# Patient Record
Sex: Male | Born: 1970
Health system: Southern US, Community
[De-identification: ages and names within clinical notes are randomized; demographics above are authoritative.]

## PROBLEM LIST (undated history)

## (undated) DIAGNOSIS — J939 Pneumothorax, unspecified: Secondary | ICD-10-CM

## (undated) DIAGNOSIS — I10 Essential (primary) hypertension: Secondary | ICD-10-CM

## (undated) DIAGNOSIS — W3400XA Accidental discharge from unspecified firearms or gun, initial encounter: Secondary | ICD-10-CM

## (undated) DIAGNOSIS — E785 Hyperlipidemia, unspecified: Secondary | ICD-10-CM

## (undated) DIAGNOSIS — L0292 Furuncle, unspecified: Secondary | ICD-10-CM

## (undated) DIAGNOSIS — K921 Melena: Secondary | ICD-10-CM

## (undated) DIAGNOSIS — E119 Type 2 diabetes mellitus without complications: Secondary | ICD-10-CM

## (undated) DIAGNOSIS — F1010 Alcohol abuse, uncomplicated: Secondary | ICD-10-CM

## (undated) HISTORY — DX: Hyperlipidemia, unspecified: E78.5

## (undated) HISTORY — PX: ABDOMINAL SURGERY: SHX537

## (undated) HISTORY — DX: Type 2 diabetes mellitus without complications: E11.9

## (undated) HISTORY — PX: INGUINAL HERNIA REPAIR: SHX194

## (undated) HISTORY — PX: HERNIA REPAIR: SHX51

---
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
## 1990-03-13 DIAGNOSIS — W3400XA Accidental discharge from unspecified firearms or gun, initial encounter: Secondary | ICD-10-CM

## 1990-03-13 HISTORY — DX: Accidental discharge from unspecified firearms or gun, initial encounter: W34.00XA

## 1997-08-25 ENCOUNTER — Emergency Department (HOSPITAL_COMMUNITY): Admission: EM | Admit: 1997-08-25 | Discharge: 1997-08-25 | Payer: Self-pay | Admitting: Emergency Medicine

## 1997-11-23 ENCOUNTER — Inpatient Hospital Stay (HOSPITAL_COMMUNITY): Admission: EM | Admit: 1997-11-23 | Discharge: 1997-11-27 | Payer: Self-pay | Admitting: Emergency Medicine

## 1999-11-18 ENCOUNTER — Encounter: Payer: Self-pay | Admitting: Emergency Medicine

## 1999-11-18 ENCOUNTER — Emergency Department (HOSPITAL_COMMUNITY): Admission: EM | Admit: 1999-11-18 | Discharge: 1999-11-18 | Payer: Self-pay

## 2004-07-26 ENCOUNTER — Emergency Department (HOSPITAL_COMMUNITY): Admission: EM | Admit: 2004-07-26 | Discharge: 2004-07-26 | Payer: Self-pay | Admitting: Emergency Medicine

## 2005-07-12 ENCOUNTER — Encounter: Payer: Self-pay | Admitting: Emergency Medicine

## 2005-07-12 ENCOUNTER — Encounter: Payer: Self-pay | Admitting: Orthopedic Surgery

## 2006-06-13 ENCOUNTER — Emergency Department (HOSPITAL_COMMUNITY): Admission: EM | Admit: 2006-06-13 | Discharge: 2006-06-13 | Payer: Self-pay | Admitting: Emergency Medicine

## 2006-06-29 ENCOUNTER — Emergency Department (HOSPITAL_COMMUNITY): Admission: EM | Admit: 2006-06-29 | Discharge: 2006-06-30 | Payer: Self-pay | Admitting: Emergency Medicine

## 2006-06-29 ENCOUNTER — Emergency Department (HOSPITAL_COMMUNITY): Admission: EM | Admit: 2006-06-29 | Discharge: 2006-06-29 | Payer: Self-pay | Admitting: Emergency Medicine

## 2006-07-18 ENCOUNTER — Emergency Department (HOSPITAL_COMMUNITY): Admission: EM | Admit: 2006-07-18 | Discharge: 2006-07-18 | Payer: Self-pay | Admitting: Emergency Medicine

## 2006-07-22 ENCOUNTER — Emergency Department (HOSPITAL_COMMUNITY): Admission: EM | Admit: 2006-07-22 | Discharge: 2006-07-22 | Payer: Self-pay | Admitting: Emergency Medicine

## 2006-07-23 ENCOUNTER — Emergency Department (HOSPITAL_COMMUNITY): Admission: EM | Admit: 2006-07-23 | Discharge: 2006-07-23 | Payer: Self-pay | Admitting: Emergency Medicine

## 2006-07-29 ENCOUNTER — Emergency Department (HOSPITAL_COMMUNITY): Admission: EM | Admit: 2006-07-29 | Discharge: 2006-07-30 | Payer: Self-pay | Admitting: Emergency Medicine

## 2006-09-02 ENCOUNTER — Emergency Department (HOSPITAL_COMMUNITY): Admission: EM | Admit: 2006-09-02 | Discharge: 2006-09-02 | Payer: Self-pay | Admitting: *Deleted

## 2006-10-02 ENCOUNTER — Emergency Department (HOSPITAL_COMMUNITY): Admission: EM | Admit: 2006-10-02 | Discharge: 2006-10-02 | Payer: Self-pay | Admitting: Emergency Medicine

## 2006-12-17 ENCOUNTER — Emergency Department (HOSPITAL_COMMUNITY): Admission: EM | Admit: 2006-12-17 | Discharge: 2006-12-17 | Payer: Self-pay | Admitting: Emergency Medicine

## 2007-08-04 ENCOUNTER — Emergency Department (HOSPITAL_COMMUNITY): Admission: EM | Admit: 2007-08-04 | Discharge: 2007-08-04 | Payer: Self-pay | Admitting: Emergency Medicine

## 2009-03-13 DIAGNOSIS — L0292 Furuncle, unspecified: Secondary | ICD-10-CM

## 2009-03-13 HISTORY — DX: Furuncle, unspecified: L02.92

## 2009-03-19 IMAGING — CR DG CHEST 2V
2 series · 2 of 2 positions shown · non-contrast
Comparison: 06/29/06.

CLINICAL DATA: Anxiety. 
 CHEST ? 2 VIEW:

[w chest pa]
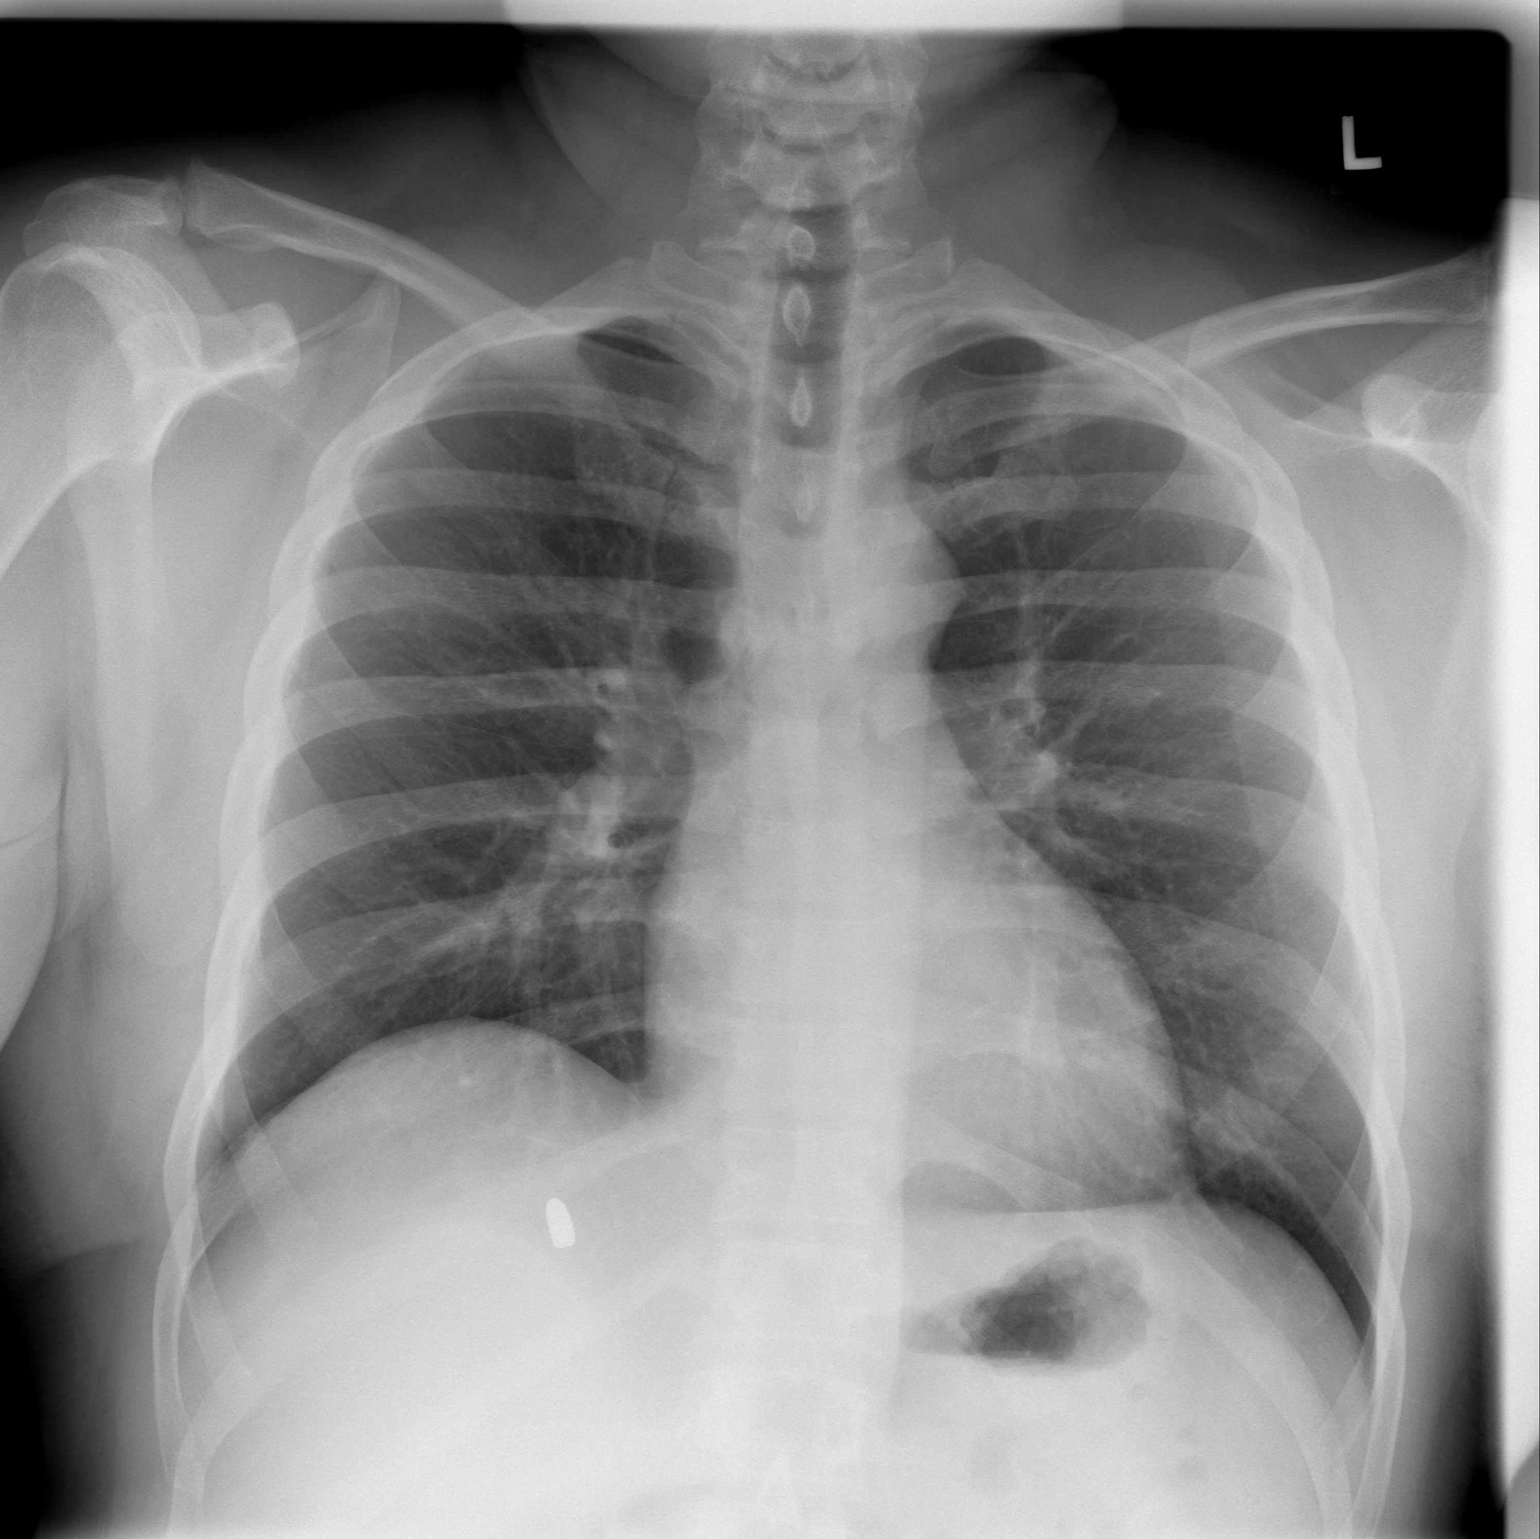

[w chest lat]
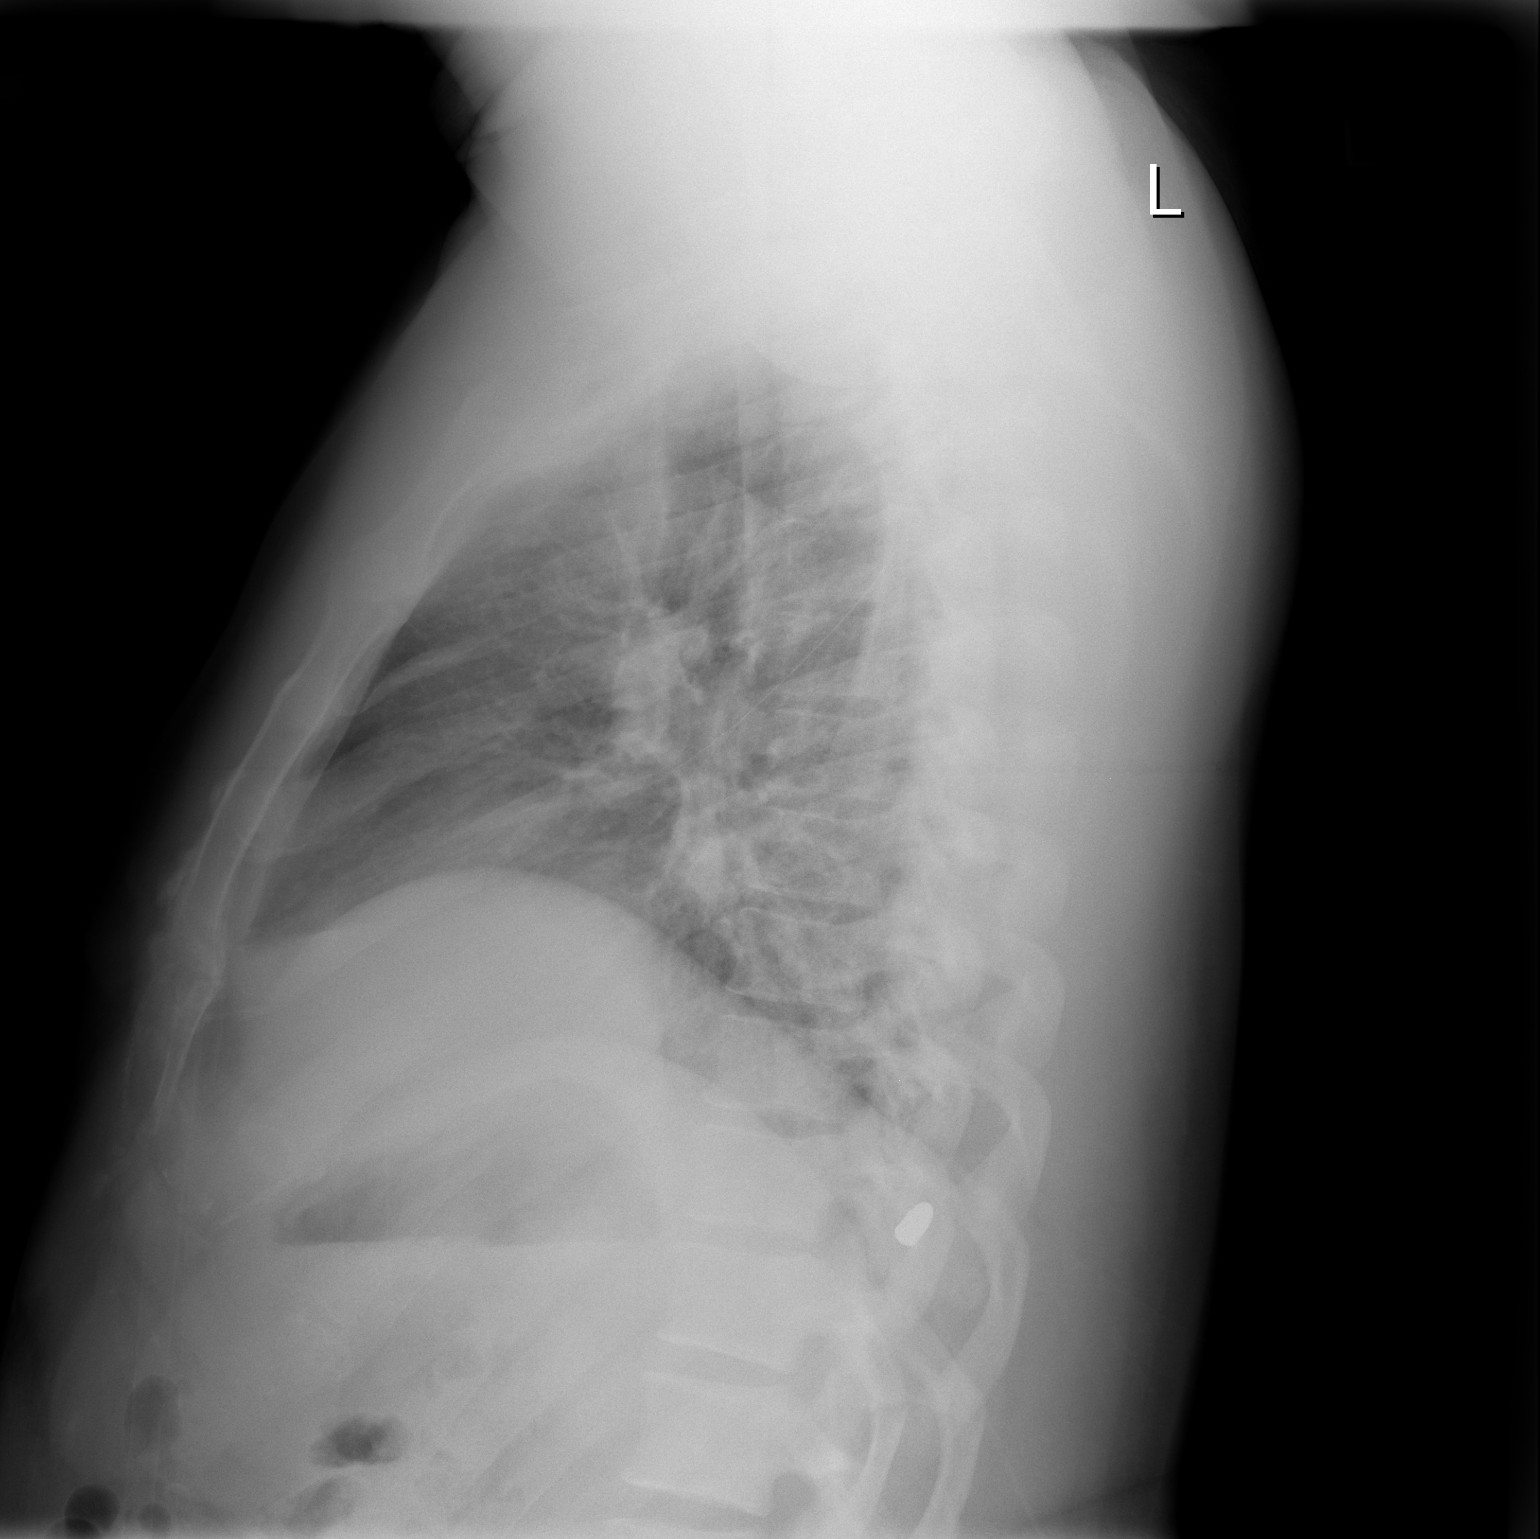

[2 of 2 positions shown; findings below may reference images not displayed]

FINDINGS: Cardiomediastinal silhouette is unremarkable.  Mild peribronchial thickening and elevation of the right hemidiaphragm is stable.  Bullet within the posterior right upper back noted.  Bony thorax is within normal limits.
IMPRESSION: No evidence of acute cardiopulmonary disease.

## 2009-03-28 ENCOUNTER — Ambulatory Visit: Payer: Self-pay | Admitting: Diagnostic Radiology

## 2009-03-28 ENCOUNTER — Emergency Department (HOSPITAL_BASED_OUTPATIENT_CLINIC_OR_DEPARTMENT_OTHER): Admission: EM | Admit: 2009-03-28 | Discharge: 2009-03-28 | Payer: Self-pay | Admitting: Emergency Medicine

## 2009-04-03 ENCOUNTER — Ambulatory Visit: Payer: Self-pay | Admitting: Diagnostic Radiology

## 2009-04-03 ENCOUNTER — Encounter: Payer: Self-pay | Admitting: Emergency Medicine

## 2009-04-03 ENCOUNTER — Ambulatory Visit (HOSPITAL_COMMUNITY): Admission: EM | Admit: 2009-04-03 | Discharge: 2009-04-05 | Payer: Self-pay | Admitting: Emergency Medicine

## 2009-05-16 ENCOUNTER — Emergency Department (HOSPITAL_BASED_OUTPATIENT_CLINIC_OR_DEPARTMENT_OTHER): Admission: EM | Admit: 2009-05-16 | Discharge: 2009-05-16 | Payer: Self-pay | Admitting: Emergency Medicine

## 2009-07-18 ENCOUNTER — Emergency Department (HOSPITAL_BASED_OUTPATIENT_CLINIC_OR_DEPARTMENT_OTHER): Admission: EM | Admit: 2009-07-18 | Discharge: 2009-07-18 | Payer: Self-pay | Admitting: Emergency Medicine

## 2010-05-29 LAB — ANAEROBIC CULTURE

## 2010-05-29 LAB — CULTURE, ROUTINE-ABSCESS

## 2010-05-30 LAB — DIFFERENTIAL
Eosinophils Absolute: 0.2 10*3/uL (ref 0.0–0.7)
Eosinophils Relative: 1 % (ref 0–5)
Eosinophils Relative: 1 % (ref 0–5)
Lymphocytes Relative: 29 % (ref 12–46)
Lymphs Abs: 1.6 10*3/uL (ref 0.7–4.0)
Lymphs Abs: 1.9 10*3/uL (ref 0.7–4.0)
Monocytes Relative: 10 % (ref 3–12)
Monocytes Relative: 14 % — ABNORMAL HIGH (ref 3–12)

## 2010-05-30 LAB — CBC
HCT: 41.5 % (ref 39.0–52.0)
HCT: 45.6 % (ref 39.0–52.0)
Hemoglobin: 15.7 g/dL (ref 13.0–17.0)
MCV: 93.7 fL (ref 78.0–100.0)
Platelets: 251 10*3/uL (ref 150–400)
RBC: 4.43 MIL/uL (ref 4.22–5.81)
RBC: 4.81 MIL/uL (ref 4.22–5.81)
WBC: 11 10*3/uL — ABNORMAL HIGH (ref 4.0–10.5)
WBC: 5.7 10*3/uL (ref 4.0–10.5)

## 2010-05-30 LAB — CULTURE, ROUTINE-ABSCESS

## 2010-05-30 LAB — URINALYSIS, ROUTINE W REFLEX MICROSCOPIC
Glucose, UA: NEGATIVE mg/dL
Hgb urine dipstick: NEGATIVE
Ketones, ur: NEGATIVE mg/dL
pH: 6 (ref 5.0–8.0)

## 2010-05-30 LAB — BASIC METABOLIC PANEL
BUN: 10 mg/dL (ref 6–23)
Chloride: 99 mEq/L (ref 96–112)
Chloride: 99 mEq/L (ref 96–112)
GFR calc Af Amer: >60 mL/min (ref >60)
GFR calc Af Amer: >60 mL/min (ref >60)
GFR calc non Af Amer: >60 mL/min (ref >60)
Potassium: 3.5 mEq/L (ref 3.5–5.1)
Potassium: 3.9 mEq/L (ref 3.5–5.1)
Sodium: 140 mEq/L (ref 135–145)

## 2010-12-26 LAB — I-STAT 8, (EC8 V) (CONVERTED LAB)
Acid-base deficit: 1
Chloride: 105
HCT: 47
Operator id: 277751
Potassium: 3.3 — ABNORMAL LOW

## 2010-12-26 LAB — POCT I-STAT CREATININE: Operator id: 277751

## 2010-12-28 LAB — COMPREHENSIVE METABOLIC PANEL
Albumin: 4
Alkaline Phosphatase: 40
BUN: 9
Calcium: 9.2
Potassium: 3.8
Sodium: 138
Total Protein: 7.2

## 2010-12-28 LAB — RAPID URINE DRUG SCREEN, HOSP PERFORMED
Cocaine: NOT DETECTED
Tetrahydrocannabinol: NOT DETECTED

## 2010-12-28 LAB — ETHANOL

## 2011-10-08 ENCOUNTER — Encounter (HOSPITAL_BASED_OUTPATIENT_CLINIC_OR_DEPARTMENT_OTHER): Payer: Self-pay | Admitting: *Deleted

## 2011-10-08 ENCOUNTER — Emergency Department (HOSPITAL_BASED_OUTPATIENT_CLINIC_OR_DEPARTMENT_OTHER)
Admission: EM | Admit: 2011-10-08 | Discharge: 2011-10-08 | Disposition: A | Discharge status: Home / Self Care | Payer: Managed Care, Other (non HMO) | Attending: Emergency Medicine | Admitting: Emergency Medicine

## 2011-10-08 DIAGNOSIS — I10 Essential (primary) hypertension: Secondary | ICD-10-CM | POA: Insufficient documentation

## 2011-10-08 DIAGNOSIS — M7918 Myalgia, other site: Secondary | ICD-10-CM

## 2011-10-08 DIAGNOSIS — IMO0001 Reserved for inherently not codable concepts without codable children: Secondary | ICD-10-CM | POA: Insufficient documentation

## 2011-10-08 HISTORY — DX: Essential (primary) hypertension: I10

## 2011-10-08 LAB — BASIC METABOLIC PANEL
BUN: 7 mg/dL (ref 6–23)
CO2: 21 mEq/L (ref 19–32)
Chloride: 98 mEq/L (ref 96–112)
GFR calc non Af Amer: >90 mL/min (ref >90)
Glucose, Bld: 120 mg/dL — ABNORMAL HIGH (ref 70–99)
Potassium: 3.3 mEq/L — ABNORMAL LOW (ref 3.5–5.1)

## 2011-10-08 MED ORDER — IBUPROFEN 800 MG PO TABS
800.0000 mg | ORAL_TABLET | Freq: Once | ORAL | Status: AC
  Administered 2011-10-08: 800 mg via ORAL
  Filled 2011-10-08: qty 1

## 2011-10-08 MED ORDER — HYDROCHLOROTHIAZIDE 25 MG PO TABS
25.0000 mg | ORAL_TABLET | Freq: Every day | ORAL | Status: DC
  Administered 2011-10-08: 25 mg via ORAL
  Filled 2011-10-08: qty 1

## 2011-10-08 MED ORDER — CYCLOBENZAPRINE HCL 10 MG PO TABS
10.0000 mg | ORAL_TABLET | Freq: Once | ORAL | Status: AC
  Administered 2011-10-08: 10 mg via ORAL
  Filled 2011-10-08: qty 1

## 2011-10-08 MED ORDER — CYCLOBENZAPRINE HCL 10 MG PO TABS: 10.0000 mg | ORAL_TABLET | Freq: Three times a day (TID) | ORAL | Status: AC | PRN

## 2011-10-08 MED ORDER — OXYCODONE-ACETAMINOPHEN 5-325 MG PO TABS
2.0000 | ORAL_TABLET | Freq: Once | ORAL | Status: AC
  Administered 2011-10-08: 2 via ORAL
  Filled 2011-10-08: qty 2

## 2011-10-08 MED ORDER — OXYCODONE-ACETAMINOPHEN 5-325 MG PO TABS: ORAL_TABLET | ORAL | Status: DC

## 2011-10-08 MED ORDER — IBUPROFEN 800 MG PO TABS: 800.0000 mg | ORAL_TABLET | Freq: Three times a day (TID) | ORAL | Status: AC | PRN

## 2011-10-08 NOTE — ED Notes (Addendum)
Right lower back pain/ butt, started a few months ago. States pain radiates down his leg at times. Patient also states that he has "been drinking for days" including today.

## 2011-10-09 NOTE — ED Provider Notes (Signed)
History     CSN: [id number]  Arrival date & time 10/08/11  1147   First MD Initiated Contact with Patient 10/08/11 1217      Chief Complaint  Patient presents with  . Back Pain    (Consider location/radiation/quality/duration/timing/severity/associated sxs/prior treatment) HPI Patient is a 41 year old male with history of hypertension who presents today complaining of back pain. Patient has had this on and off for the past couple of months. He notes that his right lower back and radiates down into his right leg. He denies any obvious injury. Patient is very hypertensive on presentation with initial blood pressure of 197/114. He admits though that he has not been taking his blood pressure medication since his wife left him 7-8 weeks ago. Patient also reports he's not been taking care of himself and admits to using cocaine several days ago as well as drinking alcohol. He denies any chest pain, shortness breath, lightheadedness, or headaches. He denies suicidal or homicidal ideation though he endorses depression regarding his wife's situation. A friend who is at bedside confirms this. Patient also denies alcoholism or needing assistance with this today. Patient does have a primary care physician but he has not been following up with him since the changes in his home life. Patient denies any urinary symptoms including increased urinary frequency, dysuria, hematuria. He denies any GI or neurologic symptoms as well. Pain is weighed worse with certain movements. Nothing has made it better. There are no other associated or modifying factors. Patient reports the pain is about a 7/10. Past Medical History  Diagnosis Date  . Hypertension     History reviewed. No pertinent past surgical history.  History reviewed. No pertinent family history.  History  Substance Use Topics  . Smoking status: Never Smoker   . Smokeless tobacco: Not on file  . Alcohol Use: Yes      Review of Systems    Constitutional: Negative.   HENT: Negative.   Eyes: Negative.   Respiratory: Negative.   Cardiovascular: Negative.   Gastrointestinal: Negative.   Genitourinary: Negative.   Musculoskeletal: Positive for back pain.  Skin: Negative.   Neurological: Negative.   Hematological: Negative.   Psychiatric/Behavioral: Negative.   All other systems reviewed and are negative.    Allergies  Review of patient's allergies indicates no known allergies.  Home Medications   Current Outpatient Rx  Name Route Sig Dispense Refill  . CYCLOBENZAPRINE HCL 10 MG PO TABS Oral Take 1 tablet (10 mg total) by mouth 3 (three) times daily as needed for muscle spasms. 30 tablet 0  . IBUPROFEN 800 MG PO TABS Oral Take 1 tablet (800 mg total) by mouth every 8 (eight) hours as needed for pain. 30 tablet 0  . OXYCODONE-ACETAMINOPHEN 5-325 MG PO TABS  Take 1-2 tabs by mouth every 6 hours when necessary pain. 30 tablet 0    BP 199/105  Pulse 85  Temp 98.2 F (36.8 C) (Oral)  Resp 18  SpO2 99%  Physical Exam  Nursing note and vitals reviewed. GEN: Well-developed, well-nourished male in no distress HEENT: Atraumatic, normocephalic. Oropharynx clear without erythema EYES: PERRLA BL, no scleral icterus. NECK: Trachea midline, no meningismus CV: regular rate and rhythm. No murmurs, rubs, or gallops PULM: No respiratory distress.  No crackles, wheezes, or rales. GI: soft, non-tender. No guarding, rebound, or tenderness. + bowel sounds  GU: deferred Neuro: cranial nerves grossly 2-12 intact, no abnormalities of strength or sensation, A and O x 3 MSK: Patient moves all  4 extremities symmetrically, no deformity, edema. Tenderness to palpation in the right sacroiliac region as well as in the right lower lumbar paraspinal musculature. Skin: No rashes petechiae, purpura, or jaundice Psych: Denies suicidal or homicidal ideation. Denies hallucinations. Admits to depression but has been seeking resources for this.  Admits polysubstance abuse but denies frank alcoholism or desire for detox today.  ED Course  Procedures (including critical care time)  Labs Reviewed  BASIC METABOLIC PANEL - Abnormal; Notable for the following:    Potassium 3.3 (*)     Glucose, Bld 120 (*)     All other components within normal limits   No results found.   1. Musculoskeletal pain   2. Hypertension       MDM  Patient was evaluated by myself. Based on evaluation patient appeared to have musculoskeletal back pain. Blood pressure was elevated the patient has not been taking his blood pressure medications for months and has also been drinking and using cocaine. Patient absolutely no chest pain, shortness breath, or lightheadedness. He does have a primary care physician. Patient was treated here for his pain with ibuprofen, Flexeril, Percocet. Patient's initial tachycardia resolved. Patient is not a diabetic. He was given a dose of his blood pressure medication here. Patient has prescription with refills available at his pharmacy and just has not filled his medicine. He has the money to do so. Patient will followup with his primary care physician Dr. Renford Dills for further management. He was told to abstain from drinking alcohol and using cocaine. Patient did not endorse heavy drinking though he didn't worse on a daily basis. Given clinical appearance as well as lack of other symptoms I was comfortable with patient being discharged home. He was given prescriptions for ibuprofen Flexeril and Percocet which I was also comfortable giving him taste on our interaction today. Patient was told he we need to followup with his primary care physician. He was told to be honest about his recent substance abuse and is action as well as last time he took his blood pressure medicines. He will resume taking these and was discharged in good condition.        Cyndra Numbers, MD 10/09/11 [phone]

## 2012-01-07 ENCOUNTER — Emergency Department (HOSPITAL_BASED_OUTPATIENT_CLINIC_OR_DEPARTMENT_OTHER): Payer: Managed Care, Other (non HMO)

## 2012-01-07 ENCOUNTER — Encounter (HOSPITAL_BASED_OUTPATIENT_CLINIC_OR_DEPARTMENT_OTHER): Payer: Self-pay

## 2012-01-07 ENCOUNTER — Emergency Department (HOSPITAL_BASED_OUTPATIENT_CLINIC_OR_DEPARTMENT_OTHER)
Admission: EM | Admit: 2012-01-07 | Discharge: 2012-01-07 | Disposition: A | Discharge status: Home / Self Care | Payer: Managed Care, Other (non HMO) | Attending: Emergency Medicine | Admitting: Emergency Medicine

## 2012-01-07 DIAGNOSIS — G8929 Other chronic pain: Secondary | ICD-10-CM | POA: Insufficient documentation

## 2012-01-07 DIAGNOSIS — M5430 Sciatica, unspecified side: Secondary | ICD-10-CM | POA: Insufficient documentation

## 2012-01-07 DIAGNOSIS — I10 Essential (primary) hypertension: Secondary | ICD-10-CM | POA: Insufficient documentation

## 2012-01-07 DIAGNOSIS — Z79899 Other long term (current) drug therapy: Secondary | ICD-10-CM | POA: Insufficient documentation

## 2012-01-07 LAB — URINALYSIS, ROUTINE W REFLEX MICROSCOPIC
Ketones, ur: NEGATIVE mg/dL
Leukocytes, UA: NEGATIVE
Nitrite: NEGATIVE
Protein, ur: NEGATIVE mg/dL

## 2012-01-07 MED ORDER — METHOCARBAMOL 500 MG PO TABS
1000.0000 mg | ORAL_TABLET | Freq: Once | ORAL | Status: AC
  Administered 2012-01-07: 1000 mg via ORAL
  Filled 2012-01-07: qty 2

## 2012-01-07 MED ORDER — TRAMADOL HCL 50 MG PO TABS
50.0000 mg | ORAL_TABLET | Freq: Once | ORAL | Status: AC
  Administered 2012-01-07: 50 mg via ORAL
  Filled 2012-01-07: qty 1

## 2012-01-07 MED ORDER — METHOCARBAMOL 500 MG PO TABS: 500.0000 mg | ORAL_TABLET | Freq: Two times a day (BID) | ORAL | Status: DC

## 2012-01-07 MED ORDER — KETOROLAC TROMETHAMINE 60 MG/2ML IM SOLN
60.0000 mg | Freq: Once | INTRAMUSCULAR | Status: AC
  Administered 2012-01-07: 60 mg via INTRAMUSCULAR
  Filled 2012-01-07: qty 2

## 2012-01-07 MED ORDER — OXYCODONE-ACETAMINOPHEN 5-325 MG PO TABS: 1.0000 | ORAL_TABLET | Freq: Four times a day (QID) | ORAL | Status: DC | PRN

## 2012-01-07 MED ORDER — DEXAMETHASONE SODIUM PHOSPHATE 10 MG/ML IJ SOLN
10.0000 mg | Freq: Once | INTRAMUSCULAR | Status: AC
  Administered 2012-01-07: 10 mg via INTRAMUSCULAR
  Filled 2012-01-07: qty 1

## 2012-01-07 NOTE — ED Notes (Signed)
Patient here with increasing lower backpain x 2 days, reports pain radiating down both legs. Reports chronic pain but worse today, denies new injury

## 2012-01-07 NOTE — ED Provider Notes (Addendum)
History  This chart was scribed for Mark Azzaro Smitty Cords, MD by Ladona Ridgel Day. This patient was seen in room MH10/MH10 and the patient's care was started at 0010.   CSN: [id number]  Arrival date & time 01/07/12  0010   First MD Initiated Contact with Patient 01/07/12 0019      Chief Complaint  Patient presents with  . Back Pain   Patient is a 41 y.o. male presenting with back pain. The history is provided by the patient. No language interpreter was used.  Back Pain  This is a chronic problem. The current episode started 2 days ago. The problem occurs constantly. The problem has been gradually worsening. The pain is associated with no known injury. The pain is present in the lumbar spine. The quality of the pain is described as aching and stabbing. The pain radiates to the left thigh and right thigh. The pain is moderate. The symptoms are aggravated by bending. The pain is the same all the time. Pertinent negatives include no chest pain, no fever, no numbness, no weight loss, no headaches, no abdominal pain, no abdominal swelling, no bowel incontinence, no perianal numbness, no bladder incontinence, no pelvic pain, no leg pain, no paresthesias, no paresis and no weakness. He has tried nothing for the symptoms. The treatment provided no relief. Risk factors include obesity.   Mark Rich is a 41 y.o. male who presents to the Emergency Department w/hx of lower back pain complaining of exacerbated lower back pain over the past 2 days. He states his lumbar back pain radiates to both of his legs. He states he was seen here 4 weeks ago and D/C with pain medicine that gave him temporary relief. He states also scheduled with PCP Dr. Nehemiah Settle on Tuesday for this problem. He denies any other pain over his body.  He also c/o of dysuria only in chlorinated water but denies D/C or any new sexual partners.   Past Medical History  Diagnosis Date  . Hypertension     History reviewed. No pertinent past  surgical history.  No family history on file.  History  Substance Use Topics  . Smoking status: Never Smoker   . Smokeless tobacco: Not on file  . Alcohol Use: Yes      Review of Systems  Constitutional: Negative for fever, chills and weight loss.  Respiratory: Negative for shortness of breath.   Cardiovascular: Negative for chest pain.  Gastrointestinal: Negative for nausea, vomiting, abdominal pain and bowel incontinence.  Genitourinary: Negative for bladder incontinence and pelvic pain.  Musculoskeletal: Positive for back pain.  Neurological: Negative for weakness, numbness, headaches and paresthesias.  All other systems reviewed and are negative.    Allergies  Review of patient's allergies indicates no known allergies.  Home Medications   Current Outpatient Rx  Name Route Sig Dispense Refill  . CYCLOBENZAPRINE HCL 10 MG PO TABS Oral Take 10 mg by mouth 3 (three) times daily as needed.    Marland Kitchen HYDROCHLOROTHIAZIDE 25 MG PO TABS Oral Take 25 mg by mouth daily.    Marland Kitchen LISINOPRIL 20 MG PO TABS Oral Take 20 mg by mouth daily.    . OXYCODONE-ACETAMINOPHEN 5-325 MG PO TABS  Take 1-2 tabs by mouth every 6 hours when necessary pain. 30 tablet 0    Triage Vitals: BP 195/113  Pulse 104  Temp 99 F (37.2 C)  Resp 18  SpO2 100%  Physical Exam  Nursing note and vitals reviewed. Constitutional: He is oriented to person, place,  and time. He appears well-developed and well-nourished. No distress.  HENT:  Head: Normocephalic and atraumatic.  Mouth/Throat: Oropharynx is clear and moist.  Eyes: EOM are normal. Pupils are equal, round, and reactive to light.  Neck: Neck supple. No tracheal deviation present.       No C-spine tenderness, step offs crepitance, or bony tenderness  Cardiovascular: Normal rate, regular rhythm and normal heart sounds.   No murmur heard. Pulmonary/Chest: Effort normal and breath sounds normal. No respiratory distress. He has no wheezes. He has no rales.    Abdominal: Soft. Bowel sounds are normal. He exhibits no distension. There is no tenderness. There is no rebound and no guarding.  Musculoskeletal: Normal range of motion.  Neurological: He is alert and oriented to person, place, and time. He displays normal reflexes.       L5/S1 intact, intact perineal sensation. 5/5 motor strength BUE/BLE     Skin: Skin is warm and dry.  Psychiatric: He has a normal mood and affect. His behavior is normal.    ED Course  Procedures (including critical care time) DIAGNOSTIC STUDIES: Oxygen Saturation is 100% on room air, normal by my interpretation.    COORDINATION OF CARE: At 1230 AM Discussed treatment plan with patient which includes toradol, ultram, decardon, robaxin, lumbar spine X-ray, UA. Patient agrees.   Labs Reviewed - No data to display No results found.   No diagnosis found.    MDM  No indication for advanced imaging will treat for pain. Follow up with family doctor for ongoing care.        I personally performed the services described in this documentation, which was scribed in my presence. The recorded information has been reviewed and considered.    Jasmine Awe, MD 01/07/12 0330  [name] K Daran Favaro-Rasch, MD 01/07/12 [phone]

## 2012-01-09 ENCOUNTER — Other Ambulatory Visit: Payer: Self-pay | Admitting: Internal Medicine

## 2012-01-09 ENCOUNTER — Ambulatory Visit (HOSPITAL_COMMUNITY)
Admission: RE | Admit: 2012-01-09 | Discharge: 2012-01-09 | Disposition: A | Discharge status: Home / Self Care | Payer: Managed Care, Other (non HMO) | Source: Ambulatory Visit | Attending: Internal Medicine | Admitting: Internal Medicine

## 2012-01-09 DIAGNOSIS — M545 Low back pain, unspecified: Secondary | ICD-10-CM | POA: Insufficient documentation

## 2012-01-09 DIAGNOSIS — M5126 Other intervertebral disc displacement, lumbar region: Secondary | ICD-10-CM | POA: Insufficient documentation

## 2012-01-09 DIAGNOSIS — M51379 Other intervertebral disc degeneration, lumbosacral region without mention of lumbar back pain or lower extremity pain: Secondary | ICD-10-CM | POA: Insufficient documentation

## 2012-01-09 DIAGNOSIS — R209 Unspecified disturbances of skin sensation: Secondary | ICD-10-CM | POA: Insufficient documentation

## 2012-01-09 DIAGNOSIS — R29898 Other symptoms and signs involving the musculoskeletal system: Secondary | ICD-10-CM | POA: Insufficient documentation

## 2012-01-09 DIAGNOSIS — D1779 Benign lipomatous neoplasm of other sites: Secondary | ICD-10-CM | POA: Insufficient documentation

## 2012-01-09 DIAGNOSIS — M5137 Other intervertebral disc degeneration, lumbosacral region: Secondary | ICD-10-CM | POA: Insufficient documentation

## 2012-01-11 ENCOUNTER — Other Ambulatory Visit: Payer: Managed Care, Other (non HMO)

## 2012-03-14 ENCOUNTER — Other Ambulatory Visit: Payer: Self-pay | Admitting: Neurosurgery

## 2012-03-28 ENCOUNTER — Encounter (HOSPITAL_COMMUNITY): Payer: Self-pay | Admitting: *Deleted

## 2012-03-28 MED ORDER — CEFAZOLIN SODIUM-DEXTROSE 2-3 GM-% IV SOLR: 2.0000 g | INTRAVENOUS | Status: DC

## 2012-03-29 ENCOUNTER — Encounter (HOSPITAL_COMMUNITY): Payer: Self-pay | Admitting: Anesthesiology

## 2012-03-29 ENCOUNTER — Encounter (HOSPITAL_COMMUNITY): Payer: Self-pay | Admitting: *Deleted

## 2012-03-29 ENCOUNTER — Inpatient Hospital Stay (HOSPITAL_COMMUNITY): Payer: Managed Care, Other (non HMO)

## 2012-03-29 ENCOUNTER — Encounter (HOSPITAL_COMMUNITY)
Admission: RE | Disposition: A | Discharge status: Home / Self Care | Payer: Self-pay | Source: Ambulatory Visit | Attending: Neurosurgery

## 2012-03-29 ENCOUNTER — Ambulatory Visit (HOSPITAL_COMMUNITY): Payer: Managed Care, Other (non HMO)

## 2012-03-29 ENCOUNTER — Inpatient Hospital Stay (HOSPITAL_COMMUNITY)
Admission: RE | Admit: 2012-03-29 | Discharge: 2012-04-04 | DRG: 491 | Disposition: A | Discharge status: Home Health | Payer: Managed Care, Other (non HMO) | Source: Ambulatory Visit | Attending: Neurosurgery | Admitting: Neurosurgery

## 2012-03-29 ENCOUNTER — Ambulatory Visit (HOSPITAL_COMMUNITY): Payer: Managed Care, Other (non HMO) | Admitting: Anesthesiology

## 2012-03-29 DIAGNOSIS — I10 Essential (primary) hypertension: Secondary | ICD-10-CM | POA: Diagnosis present

## 2012-03-29 DIAGNOSIS — Z6838 Body mass index (BMI) 38.0-38.9, adult: Secondary | ICD-10-CM

## 2012-03-29 DIAGNOSIS — E669 Obesity, unspecified: Secondary | ICD-10-CM | POA: Diagnosis present

## 2012-03-29 DIAGNOSIS — Z23 Encounter for immunization: Secondary | ICD-10-CM

## 2012-03-29 DIAGNOSIS — M5126 Other intervertebral disc displacement, lumbar region: Principal | ICD-10-CM | POA: Diagnosis present

## 2012-03-29 DIAGNOSIS — Z79899 Other long term (current) drug therapy: Secondary | ICD-10-CM

## 2012-03-29 HISTORY — DX: Furuncle, unspecified: L02.92

## 2012-03-29 HISTORY — PX: LUMBAR LAMINECTOMY/DECOMPRESSION MICRODISCECTOMY: SHX5026

## 2012-03-29 HISTORY — DX: Pneumothorax, unspecified: J93.9

## 2012-03-29 HISTORY — DX: Melena: K92.1

## 2012-03-29 HISTORY — DX: Accidental discharge from unspecified firearms or gun, initial encounter: W34.00XA

## 2012-03-29 LAB — BASIC METABOLIC PANEL
GFR calc Af Amer: >90 mL/min (ref >90)
GFR calc non Af Amer: >90 mL/min (ref >90)
Potassium: 3.9 mEq/L (ref 3.5–5.1)
Sodium: 136 mEq/L (ref 135–145)

## 2012-03-29 LAB — CBC
Hemoglobin: 16.5 g/dL (ref 13.0–17.0)
MCHC: 35.6 g/dL (ref 30.0–36.0)
RDW: 13.7 % (ref 11.5–15.5)

## 2012-03-29 SURGERY — LUMBAR LAMINECTOMY/DECOMPRESSION MICRODISCECTOMY 3 LEVELS
Anesthesia: General | Site: Back | Wound class: Clean

## 2012-03-29 MED ORDER — MENTHOL 3 MG MT LOZG: 1.0000 | LOZENGE | OROMUCOSAL | Status: DC | PRN

## 2012-03-29 MED ORDER — ZOLPIDEM TARTRATE 5 MG PO TABS: 10.0000 mg | ORAL_TABLET | Freq: Every evening | ORAL | Status: DC | PRN

## 2012-03-29 MED ORDER — OXYCODONE HCL 5 MG/5ML PO SOLN: 5.0000 mg | Freq: Once | ORAL | Status: AC | PRN

## 2012-03-29 MED ORDER — DIAZEPAM 5 MG PO TABS
10.0000 mg | ORAL_TABLET | Freq: Four times a day (QID) | ORAL | Status: DC | PRN
  Administered 2012-03-29 – 2012-04-04 (×4): 10 mg via ORAL
  Filled 2012-03-29: qty 1
  Filled 2012-03-29: qty 2
  Filled 2012-03-29: qty 1
  Filled 2012-03-29 (×2): qty 2

## 2012-03-29 MED ORDER — DEXTROSE 5 % IV SOLN
3.0000 g | Freq: Once | INTRAVENOUS | Status: AC
  Administered 2012-03-29: 3 g via INTRAVENOUS
  Filled 2012-03-29 (×2): qty 3000

## 2012-03-29 MED ORDER — MORPHINE SULFATE (PF) 1 MG/ML IV SOLN
INTRAVENOUS | Status: DC
  Administered 2012-03-29: 12:00:00 via INTRAVENOUS
  Administered 2012-03-29: 4.5 mg via INTRAVENOUS
  Administered 2012-03-30: 6 mg via INTRAVENOUS
  Administered 2012-03-30: 12.6 mg via INTRAVENOUS
  Administered 2012-03-30: 1 mg via INTRAVENOUS
  Administered 2012-03-30: 3 mg via INTRAVENOUS
  Administered 2012-03-30: 01:00:00 via INTRAVENOUS
  Administered 2012-03-31: 5 mg via INTRAVENOUS
  Administered 2012-03-31: 1.5 mg via INTRAVENOUS
  Administered 2012-03-31: 6 mg via INTRAVENOUS
  Filled 2012-03-29 (×2): qty 25

## 2012-03-29 MED ORDER — HYDROMORPHONE HCL PF 1 MG/ML IJ SOLN
INTRAMUSCULAR | Status: AC
  Filled 2012-03-29: qty 1

## 2012-03-29 MED ORDER — CEFAZOLIN SODIUM-DEXTROSE 2-3 GM-% IV SOLR
INTRAVENOUS | Status: AC
  Filled 2012-03-29: qty 50

## 2012-03-29 MED ORDER — SODIUM CHLORIDE 0.9 % IJ SOLN: 3.0000 mL | INTRAMUSCULAR | Status: DC | PRN

## 2012-03-29 MED ORDER — PHENYLEPHRINE HCL 10 MG/ML IJ SOLN
10.0000 mg | INTRAVENOUS | Status: DC | PRN
  Administered 2012-03-29: 20 ug/min via INTRAVENOUS

## 2012-03-29 MED ORDER — MORPHINE SULFATE (PF) 1 MG/ML IV SOLN
INTRAVENOUS | Status: AC
  Filled 2012-03-29: qty 25

## 2012-03-29 MED ORDER — SODIUM CHLORIDE 0.9 % IV SOLN
250.0000 mL | INTRAVENOUS | Status: DC
  Administered 2012-03-29: 250 mL via INTRAVENOUS

## 2012-03-29 MED ORDER — NALOXONE HCL 0.4 MG/ML IJ SOLN: 0.4000 mg | INTRAMUSCULAR | Status: DC | PRN

## 2012-03-29 MED ORDER — THROMBIN 5000 UNITS EX SOLR
CUTANEOUS | Status: DC | PRN
  Administered 2012-03-29 (×2): 5000 [IU] via TOPICAL

## 2012-03-29 MED ORDER — OXYCODONE-ACETAMINOPHEN 5-325 MG PO TABS
1.0000 | ORAL_TABLET | ORAL | Status: DC | PRN
  Administered 2012-03-29 – 2012-04-01 (×4): 2 via ORAL
  Filled 2012-03-29 (×4): qty 2

## 2012-03-29 MED ORDER — OXYCODONE HCL 5 MG PO TABS
ORAL_TABLET | ORAL | Status: AC
  Filled 2012-03-29: qty 1

## 2012-03-29 MED ORDER — LISINOPRIL 20 MG PO TABS
20.0000 mg | ORAL_TABLET | Freq: Every day | ORAL | Status: DC
  Administered 2012-03-29 – 2012-04-04 (×7): 20 mg via ORAL
  Filled 2012-03-29 (×7): qty 1

## 2012-03-29 MED ORDER — DIPHENHYDRAMINE HCL 12.5 MG/5ML PO ELIX: 12.5000 mg | ORAL_SOLUTION | Freq: Four times a day (QID) | ORAL | Status: DC | PRN

## 2012-03-29 MED ORDER — ACETAMINOPHEN 650 MG RE SUPP: 650.0000 mg | RECTAL | Status: DC | PRN

## 2012-03-29 MED ORDER — SODIUM CHLORIDE 0.9 % IJ SOLN
3.0000 mL | Freq: Two times a day (BID) | INTRAMUSCULAR | Status: DC
  Administered 2012-03-29 – 2012-04-04 (×5): 3 mL via INTRAVENOUS

## 2012-03-29 MED ORDER — ACETAMINOPHEN 325 MG PO TABS: 650.0000 mg | ORAL_TABLET | ORAL | Status: DC | PRN

## 2012-03-29 MED ORDER — PROMETHAZINE HCL 25 MG/ML IJ SOLN: 6.2500 mg | INTRAMUSCULAR | Status: DC | PRN

## 2012-03-29 MED ORDER — FENTANYL CITRATE 0.05 MG/ML IJ SOLN
INTRAMUSCULAR | Status: DC | PRN
  Administered 2012-03-29: 50 ug via INTRAVENOUS
  Administered 2012-03-29: 200 ug via INTRAVENOUS

## 2012-03-29 MED ORDER — DIPHENHYDRAMINE HCL 50 MG/ML IJ SOLN: 12.5000 mg | Freq: Four times a day (QID) | INTRAMUSCULAR | Status: DC | PRN

## 2012-03-29 MED ORDER — LISINOPRIL-HYDROCHLOROTHIAZIDE 20-25 MG PO TABS: 1.0000 | ORAL_TABLET | Freq: Every day | ORAL | Status: DC

## 2012-03-29 MED ORDER — PROPOFOL 10 MG/ML IV BOLUS
INTRAVENOUS | Status: DC | PRN
  Administered 2012-03-29: 300 mg via INTRAVENOUS

## 2012-03-29 MED ORDER — BUPIVACAINE LIPOSOME 1.3 % IJ SUSP
20.0000 mL | INTRAMUSCULAR | Status: AC
  Administered 2012-03-29: 20 mL
  Filled 2012-03-29: qty 20

## 2012-03-29 MED ORDER — OXYCODONE HCL 5 MG PO TABS
5.0000 mg | ORAL_TABLET | Freq: Once | ORAL | Status: AC | PRN
  Administered 2012-03-29: 5 mg via ORAL

## 2012-03-29 MED ORDER — GLYCOPYRROLATE 0.2 MG/ML IJ SOLN
INTRAMUSCULAR | Status: DC | PRN
  Administered 2012-03-29: 0.4 mg via INTRAVENOUS

## 2012-03-29 MED ORDER — HYDROCHLOROTHIAZIDE 25 MG PO TABS
25.0000 mg | ORAL_TABLET | Freq: Every day | ORAL | Status: DC
  Administered 2012-03-29 – 2012-04-04 (×7): 25 mg via ORAL
  Filled 2012-03-29 (×7): qty 1

## 2012-03-29 MED ORDER — ONDANSETRON HCL 4 MG/2ML IJ SOLN
INTRAMUSCULAR | Status: DC | PRN
  Administered 2012-03-29: 4 mg via INTRAVENOUS

## 2012-03-29 MED ORDER — LIDOCAINE HCL (CARDIAC) 20 MG/ML IV SOLN
INTRAVENOUS | Status: DC | PRN
  Administered 2012-03-29: 100 mg via INTRAVENOUS

## 2012-03-29 MED ORDER — CEFAZOLIN SODIUM 1-5 GM-% IV SOLN
INTRAVENOUS | Status: AC
  Filled 2012-03-29: qty 50

## 2012-03-29 MED ORDER — ROCURONIUM BROMIDE 100 MG/10ML IV SOLN
INTRAVENOUS | Status: DC | PRN
  Administered 2012-03-29: 10 mg via INTRAVENOUS
  Administered 2012-03-29: 50 mg via INTRAVENOUS

## 2012-03-29 MED ORDER — PHENYLEPHRINE HCL 10 MG/ML IJ SOLN
INTRAMUSCULAR | Status: DC | PRN
  Administered 2012-03-29 (×5): 120 ug via INTRAVENOUS

## 2012-03-29 MED ORDER — CEFAZOLIN SODIUM 1-5 GM-% IV SOLN
1.0000 g | Freq: Three times a day (TID) | INTRAVENOUS | Status: AC
  Administered 2012-03-29 – 2012-03-30 (×2): 1 g via INTRAVENOUS
  Filled 2012-03-29 (×2): qty 50

## 2012-03-29 MED ORDER — ONDANSETRON HCL 4 MG/2ML IJ SOLN
4.0000 mg | Freq: Four times a day (QID) | INTRAMUSCULAR | Status: DC | PRN
  Administered 2012-03-30: 4 mg via INTRAVENOUS
  Filled 2012-03-29 (×2): qty 2

## 2012-03-29 MED ORDER — LACTATED RINGERS IV SOLN
INTRAVENOUS | Status: DC | PRN
  Administered 2012-03-29 (×3): via INTRAVENOUS

## 2012-03-29 MED ORDER — INFLUENZA VIRUS VACC SPLIT PF IM SUSP
0.5000 mL | INTRAMUSCULAR | Status: AC
  Administered 2012-03-30: 0.5 mL via INTRAMUSCULAR
  Filled 2012-03-29: qty 0.5

## 2012-03-29 MED ORDER — MIDAZOLAM HCL 5 MG/5ML IJ SOLN
INTRAMUSCULAR | Status: DC | PRN
  Administered 2012-03-29: 2 mg via INTRAVENOUS

## 2012-03-29 MED ORDER — 0.9 % SODIUM CHLORIDE (POUR BTL) OPTIME
TOPICAL | Status: DC | PRN
  Administered 2012-03-29: 1000 mL

## 2012-03-29 MED ORDER — ONDANSETRON HCL 4 MG/2ML IJ SOLN: 4.0000 mg | INTRAMUSCULAR | Status: DC | PRN

## 2012-03-29 MED ORDER — HYDROMORPHONE HCL PF 1 MG/ML IJ SOLN
0.2500 mg | INTRAMUSCULAR | Status: DC | PRN
  Administered 2012-03-29 (×4): 0.5 mg via INTRAVENOUS

## 2012-03-29 MED ORDER — ARTIFICIAL TEARS OP OINT
TOPICAL_OINTMENT | OPHTHALMIC | Status: DC | PRN
  Administered 2012-03-29: 1 via OPHTHALMIC

## 2012-03-29 MED ORDER — SODIUM CHLORIDE 0.9 % IJ SOLN: 9.0000 mL | INTRAMUSCULAR | Status: DC | PRN

## 2012-03-29 MED ORDER — MUPIROCIN 2 % EX OINT
TOPICAL_OINTMENT | Freq: Once | CUTANEOUS | Status: AC
  Administered 2012-03-29: 1 via NASAL
  Filled 2012-03-29: qty 22

## 2012-03-29 MED ORDER — SODIUM CHLORIDE 0.9 % IV SOLN
INTRAVENOUS | Status: DC
  Administered 2012-03-29 – 2012-04-01 (×7): via INTRAVENOUS

## 2012-03-29 MED ORDER — NEOSTIGMINE METHYLSULFATE 1 MG/ML IJ SOLN
INTRAMUSCULAR | Status: DC | PRN
  Administered 2012-03-29: 3 mg via INTRAVENOUS

## 2012-03-29 MED ORDER — HEMOSTATIC AGENTS (NO CHARGE) OPTIME
TOPICAL | Status: DC | PRN
  Administered 2012-03-29: 1 via TOPICAL

## 2012-03-29 MED ORDER — PHENOL 1.4 % MT LIQD: 1.0000 | OROMUCOSAL | Status: DC | PRN

## 2012-03-29 SURGICAL SUPPLY — 59 items
BENZOIN TINCTURE PRP APPL 2/3 (GAUZE/BANDAGES/DRESSINGS) ×2 IMPLANT
BLADE SURG ROTATE 9660 (MISCELLANEOUS) IMPLANT
BUR ACORN 6.0 (BURR) IMPLANT
BUR EGG ELITE 5.0 (BURR) ×2 IMPLANT
BUR MATCHSTICK NEURO 3.0 LAGG (BURR) ×2 IMPLANT
CANISTER SUCTION 2500CC (MISCELLANEOUS) ×2 IMPLANT
CLOTH BEACON ORANGE TIMEOUT ST (SAFETY) ×2 IMPLANT
CONT SPEC 4OZ CLIKSEAL STRL BL (MISCELLANEOUS) ×4 IMPLANT
DRAPE LAPAROTOMY 100X72X124 (DRAPES) ×2 IMPLANT
DRAPE MICROSCOPE LEICA (MISCELLANEOUS) ×2 IMPLANT
DRAPE POUCH INSTRU U-SHP 10X18 (DRAPES) ×2 IMPLANT
DRSG PAD ABDOMINAL 8X10 ST (GAUZE/BANDAGES/DRESSINGS) ×2 IMPLANT
DURAPREP 26ML APPLICATOR (WOUND CARE) ×2 IMPLANT
DURASEAL SPINE SEALANT 3ML (MISCELLANEOUS) ×2 IMPLANT
ELECT BLADE 4.0 EZ CLEAN MEGAD (MISCELLANEOUS) ×2
ELECT REM PT RETURN 9FT ADLT (ELECTROSURGICAL) ×2
ELECTRODE BLDE 4.0 EZ CLN MEGD (MISCELLANEOUS) ×1 IMPLANT
ELECTRODE REM PT RTRN 9FT ADLT (ELECTROSURGICAL) ×1 IMPLANT
EVACUATOR 3/16  PVC DRAIN (DRAIN) ×1
EVACUATOR 3/16 PVC DRAIN (DRAIN) ×1 IMPLANT
GAUZE SPONGE 4X4 16PLY XRAY LF (GAUZE/BANDAGES/DRESSINGS) IMPLANT
GLOVE BIOGEL M 8.0 STRL (GLOVE) ×2 IMPLANT
GLOVE BIOGEL PI IND STRL 8 (GLOVE) ×3 IMPLANT
GLOVE BIOGEL PI INDICATOR 8 (GLOVE) ×3
GLOVE EXAM NITRILE LRG STRL (GLOVE) IMPLANT
GLOVE EXAM NITRILE MD LF STRL (GLOVE) ×2 IMPLANT
GLOVE EXAM NITRILE XL STR (GLOVE) IMPLANT
GLOVE EXAM NITRILE XS STR PU (GLOVE) IMPLANT
GLOVE INDICATOR 8.5 STRL (GLOVE) ×4 IMPLANT
GOWN BRE IMP SLV AUR LG STRL (GOWN DISPOSABLE) ×2 IMPLANT
GOWN BRE IMP SLV AUR XL STRL (GOWN DISPOSABLE) ×2 IMPLANT
GOWN STRL REIN 2XL LVL4 (GOWN DISPOSABLE) ×4 IMPLANT
KIT BASIN OR (CUSTOM PROCEDURE TRAY) ×2 IMPLANT
KIT ROOM TURNOVER OR (KITS) ×2 IMPLANT
NEEDLE HYPO 18GX1.5 BLUNT FILL (NEEDLE) IMPLANT
NEEDLE HYPO 21X1.5 SAFETY (NEEDLE) ×2 IMPLANT
NEEDLE HYPO 25X1 1.5 SAFETY (NEEDLE) IMPLANT
NEEDLE SPNL 20GX3.5 QUINCKE YW (NEEDLE) IMPLANT
NS IRRIG 1000ML POUR BTL (IV SOLUTION) ×4 IMPLANT
PACK LAMINECTOMY NEURO (CUSTOM PROCEDURE TRAY) ×2 IMPLANT
PAD ARMBOARD 7.5X6 YLW CONV (MISCELLANEOUS) ×6 IMPLANT
PATTIES SURGICAL .5 X1 (DISPOSABLE) ×2 IMPLANT
RUBBERBAND STERILE (MISCELLANEOUS) ×4 IMPLANT
SPONGE GAUZE 4X4 12PLY (GAUZE/BANDAGES/DRESSINGS) ×2 IMPLANT
SPONGE LAP 4X18 X RAY DECT (DISPOSABLE) IMPLANT
SPONGE SURGIFOAM ABS GEL SZ50 (HEMOSTASIS) ×2 IMPLANT
STAPLER SKIN PROX WIDE 3.9 (STAPLE) ×2 IMPLANT
STRIP CLOSURE SKIN 1/2X4 (GAUZE/BANDAGES/DRESSINGS) ×2 IMPLANT
SUT PROLENE 6 0 BV (SUTURE) ×2 IMPLANT
SUT VIC AB 0 CT1 18XCR BRD8 (SUTURE) ×3 IMPLANT
SUT VIC AB 0 CT1 8-18 (SUTURE) ×3
SUT VIC AB 2-0 CP2 18 (SUTURE) ×4 IMPLANT
SUT VIC AB 3-0 SH 8-18 (SUTURE) ×4 IMPLANT
SYR 20CC LL (SYRINGE) ×2 IMPLANT
SYR 20ML ECCENTRIC (SYRINGE) ×2 IMPLANT
SYR 5ML LL (SYRINGE) IMPLANT
TOWEL OR 17X24 6PK STRL BLUE (TOWEL DISPOSABLE) ×2 IMPLANT
TOWEL OR 17X26 10 PK STRL BLUE (TOWEL DISPOSABLE) ×2 IMPLANT
WATER STERILE IRR 1000ML POUR (IV SOLUTION) ×2 IMPLANT

## 2012-03-29 NOTE — Anesthesia Preprocedure Evaluation (Addendum)
Anesthesia Evaluation  Patient identified by MRN, date of birth, ID band Patient awake    Reviewed: Allergy & Precautions, H&P , NPO status , Patient's Chart, lab work & pertinent test results, reviewed documented beta blocker date and time   History of Anesthesia Complications Negative for: history of anesthetic complications  Airway Mallampati: III TM Distance: >3 FB Neck ROM: Full    Dental  (+) Teeth Intact and Dental Advisory Given   Pulmonary neg pulmonary ROS,    Pulmonary exam normal       Cardiovascular hypertension, Pt. on medications     Neuro/Psych negative psych ROS   GI/Hepatic negative GI ROS, Neg liver ROS,   Endo/Other  Morbid obesity  Renal/GU negative Renal ROS     Musculoskeletal   Abdominal   Peds  Hematology   Anesthesia Other Findings   Reproductive/Obstetrics                        Anesthesia Physical Anesthesia Plan  ASA: II  Anesthesia Plan: General   Post-op Pain Management:    Induction: Intravenous  Airway Management Planned: Oral ETT  Additional Equipment:   Intra-op Plan:   Post-operative Plan: Extubation in OR  Informed Consent: I have reviewed the patients History and Physical, chart, labs and discussed the procedure including the risks, benefits and alternatives for the proposed anesthesia with the patient or authorized representative who has indicated his/her understanding and acceptance.   Dental advisory given  Plan Discussed with: CRNA, Anesthesiologist and Surgeon  Anesthesia Plan Comments:         Anesthesia Quick Evaluation

## 2012-03-29 NOTE — Progress Notes (Signed)
03/29/12 0657  OBSTRUCTIVE SLEEP APNEA  Score 4 or greater  Results sent to PCP

## 2012-03-29 NOTE — Transfer of Care (Signed)
Immediate Anesthesia Transfer of Care Note  Patient: Mark Rich  Procedure(s) Performed: Procedure(s) (LRB) with comments: LUMBAR LAMINECTOMY/DECOMPRESSION MICRODISCECTOMY 3 LEVELS (N/A) - bilateral Lumbar three-to five Laminectomy, Lumbar two-three Diskectomy  Patient Location: PACU  Anesthesia Type:General  Level of Consciousness: awake, alert , oriented and patient cooperative  Airway & Oxygen Therapy: Patient Spontanous Breathing and Patient connected to nasal cannula oxygen  Post-op Assessment: Report given to PACU RN, Post -op Vital signs reviewed and stable and Patient moving all extremities  Post vital signs: Reviewed and stable  Complications: No apparent anesthesia complications

## 2012-03-29 NOTE — Progress Notes (Signed)
Dr. Claybon Jabs called to see if patient ready for surgery and to say he doesn't need the cxr.order for  cxr discontinued.
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
## 2012-03-29 NOTE — Anesthesia Postprocedure Evaluation (Signed)
Anesthesia Post Note  Patient: Mark Rich  Procedure(s) Performed: Procedure(s) (LRB): LUMBAR LAMINECTOMY/DECOMPRESSION MICRODISCECTOMY 3 LEVELS (N/A)  Anesthesia type: general  Patient location: PACU  Post pain: Pain level controlled  Post assessment: Patient's Cardiovascular Status Stable  Last Vitals:  Filed Vitals:   03/29/12 1338  BP: 141/89  Pulse: 103  Temp:   Resp: 18    Post vital signs: Reviewed and stable  Level of consciousness: sedated  Complications: No apparent anesthesia complications

## 2012-03-29 NOTE — Anesthesia Procedure Notes (Signed)
Procedure Name: Intubation Date/Time: 03/29/2012 7:56 AM Performed by: Jerilee Hoh Pre-anesthesia Checklist: Patient identified, Emergency Drugs available, Suction available and Patient being monitored Patient Re-evaluated:Patient Re-evaluated prior to inductionOxygen Delivery Method: Circle system utilized Preoxygenation: Pre-oxygenation with 100% oxygen Intubation Type: IV induction Ventilation: Mask ventilation without difficulty and Oral airway inserted - appropriate to patient size Laryngoscope Size: Mac and 4 Grade View: Grade I Tube type: Oral Tube size: 7.5 mm Number of attempts: 1 Airway Equipment and Method: Stylet Placement Confirmation: ETT inserted through vocal cords under direct vision,  positive ETCO2 and breath sounds checked- equal and bilateral Secured at: 23 cm Tube secured with: Tape Dental Injury: Teeth and Oropharynx as per pre-operative assessment

## 2012-03-29 NOTE — Progress Notes (Signed)
Op note [phone]

## 2012-03-30 NOTE — Progress Notes (Signed)
Postop day 1. Patient complains of back pain. Does note some lower trimming a pain but mostly complains of back pain. Denies headache. No other complaints.  He is afebrile. Vitals are stable. Urine output good. Awake and alert. Oriented and appropriate. Motor examination appears grossly intact although effort somewhat poor secondary to pain. No sensory deficits in his right lower extremity. Does note some mild numbness in his left. L4 and L5 dermatomes on the left dressing dry.  Stable postoperatively. Begin efforts at mobilization. Continue PCA

## 2012-03-30 NOTE — Op Note (Signed)
NAMEKAIMANA, LURZ NO.:  [id number]  MEDICAL RECORD NO.:  [id number]  LOCATION:  [id number]                        FACILITY:  [facility]  PHYSICIAN:  Hilda Lias, M.D.   DATE OF BIRTH:  10/20/1970  DATE OF PROCEDURE:  03/29/2012 DATE OF DISCHARGE:                              OPERATIVE REPORT   PREOPERATIVE DIAGNOSES: 1. Lumbar stenosis with neurogenic claudication. 2. Right L2-L3 herniated disk. 3. Obesity.  POSTOPERATIVE DIAGNOSES: 1. Lumbar stenosis with neurogenic claudication. 2. Right L2-L3 herniated disk. 3. Obesity.  PROCEDURE: 1. Bilateral 3, 4, 5 laminectomy, partial. 2. Foraminotomy. 3. Right L2-L3 diskectomy.  SURGEON:  Hilda Lias, MD  ASSISTANT:  Dr. Yetta Barre.  CLINICAL HISTORY:  The gentleman is a 42 year old obese, who had been complaining of back pain worse to both legs associated with walking.  He also described another sharp pain going to the right leg.  By x-ray, he has a severe stenosis from 2-3 down to L5-S1 with a large herniated disk at the level 2-3 to the right.  He has failed with treatment including epidural injection.  Surgery was advised and the risk was fully explained to him and his wife.  DESCRIPTION OF PROCEDURE:  The patient was taken to the OR, and after intubation, he was positioned in a prone manner.  The skin was cleaned with Betadine and DuraPrep.  Drapes were applied.  Because of his size, we were unable to feel any bony structure.  Nevertheless, we did a midline incision through the skin, subcutaneous tissue, adipose tissue straight down to the lumbar spine.  Muscle retracted laterally.  The x- rays showed that the top clip was at the level of spinal process of L2. From then on, using the Leksell, we removed the spinous process of L2, L3, and L4.  With the drill as well as the Leksell, we did laminectomy to decompress the thecal sac.  Partial laminotomy of L2 was done.  The patient had quite a bit of thick  yellow ligament and this was removed. There was a partial opening in the midline.  Because the thecal complaints were quite tense, I was able to put a hook inside the canal intradurally to see if there was any abnormality.  None was found and the opening of the dura was closed with 2 single stitches of 6-0 Prolene.  Having a good decompression of the thecal sac from the lower part of L2-L5-S1, we did foraminotomy to decompress the nerve bilaterally.  Then, we repeated the x-ray and we localized 2-3.  The thecal sac was retracted.  We found that the patient has quite a bit of calcified disk centrally and to the right. Incision was made in the disk, and soft tissue as well as calcified and degenerative disk was removed.  At the end, we had a good decompression at that area.  From then on, the area was irrigated.  Valsalva up to 40 was negative.  The wound was closed with Vicryl and staples.          ______________________________ Hilda Lias, M.D.     EB/MEDQ  D:  03/29/2012  T:  03/30/2012  Job:  [id number]

## 2012-03-30 NOTE — Evaluation (Signed)
Occupational Therapy Evaluation Patient Details Name: Mark Rich MRN: [id number] DOB: 03/27/1970 Today's Date: 03/30/2012 Time: 5621-3086 (first attempt 5784-6962) OT Time Calculation (min): 26 min  OT Assessment / Plan / Recommendation Clinical Impression  42 yo male s/p L3-5 laminectomy and L2-3 microdiscectomy. Ot to follow acutely. Pt is not ready for d/c. Recommend HHOT for d/c planning pending progress.    OT Assessment  Patient needs continued OT Services    Follow Up Recommendations  Home health OT    Barriers to Discharge      Equipment Recommendations  3 in 1 bedside comode (RW)    Recommendations for Other Services    Frequency  Min 2X/week    Precautions / Restrictions Precautions Precautions: Back Precaution Comments: hand out provided and reviewed Required Braces or Orthoses: Spinal Brace Spinal Brace: Lumbar corset;Applied in sitting position Restrictions Weight Bearing Restrictions: No   Pertinent Vitals/Pain Pain in Rt buttocks Incontinence of bladder    ADL  Eating/Feeding: Set up Where Assessed - Eating/Feeding: Bed level Upper Body Dressing: Moderate assistance Where Assessed - Upper Body Dressing: Unsupported sitting Lower Body Dressing: +1 Total assistance Where Assessed - Lower Body Dressing: Supine, head of bed up Toilet Transfer: +2 Total assistance Toilet Transfer: Patient Percentage: 50% Toilet Transfer Method: Sit to stand Toilet Transfer Equipment: Raised toilet seat with arms (or 3-in-1 over toilet) Equipment Used: Gait belt;Back brace;Rolling walker Transfers/Ambulation Related to ADLs: Pt only able to complete sit<>stand total +2 (A) with bladder incontinence immediately with standing ADL Comments: pt with incontinence and lack of awareness. pt with pain in rt buttock. Pt unable to tolerate standing > 45 minutes. Pt with gown changed during session. pt required mod v/c adn mod (A) to maintain back precautions with bed mobility. Pt  will need to increase independence to d/c home. Pt currently not safe to d/c home at this time    OT Diagnosis: Generalized weakness;Acute pain  OT Problem List: Decreased strength;Decreased activity tolerance;Impaired balance (sitting and/or standing);Decreased safety awareness;Decreased knowledge of use of DME or AE;Decreased knowledge of precautions;Pain OT Treatment Interventions: Self-care/ADL training;DME and/or AE instruction;Therapeutic activities;Patient/family education;Balance training   OT Goals Acute Rehab OT Goals OT Goal Formulation: With patient/family Time For Goal Achievement: 04/13/12 Potential to Achieve Goals: Good ADL Goals Pt Will Perform Upper Body Bathing: with modified independence;Sit to stand from chair;Unsupported;with adaptive equipment ADL Goal: Upper Body Bathing - Progress: Goal set today Pt Will Perform Lower Body Bathing: with modified independence;Sit to stand from chair;Unsupported;with adaptive equipment ADL Goal: Lower Body Bathing - Progress: Goal set today Pt Will Perform Upper Body Dressing: with modified independence;Sit to stand from chair ADL Goal: Upper Body Dressing - Progress: Goal set today Pt Will Perform Lower Body Dressing: with modified independence;Sit to stand from chair;with adaptive equipment;Unsupported ADL Goal: Lower Body Dressing - Progress: Goal set today Pt Will Transfer to Toilet: with modified independence;with DME;3-in-1 ADL Goal: Toilet Transfer - Progress: Goal set today Miscellaneous OT Goals Miscellaneous OT Goal #1: Pt will complete bed mobilty Mod i with back precautions as precursor to adls OT Goal: Miscellaneous Goal #1 - Progress: Goal set today  Visit Information  Last OT Received On: 03/30/12 Assistance Needed: +2 PT/OT Co-Evaluation/Treatment: Yes    Subjective Data  Subjective: "my butt is on fire, i need to lay back down"- pt reporting Rt buttock pain and bladder incontinence offered with pt  unaware Patient Stated Goal: to return to work   Prior Functioning     Home Living  Lives With: Alone Available Help at Discharge: Friend(s);Available 24 hours/day Type of Home: House Home Access: Level entry Home Layout: Two level;Bed/bath upstairs Alternate Level Stairs-Number of Steps: 12 Alternate Level Stairs-Rails: Right;Left;Can reach both Bathroom Shower/Tub: Tub/shower unit;Curtain Firefighter: Standard Bathroom Accessibility: Yes How Accessible: Accessible via walker Home Adaptive Equipment: None Prior Function Level of Independence: Independent Able to Take Stairs?: Yes Driving: Yes 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  Communication Communication: No difficulties Dominant Hand: Right         Vision/Perception     Cognition  Overall Cognitive Status: Appears within functional limits for tasks assessed/performed Arousal/Alertness: Awake/alert Orientation Level: Appears intact for tasks assessed Behavior During Session: [facility] for tasks performed    Extremity/Trunk Assessment Right Upper Extremity Assessment RUE ROM/Strength/Tone: [facility] for tasks assessed;Unable to fully assess;Due to precautions RUE Sensation: Deficits RUE Sensation Deficits: palm of hand feels numb RUE Coordination: WFL - gross motor Left Upper Extremity Assessment LUE ROM/Strength/Tone: Unable to fully assess;Due to precautions;WFL for tasks assessed LUE Coordination: WFL - gross motor Right Lower Extremity Assessment RLE ROM/Strength/Tone: Unable to fully assess;Due to pain Left Lower Extremity Assessment LLE ROM/Strength/Tone: Unable to fully assess;Due to pain     Mobility Bed Mobility Bed Mobility: Rolling Right;Right Sidelying to Sit;Sitting - Scoot to [place] of Bed;Sit to Sidelying Right Rolling Right: 3: Mod assist Right Sidelying to Sit: 2: Max assist Sitting - Scoot to [place] of Bed: 2: Max assist Sit to Sidelying  Right: 1: +2 Total assist Sit to Sidelying Right: Patient Percentage: 40% Details for Bed Mobility Assistance: VC for safe technique to maintain back precautions during transfer. Significant assistance through the trunk as well as LEs in/out of bed. Pt slow movement complaining of burning pain in butt upon sitting.  Transfers Sit to Stand: 1: +2 Total assist;With upper extremity assist;From bed Sit to Stand: Patient Percentage: 50% Stand to Sit: 1: +2 Total assist;With upper extremity assist;To bed Stand to Sit: Patient Percentage: 50% Details for Transfer Assistance: VC for safe hand placement and technique. Assist x 2 for support into standing. Pt only able to maintain standing around ~45 seconds before complaining of burning pain in butt and genitals and needed to lay back down     Shoulder Instructions     Exercise     Balance     End of Session OT - End of Session Activity Tolerance: Patient limited by pain Patient left: in bed;with call bell/phone within reach;with family/visitor present Nurse Communication: Mobility status;Precautions  GO     Lucile Shutters 03/30/2012, 4:16 PM Pager: [phone]

## 2012-03-30 NOTE — Evaluation (Signed)
Physical Therapy Evaluation Patient Details Name: Mark Rich MRN: [id number] DOB: 1971/03/06 Today's Date: 03/30/2012 Time: 1478-2956 PT Time Calculation (min): 20 min  PT Assessment / Plan / Recommendation Clinical Impression  Pt s/p L3-5 laminectomy and L2-3 microdiscectomy. Pt limited this session secondary to complaints of pain in both butt and genitals during transfers as well as incontinence, RN aware. Pt will benefit from skilled PT in the acute care setting in order to maximize functional mobility, strength and safety prior to d/c home    PT Assessment  Patient needs continued PT services    Follow Up Recommendations  Home health PT;Supervision/Assistance - 24 hour    Does the patient have the potential to tolerate intense rehabilitation      Barriers to Discharge        Equipment Recommendations  Rolling walker with 5" wheels    Recommendations for Other Services     Frequency Min 5X/week    Precautions / Restrictions Precautions Precautions: Back Restrictions Weight Bearing Restrictions: No   Pertinent Vitals/Pain Pain 10/10 with burning sensation in buttocks and genitals, RN aware and notifying MD      Mobility  Bed Mobility Bed Mobility: Rolling Right;Right Sidelying to Sit;Sitting - Scoot to [place] of Bed;Sit to Sidelying Right Rolling Right: 3: Mod assist Right Sidelying to Sit: 2: Max assist Sitting - Scoot to [place] of Bed: 2: Max assist Sit to Sidelying Right: 1: +2 Total assist Sit to Sidelying Right: Patient Percentage: 40% Details for Bed Mobility Assistance: VC for safe technique to maintain back precautions during transfer. Significant assistance through the trunk as well as LEs in/out of bed. Pt slow movement complaining of burning pain in butt upon sitting.  Transfers Transfers: Sit to Stand;Stand to Sit Sit to Stand: 1: +2 Total assist Sit to Stand: Patient Percentage: 50% Stand to Sit: 1: +2 Total assist Stand to Sit: Patient Percentage:  50% Details for Transfer Assistance: VC for safe hand placement and technique. Assist x 2 for support into standing. Pt only able to maintain standing around ~45 seconds before complaining of burning pain in butt and genitals and needed to lay back down Ambulation/Gait Ambulation/Gait Assistance: Not tested (comment)    Shoulder Instructions     Exercises     PT Diagnosis: Difficulty walking;Acute pain  PT Problem List: Decreased activity tolerance;Decreased mobility;Decreased knowledge of use of DME;Decreased safety awareness;Decreased knowledge of precautions;Pain PT Treatment Interventions: Gait training;DME instruction;Stair training;Functional mobility training;Therapeutic activities;Patient/family education   PT Goals Acute Rehab PT Goals PT Goal Formulation: With patient/family Time For Goal Achievement: 04/06/12 Potential to Achieve Goals: Fair Pt will go Supine/Side to Sit: with modified independence PT Goal: Supine/Side to Sit - Progress: Goal set today Pt will go Sit to Supine/Side: with modified independence PT Goal: Sit to Supine/Side - Progress: Goal set today Pt will go Sit to Stand: with modified independence PT Goal: Sit to Stand - Progress: Goal set today Pt will go Stand to Sit: with modified independence PT Goal: Stand to Sit - Progress: Goal set today Pt will Transfer Bed to Chair/Chair to Bed: with supervision PT Transfer Goal: Bed to Chair/Chair to Bed - Progress: Goal set today Pt will Ambulate: >150 feet;with supervision;with least restrictive assistive device PT Goal: Ambulate - Progress: Goal set today Pt will Go Up / Down Stairs: Flight;with supervision;with rail(s) PT Goal: Up/Down Stairs - Progress: Goal set today  Visit Information  Last PT Received On: 03/30/12 Assistance Needed: +1 PT/OT Co-Evaluation/Treatment: Yes  Subjective Data      Prior Functioning  Home Living Lives With: Alone Available Help at Discharge: Friend(s);Available 24  hours/day Type of Home: House Home Access: Level entry Home Layout: Two level;Bed/bath upstairs Alternate Level Stairs-Number of Steps: 12 Alternate Level Stairs-Rails: Right;Left;Can reach both Bathroom Shower/Tub: Tub/shower unit;Curtain Firefighter: Standard Bathroom Accessibility: Yes How Accessible: Accessible via walker Home Adaptive Equipment: None Prior Function Level of Independence: Independent Able to Take Stairs?: Yes Driving: Yes Vocation: Full time employment 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 Communication Communication: No difficulties Dominant Hand: Right    Cognition  Overall Cognitive Status: Appears within functional limits for tasks assessed/performed Arousal/Alertness: Awake/alert Orientation Level: Appears intact for tasks assessed Behavior During Session: [facility] for tasks performed    Extremity/Trunk Assessment Right Lower Extremity Assessment RLE ROM/Strength/Tone: Unable to fully assess;Due to pain Left Lower Extremity Assessment LLE ROM/Strength/Tone: Unable to fully assess;Due to pain   Balance    End of Session PT - End of Session Equipment Utilized During Treatment: Gait belt;Back brace Activity Tolerance: Patient limited by pain Patient left: in bed;with call bell/phone within reach;with family/visitor present Nurse Communication: Mobility status;Other (comment) (pts pain)  GP     Milana Kidney 03/30/2012, 3:25 PM  03/30/2012 Milana Kidney DPT PAGER: [phone] OFFICE: [phone]

## 2012-03-30 NOTE — Progress Notes (Signed)
When pt stood for pt, was incontinent of urine and seemed totally unware fact. Complaining pain in rt hip radiating down rt leg unable to stand

## 2012-03-31 MED ORDER — NIMODIPINE 30 MG PO CAPS
60.0000 mg | ORAL_CAPSULE | ORAL | Status: DC
  Filled 2012-03-31 (×3): qty 2

## 2012-03-31 MED ORDER — DEXAMETHASONE SODIUM PHOSPHATE 4 MG/ML IJ SOLN
4.0000 mg | Freq: Four times a day (QID) | INTRAMUSCULAR | Status: AC
  Administered 2012-03-31 – 2012-04-01 (×4): 4 mg via INTRAVENOUS
  Filled 2012-03-31 (×5): qty 1

## 2012-03-31 MED ORDER — CELECOXIB 200 MG PO CAPS
200.0000 mg | ORAL_CAPSULE | Freq: Two times a day (BID) | ORAL | Status: DC
  Administered 2012-03-31 – 2012-04-04 (×9): 200 mg via ORAL
  Filled 2012-03-31 (×10): qty 1

## 2012-03-31 MED ORDER — GABAPENTIN 300 MG PO CAPS
300.0000 mg | ORAL_CAPSULE | Freq: Three times a day (TID) | ORAL | Status: DC
  Administered 2012-03-31 – 2012-04-04 (×13): 300 mg via ORAL
  Filled 2012-03-31 (×15): qty 1

## 2012-03-31 NOTE — Progress Notes (Signed)
Patient ID: Mark Rich, male   DOB: February 04, 1971, 42 y.o.   MRN: [id number] Patient reports burning pain in his legs especially with ambulation. Feels his right leg is numb and somewhat weak. He did ambulate with his walker and physical therapy. Dressing is dry. No headache. Back is appropriately sore. Some question about an incontinence episode yesterday when he stood with the physical therapists, but he assures me that he has been able to control his bladder since that time and he denies numbness in his perineum. Individual muscle strength in lower extremities is about 4-4+ out of 5. He has not had any flatus today and I worry that he has the start of an ileus, he did have some vomiting yesterday but not today, but he has not eaten. Will start him on Neurontin, Decadron, and Celebrex in hopes of controlling his symptoms. Clear liquid diet until he has flatus.

## 2012-03-31 NOTE — Progress Notes (Signed)
Physical Therapy Treatment Patient Details Name: Mark Rich MRN: [id number] DOB: 07/06/1970 Today's Date: 03/31/2012 Time: 9528-4132 PT Time Calculation (min): 31 min  PT Assessment / Plan / Recommendation Comments on Treatment Session  Pt progressing this session with ambulation, although still with burning sensation throughout butt and bilateral legs. R leg with good strength initially but with increased walking and upright posture, right leg increased numbness. Continue per plan    Follow Up Recommendations  Home health PT;Supervision/Assistance - 24 hour     Does the patient have the potential to tolerate intense rehabilitation     Barriers to Discharge        Equipment Recommendations  Rolling walker with 5" wheels    Recommendations for Other Services    Frequency Min 5X/week   Plan Discharge plan remains appropriate;Frequency remains appropriate    Precautions / Restrictions Precautions Precautions: Back Precaution Booklet Issued: Yes (comment) Precaution Comments: pt able to verbalize 2/3 back precautions; pt reeducated Required Braces or Orthoses: Spinal Brace Spinal Brace: Lumbar corset;Applied in sitting position Restrictions Weight Bearing Restrictions: No   Pertinent Vitals/Pain Pain 10/10 in bilateral buttocks. RN and MD aware, PCA encouraged.     Mobility  Bed Mobility Bed Mobility: Rolling Right;Right Sidelying to Sit;Sitting - Scoot to [place] of Bed;Sit to Sidelying Right Rolling Right: 4: Min assist Right Sidelying to Sit: 2: Max assist Sitting - Scoot to [place] of Bed: 4: Min assist Details for Bed Mobility Assistance: Assist through trunk and pelvis into sitting, slow movement into sitting secondary to pain. Cues for safe technique while maintianing back precuations.  Transfers Transfers: Sit to Stand;Stand to Sit Sit to Stand: 1: +2 Total assist;With upper extremity assist;From bed Sit to Stand: Patient Percentage: 60% Stand to Sit: 1: +2 Total  assist;With upper extremity assist;To bed Stand to Sit: Patient Percentage: 60% Details for Transfer Assistance: Vc for safe hand placement although pt still reaching up for RW for support into standing.  Ambulation/Gait Ambulation/Gait Assistance: 4: Min assist Ambulation Distance (Feet): 60 Feet Assistive device: Rolling walker Ambulation/Gait Assistance Details: VC for close distance to RW. Pt moving quickly but with increased fatigue, right leg started getting numb and pt was unable to maintain, needed to sit. Assist to maintain R knee from buckling as well as support. MD aware. Increased UE use throughout gait Gait Pattern: Step-to pattern;Decreased hip/knee flexion - right;Decreased stride length;Right flexed knee in stance;Wide base of support Gait velocity: slow Stairs: No    Exercises     PT Diagnosis:    PT Problem List:   PT Treatment Interventions:     PT Goals Acute Rehab PT Goals PT Goal: Supine/Side to Sit - Progress: Progressing toward goal PT Goal: Sit to Stand - Progress: Progressing toward goal PT Goal: Stand to Sit - Progress: Progressing toward goal PT Transfer Goal: Bed to Chair/Chair to Bed - Progress: Progressing toward goal PT Goal: Ambulate - Progress: Progressing toward goal  Visit Information  Last PT Received On: 03/31/12 Assistance Needed: +2    Subjective Data      Cognition  Overall Cognitive Status: Appears within functional limits for tasks assessed/performed Arousal/Alertness: Awake/alert Orientation Level: Appears intact for tasks assessed Behavior During Session: [facility] for tasks performed    Balance     End of Session PT - End of Session Equipment Utilized During Treatment: Gait belt;Back brace Activity Tolerance: Patient limited by pain Patient left: in chair;with call bell/phone within reach;with family/visitor present;with nursing in room Nurse Communication: Mobility status  GP     Milana Kidney 03/31/2012, 9:43 AM

## 2012-03-31 NOTE — Clinical Social Work Note (Signed)
CSW consult for SNF. PT/OT recommendation for [facility] with 24 hour supervision noted. Per chart review, pt has 24 hour supervision in place at d/c. CSW signing off as no other CSW needs identified at this time. Please re-consult if SNF needed.  Dellie Burns, MSW, LCSWA [phone] (Weekends 8:00am-4:30pm)

## 2012-04-01 ENCOUNTER — Encounter (HOSPITAL_COMMUNITY): Payer: Self-pay | Admitting: Neurosurgery

## 2012-04-01 MED ORDER — OXYCODONE HCL 5 MG PO TABS
15.0000 mg | ORAL_TABLET | ORAL | Status: DC | PRN
  Administered 2012-04-01 – 2012-04-04 (×11): 15 mg via ORAL
  Filled 2012-04-01 (×12): qty 3

## 2012-04-01 MED ORDER — FLEET ENEMA 7-19 GM/118ML RE ENEM: 1.0000 | ENEMA | Freq: Every day | RECTAL | Status: DC | PRN

## 2012-04-01 NOTE — Progress Notes (Signed)
Constipated, urinating well. Some numbness at l45. No weakness. A lot of side effects with morphine. Pt to see

## 2012-04-01 NOTE — Progress Notes (Signed)
Physical Therapy Treatment Patient Details Name: Mark Rich MRN: [id number] DOB: [birth year] Today's Date: 04/01/2012 Time: 8469-6295 PT Time Calculation (min): 23 min  PT Assessment / Plan / Recommendation Comments on Treatment Session  Pt cont's to c/o R LE pain & numbness.  Pt able to increase ambulation distance but appears to be very painful as evident in pt becoming tearful.      Follow Up Recommendations  Home health PT;Supervision/Assistance - 24 hour     Does the patient have the potential to tolerate intense rehabilitation     Barriers to Discharge        Equipment Recommendations  Rolling walker with 5" wheels    Recommendations for Other Services    Frequency Min 5X/week   Plan Discharge plan remains appropriate;Frequency remains appropriate    Precautions / Restrictions Precautions Precautions: Back Precaution Comments: pt able to verbalize 2/3 back precautions; pt reeducated Required Braces or Orthoses: Spinal Brace Spinal Brace: Lumbar corset;Applied in sitting position Restrictions Weight Bearing Restrictions: No   Pertinent Vitals/Pain C/o Rt LE pain & incisional "soreness".  Did not rate however, pt received pain medication ~45 mins prior to PT session per RN.    Mobility  Bed Mobility Bed Mobility: Rolling Left;Left Sidelying to Sit;Sitting - Scoot to Edge of Bed Rolling Left: 4: Min guard Left Sidelying to Sit: 3: Mod assist;HOB flat Sitting - Scoot to Edge of Bed: 4: Min guard Details for Bed Mobility Assistance: Cues for sequencing & technique.  (A) to lift shoulders/trunk to sitting upright with facilitation at Rt rib cage + Lt hip.   Transfers Transfers: Sit to Stand;Stand to Sit Sit to Stand: 4: Min assist;From bed;From elevated surface;With upper extremity assist Stand to Sit: 4: Min assist;With upper extremity assist;With armrests;To chair/3-in-1 Details for Transfer Assistance: Height of bed elevated.  Cues for hand placement & technique.   (A) to achieve standing, balance, & controlled descent.  Pt slow to achieve full erect posture.   Ambulation/Gait Ambulation/Gait Assistance: 4: Min guard Ambulation Distance (Feet): 80 Feet Assistive device: Rolling walker Ambulation/Gait Assistance Details: Cues for tall posture, safe RW advancement & body positioning inside RW, use of UE's due to Rt LE pain Gait Pattern: Step-to pattern;Antalgic;Decreased stance time - right;Decreased step length - left;Wide base of support Gait velocity: slow Stairs: No       PT Goals Acute Rehab PT Goals Time For Goal Achievement: 04/06/12 Potential to Achieve Goals: Fair Pt will go Supine/Side to Sit: with modified independence PT Goal: Supine/Side to Sit - Progress: Progressing toward goal Pt will go Sit to Supine/Side: with modified independence Pt will go Sit to Stand: with modified independence PT Goal: Sit to Stand - Progress: Progressing toward goal Pt will go Stand to Sit: with modified independence PT Goal: Stand to Sit - Progress: Progressing toward goal Pt will Transfer Bed to Chair/Chair to Bed: with supervision Pt will Ambulate: >150 feet;with supervision;with least restrictive assistive device PT Goal: Ambulate - Progress: Progressing toward goal Pt will Go Up / Down Stairs: Flight;with supervision;with rail(s)  Visit Information  Last PT Received On: 04/01/12 Assistance Needed: +1    Subjective Data      Cognition  Overall Cognitive Status: Appears within functional limits for tasks assessed/performed Arousal/Alertness: Awake/alert Orientation Level: Appears intact for tasks assessed Behavior During Session: [facility] for tasks performed    Balance     End of Session PT - End of Session Equipment Utilized During Treatment: Gait belt;Back brace Activity Tolerance: Patient tolerated  treatment well;Patient limited by pain Patient left: in chair;with call bell/phone within reach Nurse Communication: Mobility status     Verdell Face, [place] [phone] 04/01/2012

## 2012-04-02 MED ORDER — FLEET ENEMA 7-19 GM/118ML RE ENEM
1.0000 | ENEMA | Freq: Once | RECTAL | Status: AC
  Administered 2012-04-03: 1 via RECTAL
  Filled 2012-04-02: qty 1

## 2012-04-02 NOTE — Progress Notes (Signed)
Occupational Therapy Treatment Patient Details Name: Mark Rich MRN: [id number] DOB: 06-Apr-1970 Today's Date: 04/02/2012 Time: 4098-1191 OT Time Calculation (min): 26 min  OT Assessment / Plan / Recommendation Comments on Treatment Session Pt appears to be mobilizing much better. Pt no longer incontinent. Pt still reporting significant pain in his left lower back and hip. Will continue to follow.    Follow Up Recommendations  Home health OT    Barriers to Discharge       Equipment Recommendations  3 in 1 bedside comode    Recommendations for Other Services    Frequency Min 2X/week   Plan Discharge plan remains appropriate    Precautions / Restrictions Precautions Precautions: Back Precaution Comments: pt able to verbalize 2/3 back precautions; pt reeducated Required Braces or Orthoses: Spinal Brace Spinal Brace: Lumbar corset;Applied in sitting position   Pertinent Vitals/Pain Pt reported 7/10 low back pain and L hip pain. Repositioned for comfort.    ADL  Grooming: Teeth care;Supervision/safety Where Assessed - Grooming: Supported standing Toilet Transfer: Radiographer, therapeutic Method: Sit to Barista: Comfort height toilet;Grab bars Toileting - Architect and Hygiene: Supervision/safety Where Assessed - Engineer, mining and Hygiene: Sit to stand from 3-in-1 or toilet Transfers/Ambulation Related to ADLs: Re-educated pt in all back precautions. Pt was only able to recall 2/3. Also discussed proper body mechanics during ADL activity. ADL Comments: Pt's incontinence appears to be under control. Pt ambulated to the bathroom and urinated into toilet standing up. Min VCs for safe manipulation of RW around the bathroom. Pt declined education on AE stating someone would be helping him at home.    OT Diagnosis:    OT Problem List:   OT Treatment Interventions:     OT Goals ADL Goals ADL Goal: Toilet Transfer -  Progress: Progressing toward goals Miscellaneous OT Goals OT Goal: Miscellaneous Goal #1 - Progress: Progressing toward goals  Visit Information  Last OT Received On: 04/02/12 Assistance Needed: +1    Subjective Data  Subjective: I'll have someone at home to help me with my clothes and all that.   Prior Functioning       Cognition  Overall Cognitive Status: Appears within functional limits for tasks assessed/performed Arousal/Alertness: Awake/alert Orientation Level: Appears intact for tasks assessed Behavior During Session: [facility] for tasks performed    Mobility  Shoulder Instructions Bed Mobility Rolling Right: 5: Supervision Right Sidelying to Sit: 5: Supervision;HOB flat Details for Bed Mobility Assistance: min cues for sequencing and technique of performing log roll. Transfers Sit to Stand: 5: Supervision;From bed;With upper extremity assist Stand to Sit: 5: Supervision;To bed;With upper extremity assist;To toilet Details for Transfer Assistance: Height of bed elevated. Cues for hand placement & technique. Pt moves very slowly.       Exercises      Balance Static Standing Balance Static Standing - Balance Support: No upper extremity supported;During functional activity Static Standing - Level of Assistance: 5: Stand by assistance   End of Session OT - End of Session Activity Tolerance: Patient limited by pain Patient left: in bed;with call bell/phone within reach  GO     Rad Gramling A OTR/L [phone] 04/02/2012, 11:51 AM

## 2012-04-02 NOTE — Progress Notes (Signed)
NCM spoke to pt and offered choice. Pt agreeable to [facility] for HH. States he has RW at home. Lives at home with wife, Leta Jungling. Notified [facility] of orders for [facility]. [facility] contact info added to dc instructions. Isidoro Donning RN CCM Case Mgmt phone [phone]

## 2012-04-02 NOTE — Progress Notes (Signed)
Patient ID: Mark Rich, male   DOB: [birth year], 42 y.o.   MRN: [id number] Poor activity, did walk some last night. No weakness. Constipated. No enema given

## 2012-04-02 NOTE — Progress Notes (Signed)
Physical Therapy Treatment Patient Details Name: Mark Rich MRN: [id number] DOB: 11-01-1970 Today's Date: 04/02/2012 Time: 4098-1191 PT Time Calculation (min): 23 min  PT Assessment / Plan / Recommendation Comments on Treatment Session  Pt making great improvements with mobility at this date.   Increased ambulation distance & negotiated stairs this session.  Pt reports numbness & burning are no longer present but still having pain in Rt hip (6/10).      Follow Up Recommendations  Home health PT;Supervision/Assistance - 24 hour     Does the patient have the potential to tolerate intense rehabilitation     Barriers to Discharge        Equipment Recommendations  Rolling walker with 5" wheels    Recommendations for Other Services    Frequency Min 5X/week   Plan Discharge plan remains appropriate;Frequency remains appropriate    Precautions / Restrictions Precautions Precautions: Back Precaution Comments: pt able to verbalize 2/3 back precautions; pt reeducated Required Braces or Orthoses: Spinal Brace Spinal Brace: Lumbar corset;Applied in sitting position   Pertinent Vitals/Pain 6/10 Rt hip.     Mobility  Bed Mobility Bed Mobility: Rolling Right;Right Sidelying to Sit;Sitting - Scoot to Edge of Bed Rolling Right: 6: Modified independent (Device/Increase time) Right Sidelying to Sit: 6: Modified independent (Device/Increase time);HOB flat Sitting - Scoot to Edge of Bed: 6: Modified independent (Device/Increase time) Transfers Transfers: Sit to Stand;Stand to Sit Sit to Stand: 6: Modified independent (Device/Increase time);With upper extremity assist;From bed Stand to Sit: 6: Modified independent (Device/Increase time);With upper extremity assist;With armrests;To chair/3-in-1 Ambulation/Gait Ambulation/Gait Assistance: 5: Supervision Ambulation Distance (Feet): 300 Feet Assistive device: Rolling walker Ambulation/Gait Assistance Details: Pt progressing from step-to to  step-through gait pattern.  Cues to relax UE's & to not step too close to front of RW.   Pt denies burning nor numbness in LE's.   Gait Pattern: Step-through pattern;Wide base of support Stairs: Yes Stairs Assistance: 4: Min guard Stair Management Technique: Two rails;Alternating pattern;Forwards Number of Stairs: 10  Wheelchair Mobility Wheelchair Mobility: No      PT Goals Acute Rehab PT Goals Time For Goal Achievement: 04/06/12 Potential to Achieve Goals: Fair Pt will go Supine/Side to Sit: with modified independence PT Goal: Supine/Side to Sit - Progress: Met Pt will go Sit to Supine/Side: with modified independence Pt will go Sit to Stand: with modified independence PT Goal: Sit to Stand - Progress: Met Pt will go Stand to Sit: with modified independence PT Goal: Stand to Sit - Progress: Met Pt will Transfer Bed to Chair/Chair to Bed: with supervision Pt will Ambulate: >150 feet;with supervision;with least restrictive assistive device PT Goal: Ambulate - Progress: Met Pt will Go Up / Down Stairs: Flight;with supervision;with rail(s) PT Goal: Up/Down Stairs - Progress: Progressing toward goal  Visit Information  Last PT Received On: 04/02/12 Assistance Needed: +1    Subjective Data      Cognition  Overall Cognitive Status: Appears within functional limits for tasks assessed/performed Arousal/Alertness: Awake/alert Orientation Level: Appears intact for tasks assessed Behavior During Session: [facility] for tasks performed    Balance  Static Standing Balance Static Standing - Balance Support: No upper extremity supported;During functional activity Static Standing - Level of Assistance: 5: Stand by assistance  End of Session PT - End of Session Equipment Utilized During Treatment: Gait belt;Back brace Activity Tolerance: Patient tolerated treatment well Patient left: in chair;with call bell/phone within reach Nurse Communication: Mobility status     Verdell Face,  [place] [phone] 04/02/2012

## 2012-04-02 NOTE — Progress Notes (Signed)
[facility]  Patient Status: New  [facility] is providing the following services: PT and OT  If patient discharges after hours, please call [phone].   Wynelle Bourgeois 04/02/2012, 10:25 AM

## 2012-04-02 NOTE — Progress Notes (Signed)
Patient states that the numbness and pain on his right side is improving. Patient ambulated early in the shift with stand by assist. Tolerated well. Refused to ambulate this am because "physical therapy will be coming soon". Patient slept well. Will continue to monitor per shift.

## 2012-04-03 NOTE — Progress Notes (Signed)
Physical Therapy Treatment Patient Details Name: Mark Rich MRN: [id number] DOB: 1970-05-26 Today's Date: 04/03/2012 Time: 2440-1027 PT Time Calculation (min): 18 min  PT Assessment / Plan / Recommendation Comments on Treatment Session  Cont's to move well.  Still reports burning & numbness no longer present but has appropriate soreness at incisional site.      Follow Up Recommendations  Home health PT;Supervision/Assistance - 24 hour     Does the patient have the potential to tolerate intense rehabilitation     Barriers to Discharge        Equipment Recommendations  Rolling walker with 5" wheels    Recommendations for Other Services    Frequency Min 5X/week   Plan Discharge plan remains appropriate;Frequency remains appropriate    Precautions / Restrictions Precautions Precautions: Back Precaution Comments: Pt verbalized 3/3 back precautions & does well adhering to all 3.   Required Braces or Orthoses: Spinal Brace Spinal Brace: Lumbar corset;Applied in sitting position       Mobility  Bed Mobility Bed Mobility: Rolling Right;Right Sidelying to Sit;Sitting - Scoot to Edge of Bed Rolling Right: 6: Modified independent (Device/Increase time) Right Sidelying to Sit: 6: Modified independent (Device/Increase time) Sitting - Scoot to Edge of Bed: 6: Modified independent (Device/Increase time) Transfers Transfers: Sit to Stand;Stand to Sit Sit to Stand: 6: Modified independent (Device/Increase time);With upper extremity assist;From bed Stand to Sit: 6: Modified independent (Device/Increase time);With upper extremity assist;With armrests;To chair/3-in-1 Ambulation/Gait Ambulation/Gait Assistance: 5: Supervision Ambulation Distance (Feet): 500 Feet Assistive device: Rolling walker Ambulation/Gait Assistance Details: Encouragement for decreased use of UE's on RW.  Pt with stiff posture.   Gait Pattern: Step-through pattern;Wide base of support Stairs: Yes Stairs  Assistance: 5: Supervision Stairs Assistance Details (indicate cue type and reason): Supervision for safety.   Stair Management Technique: Two rails;Alternating pattern;Forwards Number of Stairs: 10  Wheelchair Mobility Wheelchair Mobility: No      PT Goals Acute Rehab PT Goals Time For Goal Achievement: 04/06/12 Potential to Achieve Goals: Fair Pt will go Supine/Side to Sit: with modified independence PT Goal: Supine/Side to Sit - Progress: Met Pt will go Sit to Supine/Side: with modified independence Pt will go Sit to Stand: with modified independence PT Goal: Sit to Stand - Progress: Met Pt will go Stand to Sit: with modified independence PT Goal: Stand to Sit - Progress: Met Pt will Transfer Bed to Chair/Chair to Bed: with supervision Pt will Ambulate: >150 feet;with modified independence;with least restrictive assistive device PT Goal: Ambulate - Progress: Goal set today Pt will Go Up / Down Stairs: Flight;Independently PT Goal: Up/Down Stairs - Progress: Goal set today  Visit Information  Last PT Received On: 04/03/12 Assistance Needed: +1    Subjective Data      Cognition  Overall Cognitive Status: Appears within functional limits for tasks assessed/performed Arousal/Alertness: Awake/alert Orientation Level: Appears intact for tasks assessed Behavior During Session: [facility] for tasks performed    Balance     End of Session PT - End of Session Equipment Utilized During Treatment: Gait belt;Back brace Activity Tolerance: Patient tolerated treatment well Patient left: in chair;with call bell/phone within reach Nurse Communication: Mobility status     Verdell Face, [place] [phone] 04/03/2012

## 2012-04-03 NOTE — Progress Notes (Signed)
Patient ID: Mark Rich, male   DOB: Dec 29, 1970, 42 y.o.   MRN: [id number] Ambulating. Wound dry. Wants to go home in am

## 2012-04-04 NOTE — Discharge Summary (Signed)
Physician Discharge Summary  Patient ID: Mark Rich MRN: [id number] DOB/AGE: 10-18-1970 42 y.o.  Admit date: 03/29/2012 Discharge date: 04/04/2012  Admission Diagnoses:lumbar stenosis. hnp  Discharge Diagnoses: same   Discharged Condition: no weakness  Hospital Course: surgery  Consults: none  Significant Diagnostic Studies: myelogram  Treatments: lumbar decompression  Discharge Exam: Blood pressure 125/106, pulse 96, temperature 97.8 F (36.6 C), temperature source Oral, resp. rate 18, height 6\' 3"  (1.905 m), weight 140.6 kg (309 lb 15.5 oz), SpO2 97.00%. ambulating Disposition: home     Medication List     As of 04/04/2012  9:47 AM    ASK your doctor about these medications         diazepam 10 MG tablet   Commonly known as: VALIUM   Take 10 mg by mouth every 6 (six) hours as needed. For spasms      lisinopril-hydrochlorothiazide 20-25 MG per tablet   Commonly known as: PRINZIDE,ZESTORETIC   Take 1 tablet by mouth daily.      oxyCODONE 15 MG immediate release tablet   Commonly known as: ROXICODONE   Take 15 mg by mouth every 4 (four) hours as needed. For pain         Signed: Karn Cassis 04/04/2012, 9:47 AM

## 2012-04-04 NOTE — Progress Notes (Signed)
Pt d/c to home by car with family. Assessment stable. Pt verbalizes understanding of d/c instructions. 

## 2012-05-07 ENCOUNTER — Ambulatory Visit: Payer: Managed Care, Other (non HMO) | Attending: Neurosurgery | Admitting: Physical Therapy

## 2012-05-07 DIAGNOSIS — M545 Low back pain, unspecified: Secondary | ICD-10-CM | POA: Insufficient documentation

## 2012-05-07 DIAGNOSIS — IMO0001 Reserved for inherently not codable concepts without codable children: Secondary | ICD-10-CM | POA: Insufficient documentation

## 2012-05-07 DIAGNOSIS — M2560 Stiffness of unspecified joint, not elsewhere classified: Secondary | ICD-10-CM | POA: Insufficient documentation

## 2012-05-14 ENCOUNTER — Ambulatory Visit: Payer: Managed Care, Other (non HMO) | Admitting: Rehabilitation

## 2012-05-16 ENCOUNTER — Ambulatory Visit: Payer: Managed Care, Other (non HMO) | Admitting: Rehabilitation

## 2012-05-21 ENCOUNTER — Ambulatory Visit: Payer: Managed Care, Other (non HMO) | Attending: Neurosurgery | Admitting: Rehabilitation

## 2012-05-21 DIAGNOSIS — IMO0001 Reserved for inherently not codable concepts without codable children: Secondary | ICD-10-CM | POA: Insufficient documentation

## 2012-05-21 DIAGNOSIS — M545 Low back pain, unspecified: Secondary | ICD-10-CM | POA: Insufficient documentation

## 2012-05-21 DIAGNOSIS — M2560 Stiffness of unspecified joint, not elsewhere classified: Secondary | ICD-10-CM | POA: Insufficient documentation

## 2012-05-23 ENCOUNTER — Ambulatory Visit: Payer: Managed Care, Other (non HMO) | Admitting: Rehabilitation

## 2012-05-28 ENCOUNTER — Ambulatory Visit: Payer: Managed Care, Other (non HMO) | Admitting: Rehabilitation

## 2012-05-30 ENCOUNTER — Ambulatory Visit: Payer: Managed Care, Other (non HMO) | Admitting: Rehabilitation

## 2012-06-04 ENCOUNTER — Ambulatory Visit: Payer: Managed Care, Other (non HMO) | Admitting: Physical Therapy

## 2012-06-06 ENCOUNTER — Ambulatory Visit: Payer: Managed Care, Other (non HMO) | Admitting: Physical Therapy

## 2012-06-11 ENCOUNTER — Ambulatory Visit: Payer: Managed Care, Other (non HMO) | Attending: Neurosurgery | Admitting: Physical Therapy

## 2012-06-11 DIAGNOSIS — M545 Low back pain, unspecified: Secondary | ICD-10-CM | POA: Insufficient documentation

## 2012-06-11 DIAGNOSIS — M2560 Stiffness of unspecified joint, not elsewhere classified: Secondary | ICD-10-CM | POA: Insufficient documentation

## 2012-06-11 DIAGNOSIS — IMO0001 Reserved for inherently not codable concepts without codable children: Secondary | ICD-10-CM | POA: Insufficient documentation

## 2012-06-13 ENCOUNTER — Ambulatory Visit: Payer: Managed Care, Other (non HMO) | Admitting: Physical Therapy

## 2012-06-18 ENCOUNTER — Ambulatory Visit: Payer: Managed Care, Other (non HMO) | Admitting: Physical Therapy

## 2012-07-09 ENCOUNTER — Ambulatory Visit: Payer: Managed Care, Other (non HMO) | Admitting: Rehabilitation

## 2012-07-11 ENCOUNTER — Ambulatory Visit: Payer: Managed Care, Other (non HMO) | Attending: Neurosurgery | Admitting: Physical Therapy

## 2012-07-11 DIAGNOSIS — M545 Low back pain, unspecified: Secondary | ICD-10-CM | POA: Insufficient documentation

## 2012-07-11 DIAGNOSIS — IMO0001 Reserved for inherently not codable concepts without codable children: Secondary | ICD-10-CM | POA: Insufficient documentation

## 2012-07-11 DIAGNOSIS — M2560 Stiffness of unspecified joint, not elsewhere classified: Secondary | ICD-10-CM | POA: Insufficient documentation

## 2012-07-16 ENCOUNTER — Ambulatory Visit: Payer: Managed Care, Other (non HMO) | Admitting: Rehabilitation

## 2012-07-18 ENCOUNTER — Ambulatory Visit: Payer: Managed Care, Other (non HMO) | Admitting: Physical Therapy

## 2012-07-23 ENCOUNTER — Ambulatory Visit: Payer: Managed Care, Other (non HMO) | Admitting: Rehabilitation

## 2012-07-25 ENCOUNTER — Ambulatory Visit: Payer: Managed Care, Other (non HMO) | Admitting: Physical Therapy

## 2013-05-05 ENCOUNTER — Emergency Department (HOSPITAL_BASED_OUTPATIENT_CLINIC_OR_DEPARTMENT_OTHER): Payer: Self-pay

## 2013-05-05 ENCOUNTER — Inpatient Hospital Stay (HOSPITAL_BASED_OUTPATIENT_CLINIC_OR_DEPARTMENT_OTHER)
Admission: EM | Admit: 2013-05-05 | Discharge: 2013-05-15 | DRG: 152 | Disposition: A | Discharge status: Home / Self Care | Payer: Self-pay | Attending: Internal Medicine | Admitting: Internal Medicine

## 2013-05-05 ENCOUNTER — Encounter (HOSPITAL_BASED_OUTPATIENT_CLINIC_OR_DEPARTMENT_OTHER): Payer: Self-pay | Admitting: Emergency Medicine

## 2013-05-05 DIAGNOSIS — J9601 Acute respiratory failure with hypoxia: Secondary | ICD-10-CM

## 2013-05-05 DIAGNOSIS — G4733 Obstructive sleep apnea (adult) (pediatric): Secondary | ICD-10-CM | POA: Diagnosis present

## 2013-05-05 DIAGNOSIS — Z9119 Patient's noncompliance with other medical treatment and regimen: Secondary | ICD-10-CM

## 2013-05-05 DIAGNOSIS — F10931 Alcohol use, unspecified with withdrawal delirium: Secondary | ICD-10-CM

## 2013-05-05 DIAGNOSIS — J392 Other diseases of pharynx: Secondary | ICD-10-CM | POA: Diagnosis present

## 2013-05-05 DIAGNOSIS — E669 Obesity, unspecified: Secondary | ICD-10-CM | POA: Diagnosis present

## 2013-05-05 DIAGNOSIS — J36 Peritonsillar abscess: Principal | ICD-10-CM | POA: Diagnosis present

## 2013-05-05 DIAGNOSIS — R590 Localized enlarged lymph nodes: Secondary | ICD-10-CM

## 2013-05-05 DIAGNOSIS — G9340 Encephalopathy, unspecified: Secondary | ICD-10-CM

## 2013-05-05 DIAGNOSIS — K5900 Constipation, unspecified: Secondary | ICD-10-CM | POA: Diagnosis present

## 2013-05-05 DIAGNOSIS — Z6841 Body Mass Index (BMI) 40.0 and over, adult: Secondary | ICD-10-CM

## 2013-05-05 DIAGNOSIS — I10 Essential (primary) hypertension: Secondary | ICD-10-CM | POA: Diagnosis present

## 2013-05-05 DIAGNOSIS — R7309 Other abnormal glucose: Secondary | ICD-10-CM | POA: Diagnosis present

## 2013-05-05 DIAGNOSIS — F1020 Alcohol dependence, uncomplicated: Secondary | ICD-10-CM | POA: Diagnosis present

## 2013-05-05 DIAGNOSIS — Z91199 Patient's noncompliance with other medical treatment and regimen due to unspecified reason: Secondary | ICD-10-CM

## 2013-05-05 DIAGNOSIS — E876 Hypokalemia: Secondary | ICD-10-CM | POA: Diagnosis present

## 2013-05-05 DIAGNOSIS — F1010 Alcohol abuse, uncomplicated: Secondary | ICD-10-CM | POA: Diagnosis present

## 2013-05-05 DIAGNOSIS — Z7982 Long term (current) use of aspirin: Secondary | ICD-10-CM

## 2013-05-05 DIAGNOSIS — F10231 Alcohol dependence with withdrawal delirium: Secondary | ICD-10-CM

## 2013-05-05 DIAGNOSIS — Z981 Arthrodesis status: Secondary | ICD-10-CM

## 2013-05-05 DIAGNOSIS — J029 Acute pharyngitis, unspecified: Secondary | ICD-10-CM | POA: Diagnosis present

## 2013-05-05 DIAGNOSIS — R0902 Hypoxemia: Secondary | ICD-10-CM

## 2013-05-05 DIAGNOSIS — J9600 Acute respiratory failure, unspecified whether with hypoxia or hypercapnia: Secondary | ICD-10-CM | POA: Diagnosis not present

## 2013-05-05 DIAGNOSIS — R599 Enlarged lymph nodes, unspecified: Secondary | ICD-10-CM

## 2013-05-05 LAB — CBC WITH DIFFERENTIAL/PLATELET
BAND NEUTROPHILS: 12 % — AB (ref 0–10)
BASOS ABS: 0 10*3/uL (ref 0.0–0.1)
BASOS PCT: 0 % (ref 0–1)
BLASTS: 0 %
Eosinophils Absolute: 0.1 10*3/uL (ref 0.0–0.7)
Eosinophils Relative: 1 % (ref 0–5)
HEMATOCRIT: 46.2 % (ref 39.0–52.0)
HEMOGLOBIN: 15.7 g/dL (ref 13.0–17.0)
LYMPHS ABS: 2 10*3/uL (ref 0.7–4.0)
LYMPHS PCT: 21 % (ref 12–46)
MCH: 32 pg (ref 26.0–34.0)
MCHC: 34 g/dL (ref 30.0–36.0)
MCV: 94.3 fL (ref 78.0–100.0)
METAMYELOCYTES PCT: 0 %
MYELOCYTES: 0 %
Monocytes Absolute: 0.9 10*3/uL (ref 0.1–1.0)
Monocytes Relative: 9 % (ref 3–12)
Neutro Abs: 6.6 10*3/uL (ref 1.7–7.7)
Neutrophils Relative %: 57 % (ref 43–77)
PROMYELOCYTES ABS: 0 %
Platelets: 244 10*3/uL (ref 150–400)
RBC: 4.9 MIL/uL (ref 4.22–5.81)
RDW: 12.5 % (ref 11.5–15.5)
WBC: 9.6 10*3/uL (ref 4.0–10.5)
nRBC: 0 /100 WBC

## 2013-05-05 LAB — BASIC METABOLIC PANEL
BUN: 4 mg/dL — AB (ref 6–23)
CHLORIDE: 99 meq/L (ref 96–112)
CO2: 28 mEq/L (ref 19–32)
CREATININE: 0.6 mg/dL (ref 0.50–1.35)
Calcium: 9.5 mg/dL (ref 8.4–10.5)
GFR calc non Af Amer: >90 mL/min (ref >90)
GLUCOSE: 112 mg/dL — AB (ref 70–99)
POTASSIUM: 3.9 meq/L (ref 3.7–5.3)
Sodium: 141 mEq/L (ref 137–147)

## 2013-05-05 LAB — HEMOGLOBIN A1C
Hgb A1c MFr Bld: 6.1 % — ABNORMAL HIGH
Mean Plasma Glucose: 128 mg/dL — ABNORMAL HIGH

## 2013-05-05 LAB — INFLUENZA PANEL BY PCR (TYPE A & B)
H1N1 flu by pcr: NOT DETECTED
INFLBPCR: NEGATIVE
Influenza A By PCR: NEGATIVE

## 2013-05-05 LAB — MRSA PCR SCREENING: MRSA by PCR: NEGATIVE

## 2013-05-05 LAB — MONONUCLEOSIS SCREEN: MONO SCREEN: NEGATIVE

## 2013-05-05 LAB — RAPID STREP SCREEN (MED CTR MEBANE ONLY): STREPTOCOCCUS, GROUP A SCREEN (DIRECT): NEGATIVE

## 2013-05-05 MED ORDER — SODIUM CHLORIDE 0.9 % IV SOLN
3.0000 g | Freq: Once | INTRAVENOUS | Status: AC
  Administered 2013-05-05: 3 g via INTRAVENOUS
  Filled 2013-05-05: qty 3

## 2013-05-05 MED ORDER — HYDROCHLOROTHIAZIDE 25 MG PO TABS
25.0000 mg | ORAL_TABLET | Freq: Every day | ORAL | Status: DC
  Filled 2013-05-05: qty 1

## 2013-05-05 MED ORDER — LISINOPRIL 10 MG PO TABS
ORAL_TABLET | ORAL | Status: AC
  Administered 2013-05-05: 20 mg
  Filled 2013-05-05: qty 2

## 2013-05-05 MED ORDER — IOHEXOL 300 MG/ML  SOLN
75.0000 mL | Freq: Once | INTRAMUSCULAR | Status: AC | PRN
  Administered 2013-05-05: 75 mL via INTRAVENOUS

## 2013-05-05 MED ORDER — HYDROCHLOROTHIAZIDE 25 MG PO TABS
ORAL_TABLET | ORAL | Status: AC
  Administered 2013-05-05: 25 mg
  Filled 2013-05-05: qty 1

## 2013-05-05 MED ORDER — FUROSEMIDE 10 MG/ML IJ SOLN
40.0000 mg | Freq: Once | INTRAMUSCULAR | Status: AC
  Administered 2013-05-05: 40 mg via INTRAVENOUS
  Filled 2013-05-05: qty 4

## 2013-05-05 MED ORDER — HYDROMORPHONE HCL PF 1 MG/ML IJ SOLN
0.5000 mg | Freq: Once | INTRAMUSCULAR | Status: DC
  Filled 2013-05-05: qty 1

## 2013-05-05 MED ORDER — SODIUM CHLORIDE 0.9 % IV SOLN
3.0000 g | Freq: Four times a day (QID) | INTRAVENOUS | Status: DC
  Administered 2013-05-05 – 2013-05-12 (×28): 3 g via INTRAVENOUS
  Filled 2013-05-05 (×31): qty 3

## 2013-05-05 MED ORDER — FENTANYL CITRATE 0.05 MG/ML IJ SOLN
75.0000 ug | Freq: Once | INTRAMUSCULAR | Status: AC
  Administered 2013-05-05: 75 ug via INTRAVENOUS
  Filled 2013-05-05: qty 2

## 2013-05-05 MED ORDER — ONDANSETRON HCL 4 MG PO TABS: 4.0000 mg | ORAL_TABLET | Freq: Four times a day (QID) | ORAL | Status: DC | PRN

## 2013-05-05 MED ORDER — LABETALOL HCL 5 MG/ML IV SOLN
10.0000 mg | Freq: Once | INTRAVENOUS | Status: AC
  Administered 2013-05-05: 10 mg via INTRAVENOUS
  Filled 2013-05-05: qty 4

## 2013-05-05 MED ORDER — SODIUM CHLORIDE 0.9 % IV SOLN: INTRAVENOUS | Status: DC

## 2013-05-05 MED ORDER — CLINDAMYCIN HCL 300 MG PO CAPS: 300.0000 mg | ORAL_CAPSULE | Freq: Three times a day (TID) | ORAL | Status: DC

## 2013-05-05 MED ORDER — MORPHINE SULFATE 2 MG/ML IJ SOLN: 2.0000 mg | INTRAMUSCULAR | Status: DC | PRN

## 2013-05-05 MED ORDER — HEPARIN SODIUM (PORCINE) 5000 UNIT/ML IJ SOLN
5000.0000 [IU] | Freq: Three times a day (TID) | INTRAMUSCULAR | Status: DC
  Administered 2013-05-05 – 2013-05-06 (×5): 5000 [IU] via SUBCUTANEOUS
  Filled 2013-05-05 (×10): qty 1

## 2013-05-05 MED ORDER — ACETAMINOPHEN 650 MG RE SUPP: 650.0000 mg | Freq: Four times a day (QID) | RECTAL | Status: DC | PRN

## 2013-05-05 MED ORDER — LABETALOL HCL 5 MG/ML IV SOLN
10.0000 mg | INTRAVENOUS | Status: DC | PRN
  Administered 2013-05-05: 10 mg via INTRAVENOUS
  Filled 2013-05-05 (×2): qty 4

## 2013-05-05 MED ORDER — LISINOPRIL-HYDROCHLOROTHIAZIDE 20-25 MG PO TABS: 1.0000 | ORAL_TABLET | Freq: Every day | ORAL | Status: DC

## 2013-05-05 MED ORDER — IOHEXOL 300 MG/ML  SOLN
80.0000 mL | Freq: Once | INTRAMUSCULAR | Status: AC | PRN
  Administered 2013-05-05: 80 mL via INTRAVENOUS

## 2013-05-05 MED ORDER — ONDANSETRON HCL 4 MG/2ML IJ SOLN: 4.0000 mg | Freq: Four times a day (QID) | INTRAMUSCULAR | Status: DC | PRN

## 2013-05-05 MED ORDER — ONDANSETRON HCL 4 MG/2ML IJ SOLN: 4.0000 mg | Freq: Three times a day (TID) | INTRAMUSCULAR | Status: DC | PRN

## 2013-05-05 MED ORDER — SODIUM CHLORIDE 0.9 % IJ SOLN
3.0000 mL | Freq: Two times a day (BID) | INTRAMUSCULAR | Status: DC
  Administered 2013-05-05 – 2013-05-14 (×11): 3 mL via INTRAVENOUS

## 2013-05-05 MED ORDER — AMLODIPINE BESYLATE 10 MG PO TABS
10.0000 mg | ORAL_TABLET | Freq: Every day | ORAL | Status: DC
  Administered 2013-05-05 – 2013-05-06 (×2): 10 mg via ORAL
  Filled 2013-05-05 (×2): qty 1

## 2013-05-05 MED ORDER — ACETAMINOPHEN 325 MG PO TABS: 650.0000 mg | ORAL_TABLET | Freq: Four times a day (QID) | ORAL | Status: DC | PRN

## 2013-05-05 MED ORDER — LIDOCAINE HCL 2 % IJ SOLN
5.0000 mL | Freq: Once | INTRAMUSCULAR | Status: DC
  Filled 2013-05-05: qty 20

## 2013-05-05 MED ORDER — DEXAMETHASONE SODIUM PHOSPHATE 10 MG/ML IJ SOLN
10.0000 mg | Freq: Once | INTRAMUSCULAR | Status: AC
  Administered 2013-05-05: 10 mg via INTRAVENOUS
  Filled 2013-05-05: qty 1

## 2013-05-05 MED ORDER — METHYLPREDNISOLONE SODIUM SUCC 125 MG IJ SOLR
80.0000 mg | Freq: Three times a day (TID) | INTRAMUSCULAR | Status: DC
  Administered 2013-05-05 – 2013-05-06 (×4): 80 mg via INTRAVENOUS
  Filled 2013-05-05: qty 2
  Filled 2013-05-05 (×3): qty 1.28
  Filled 2013-05-05 (×2): qty 2

## 2013-05-05 MED ORDER — LISINOPRIL 20 MG PO TABS
20.0000 mg | ORAL_TABLET | Freq: Every day | ORAL | Status: DC
  Filled 2013-05-05: qty 1

## 2013-05-05 MED ORDER — LABETALOL HCL 5 MG/ML IV SOLN: 20.0000 mg | Freq: Once | INTRAVENOUS | Status: DC

## 2013-05-05 MED ORDER — FAMOTIDINE IN NACL 20-0.9 MG/50ML-% IV SOLN
20.0000 mg | Freq: Two times a day (BID) | INTRAVENOUS | Status: DC
  Administered 2013-05-05: 20 mg via INTRAVENOUS
  Filled 2013-05-05 (×4): qty 50

## 2013-05-05 MED ORDER — SODIUM CHLORIDE 0.9 % IV BOLUS (SEPSIS)
1000.0000 mL | Freq: Once | INTRAVENOUS | Status: AC
  Administered 2013-05-05: 1000 mL via INTRAVENOUS

## 2013-05-05 NOTE — ED Notes (Signed)
Pt updated on delay.  Plan to admit the pt now due to possible airway compromise.  pts speech garbled, snoring respirations, difficulty maintains secretions when sleeping.  Pt denies feeling worse than when he got here.  Family remains at bedside.  Call light in reach and pt on continuous monitoring.

## 2013-05-05 NOTE — Discharge Instructions (Signed)
Go directly to ENT office at [facility] at [address]. Phone [phone]. The ENT doctor is expecting you this morning. Take your blood pressure medicines as directed, you are higher risk for stroke and heart attack. Follow up with primary doctor for sleep apnea testing.  If you were given medicines take as directed.  If you are on coumadin or contraceptives realize their levels and effectiveness is altered by many different medicines.  If you have any reaction (rash, tongues swelling, other) to the medicines stop taking and see a physician.   Please follow up as directed and return to the ER or see a physician for new or worsening symptoms.  Thank you.    Peritonsillar Abscess A peritonsillar abscess is a collection of pus located in the back of the throat behind the tonsils. It usually occurs when a streptococcal infection of the throat or tonsils spreads into the space around the tonsils. They are almost always caused by the streptococcal germ (bacteria). The treatment of a peritonsillar abscess is most often drainage accomplished by putting a needle into the abscess or cutting (incising) and draining the abscess. This is most often followed with a course of antibiotics. HOME CARE INSTRUCTIONS  If your abscess was drained by your caregiver today, rinse your throat (gargle) with warm salt water four times per day or as needed for comfort. Do not swallow this mixture. Mix 1 teaspoon of salt in 8 ounces of warm water for gargling.  Rest in bed as needed. Resume activities as able.  Apply cold to your neck for pain relief. Fill a plastic bag with ice and wrap it in a towel. Hold the ice on your neck for 20 minutes 4 times per day.  Eat a soft or liquid diet as tolerated while your throat remains sore. Popsicles and ice cream may be good early choices. Drinking plenty of cold fluids will probably be soothing and help take swelling down in between the warm gargles.  Only take  over-the-counter or prescription medicines for pain, discomfort, or fever as directed by your caregiver. Do not use aspirin unless directed by your physician. Aspirin slows down the clotting process. It can also cause bleeding from the drainage area if this was needled or incised today.  If antibiotics were prescribed, take them as directed for the full course of the prescription. Even if you feel you are well, you need to take them. SEEK MEDICAL CARE IF:   You have increased pain, swelling, redness, or drainage in your throat.  You develop signs of infection such as dizziness, headache, lethargy, or generalized feelings of illness.  You have difficulty breathing, swallowing or eating.  You show signs of becoming dehydrated (lightheadedness when standing, decreased urine output, a fast heart rate, or dry mouth and mucous membranes). SEEK IMMEDIATE MEDICAL CARE IF:   You have a fever.  You are coughing up or vomiting blood.  You develop more severe throat pain uncontrolled with medicines or you start to drool.  You develop difficulty breathing, talking, or find it easier to breathe while leaning forward. Document Released: 02/27/2005 Document Revised: 05/22/2011 Document Reviewed: 10/11/2007 [facility] Patient Information 2014 [place].

## 2013-05-05 NOTE — ED Notes (Signed)
Pt left via [facility].  pts family given directions to [facility].

## 2013-05-05 NOTE — Progress Notes (Signed)
Patient from [facility]: Obese, HTN, prob undiagnosed OSA -came in with sore throat- strep/mono pending CT scan showed  Small areas of possible abscess -spoke with ENT- nothing to drain but per ER doc would recommend observation -given decadron and unasyn -2L of O2 as patient drops into 80s while sleeping  Eulogio Bear

## 2013-05-05 NOTE — ED Notes (Addendum)
Pt states that he has been off lisinopril for a year. Pt states Thursday he was started with a sore throat. Pt noticed that he had blisters on the back of his throat and on the top of his mouth. Pt states that he popped the blisters on the top of his mouth. Pt today started having SOB, throat swelling, and edema around his throat. Pt has garbled speech, pt states difficulty swallowing due to throat swelling.
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
## 2013-05-05 NOTE — ED Provider Notes (Addendum)
8:30 AM  Assumed care from Dr. Reather Converse.  Pt is a 43 y.o. M with HTN and likely undiagnosed sleep apnea who presents emergency Department with sore throat since Thursday. Patient has also had muffled voice and difficulty swallowing his secretions. No difficulty breathing.  Patient's labs showed bandemia. Strep test was negative. CT of his neck shows a9 mm focal fluid collection within the midline submucosal nasopharynx that may be a cyst versus small abscess. There is also a 9 mm hypodensity of the right palatine tonsil which is equivocal for abscess. There is also a small retropharyngeal fusion and cervical lymphadenopathy which may be reactive. Given these 2 fluid collections are very small, discussed with Dr. Redmond Baseman with ENT who does not feel they are amendable to drainage.  Given there is concern for patient's airway given his likely undiagnosed sleep apnea and pharyngeal edema, will admit for observation to hospitalist service. He has received Decadron and Unasyn. ENT will follow as needed.  Patient does not have a primary care physician. Patient is comfortable with this plan.  8:43 AM  Spoke with Dr. Eliseo Squires with hospitalist for admission to stepdown.  10:28 AM  Pt stable.  Awaiting stepdown bed at [facility].  Denies feeling worse.  Resting comfortably but pt is sitting upright in bed.  Still on 2L [place].  Delice Bison Anyely Cunning, DO 05/05/13 [place], DO 05/05/13 [place], DO 05/05/13 1718

## 2013-05-05 NOTE — ED Notes (Addendum)
EDP Zavitz updated on pt status; Kassie, Therapist, sports at bedside

## 2013-05-05 NOTE — ED Notes (Signed)
EDP discussed plan of care with patient and patient's significant other, EDP wants to hold patient here in ED until 07:30am and then discharge him to the ENT specialist in the AM, appointment for 08:30am. Pt agrees and is attempting to get rest.

## 2013-05-05 NOTE — Progress Notes (Signed)
Utilization Review Completed.Donne Anon T2/23/2015
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
## 2013-05-05 NOTE — Progress Notes (Signed)
Admitted from med center in [place] by [facility], awake and alert, not in any resp. distress, voice is muffled. Claimed to feel much better than yest. Continue to monitor.

## 2013-05-05 NOTE — Progress Notes (Signed)
After swallowing norvasc  complained that the pill stucked on his throat. Not in respiratory distress. abled to cough it out, claimed small piece of white pill came out. Claimed he feels much better. Advised to have pills be crushed .
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
## 2013-05-06 DIAGNOSIS — I10 Essential (primary) hypertension: Secondary | ICD-10-CM

## 2013-05-06 LAB — CBC
HCT: 45.5 % (ref 39.0–52.0)
Hemoglobin: 15.7 g/dL (ref 13.0–17.0)
MCH: 32.4 pg (ref 26.0–34.0)
MCHC: 34.5 g/dL (ref 30.0–36.0)
MCV: 94 fL (ref 78.0–100.0)
PLATELETS: 345 10*3/uL (ref 150–400)
RBC: 4.84 MIL/uL (ref 4.22–5.81)
RDW: 13 % (ref 11.5–15.5)
WBC: 14.4 10*3/uL — ABNORMAL HIGH (ref 4.0–10.5)

## 2013-05-06 LAB — BASIC METABOLIC PANEL
BUN: 13 mg/dL (ref 6–23)
CO2: 25 meq/L (ref 19–32)
CREATININE: 0.63 mg/dL (ref 0.50–1.35)
Calcium: 9.2 mg/dL (ref 8.4–10.5)
Chloride: 99 mEq/L (ref 96–112)
GFR calc Af Amer: >90 mL/min (ref >90)
GFR calc non Af Amer: >90 mL/min (ref >90)
Glucose, Bld: 157 mg/dL — ABNORMAL HIGH (ref 70–99)
Potassium: 3.7 mEq/L (ref 3.7–5.3)
Sodium: 140 mEq/L (ref 137–147)

## 2013-05-06 LAB — CULTURE, GROUP A STREP

## 2013-05-06 MED ORDER — ADULT MULTIVITAMIN W/MINERALS CH: 1.0000 | ORAL_TABLET | Freq: Every day | ORAL | Status: DC

## 2013-05-06 MED ORDER — VITAMIN B-1 100 MG PO TABS: 100.0000 mg | ORAL_TABLET | Freq: Every day | ORAL | Status: DC

## 2013-05-06 MED ORDER — FAMOTIDINE 20 MG PO TABS
20.0000 mg | ORAL_TABLET | Freq: Two times a day (BID) | ORAL | Status: DC
  Administered 2013-05-06 (×2): 20 mg via ORAL
  Filled 2013-05-06 (×3): qty 1

## 2013-05-06 MED ORDER — METHYLPREDNISOLONE SODIUM SUCC 125 MG IJ SOLR
80.0000 mg | Freq: Two times a day (BID) | INTRAMUSCULAR | Status: DC
  Administered 2013-05-07 – 2013-05-08 (×3): 80 mg via INTRAVENOUS
  Filled 2013-05-06: qty 2
  Filled 2013-05-06 (×5): qty 1.28

## 2013-05-06 MED ORDER — LORAZEPAM 2 MG/ML IJ SOLN
1.0000 mg | INTRAMUSCULAR | Status: DC | PRN
  Administered 2013-05-08 – 2013-05-11 (×22): 2 mg via INTRAVENOUS
  Filled 2013-05-06 (×23): qty 1

## 2013-05-06 MED ORDER — ADULT MULTIVITAMIN W/MINERALS CH
1.0000 | ORAL_TABLET | Freq: Every day | ORAL | Status: DC
  Administered 2013-05-06: 1 via ORAL
  Filled 2013-05-06: qty 1

## 2013-05-06 MED ORDER — LORAZEPAM 2 MG/ML IJ SOLN
1.0000 mg | Freq: Four times a day (QID) | INTRAMUSCULAR | Status: DC | PRN
  Filled 2013-05-06: qty 1

## 2013-05-06 MED ORDER — FOLIC ACID 1 MG PO TABS: 1.0000 mg | ORAL_TABLET | Freq: Every day | ORAL | Status: DC

## 2013-05-06 MED ORDER — LORAZEPAM 2 MG/ML IJ SOLN
2.0000 mg | Freq: Once | INTRAMUSCULAR | Status: AC
Start: 2013-05-06 — End: 2013-05-06
  Administered 2013-05-06: 2 mg via INTRAVENOUS
  Filled 2013-05-06: qty 1

## 2013-05-06 MED ORDER — LORAZEPAM 2 MG/ML IJ SOLN
2.0000 mg | Freq: Once | INTRAMUSCULAR | Status: AC
  Administered 2013-05-06: 2 mg via INTRAVENOUS

## 2013-05-06 MED ORDER — VITAMIN B-1 100 MG PO TABS
100.0000 mg | ORAL_TABLET | Freq: Every day | ORAL | Status: DC
  Administered 2013-05-06: 100 mg via ORAL
  Filled 2013-05-06: qty 1

## 2013-05-06 MED ORDER — LORAZEPAM 1 MG PO TABS
1.0000 mg | ORAL_TABLET | Freq: Four times a day (QID) | ORAL | Status: DC | PRN
  Administered 2013-05-06 (×3): 1 mg via ORAL
  Filled 2013-05-06 (×3): qty 1

## 2013-05-06 MED ORDER — FOLIC ACID 1 MG PO TABS
1.0000 mg | ORAL_TABLET | Freq: Every day | ORAL | Status: DC
  Administered 2013-05-06: 1 mg via ORAL
  Filled 2013-05-06: qty 1

## 2013-05-06 MED ORDER — DEXMEDETOMIDINE BOLUS VIA INFUSION
1.0000 ug/kg | Freq: Once | INTRAVENOUS | Status: AC
  Administered 2013-05-07: 147.6 ug via INTRAVENOUS
  Filled 2013-05-06: qty 148

## 2013-05-06 MED ORDER — HALOPERIDOL LACTATE 5 MG/ML IJ SOLN
5.0000 mg | Freq: Once | INTRAMUSCULAR | Status: AC
  Administered 2013-05-06: 5 mg via INTRAVENOUS
  Filled 2013-05-06: qty 1

## 2013-05-06 MED ORDER — THIAMINE HCL 100 MG/ML IJ SOLN
100.0000 mg | Freq: Every day | INTRAMUSCULAR | Status: DC
  Filled 2013-05-06: qty 1

## 2013-05-06 MED ORDER — LORAZEPAM 2 MG/ML IJ SOLN
2.0000 mg | Freq: Once | INTRAMUSCULAR | Status: AC
  Administered 2013-05-06: 2 mg via INTRAVENOUS
  Filled 2013-05-06: qty 1

## 2013-05-06 MED ORDER — DEXMEDETOMIDINE HCL IN NACL 200 MCG/50ML IV SOLN
0.4000 ug/kg/h | INTRAVENOUS | Status: DC
  Administered 2013-05-07 (×2): 1.2 ug/kg/h via INTRAVENOUS
  Administered 2013-05-07: 1 ug/kg/h via INTRAVENOUS
  Filled 2013-05-06 (×5): qty 50

## 2013-05-06 MED ORDER — FENTANYL CITRATE 0.05 MG/ML IJ SOLN
50.0000 ug | INTRAMUSCULAR | Status: DC | PRN
  Administered 2013-05-06 (×3): 50 ug via INTRAVENOUS
  Filled 2013-05-06 (×3): qty 2
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
## 2013-05-06 NOTE — Progress Notes (Signed)
Patient is distrustful and refuses artivan.
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
## 2013-05-07 ENCOUNTER — Inpatient Hospital Stay (HOSPITAL_COMMUNITY): Payer: Self-pay

## 2013-05-07 DIAGNOSIS — J9600 Acute respiratory failure, unspecified whether with hypoxia or hypercapnia: Secondary | ICD-10-CM

## 2013-05-07 DIAGNOSIS — J9601 Acute respiratory failure with hypoxia: Secondary | ICD-10-CM

## 2013-05-07 DIAGNOSIS — G9340 Encephalopathy, unspecified: Secondary | ICD-10-CM

## 2013-05-07 DIAGNOSIS — F10231 Alcohol dependence with withdrawal delirium: Secondary | ICD-10-CM

## 2013-05-07 DIAGNOSIS — F10931 Alcohol use, unspecified with withdrawal delirium: Secondary | ICD-10-CM

## 2013-05-07 LAB — BLOOD GAS, ARTERIAL
Acid-Base Excess: 3.2 mmol/L — ABNORMAL HIGH (ref 0.0–2.0)
Bicarbonate: 29 mEq/L — ABNORMAL HIGH (ref 20.0–24.0)
DRAWN BY: 39899
FIO2: 1 %
O2 Saturation: 99.4 %
PEEP: 8 cmH2O
Patient temperature: 98.6
RATE: 14 resp/min
TCO2: 30.8 mmol/L (ref 0–100)
VT: 580 mL
pCO2 arterial: 59.8 mmHg (ref 35.0–45.0)
pH, Arterial: 7.306 — ABNORMAL LOW (ref 7.350–7.450)
pO2, Arterial: 236 mmHg — ABNORMAL HIGH (ref 80.0–100.0)

## 2013-05-07 LAB — COMPREHENSIVE METABOLIC PANEL
ALT: 43 U/L (ref 0–53)
AST: 28 U/L (ref 0–37)
Albumin: 3.3 g/dL — ABNORMAL LOW (ref 3.5–5.2)
Alkaline Phosphatase: 59 U/L (ref 39–117)
BUN: 21 mg/dL (ref 6–23)
CO2: 22 mEq/L (ref 19–32)
Calcium: 8.8 mg/dL (ref 8.4–10.5)
Chloride: 99 mEq/L (ref 96–112)
Creatinine, Ser: 0.76 mg/dL (ref 0.50–1.35)
GFR calc Af Amer: >90 mL/min (ref >90)
GFR calc non Af Amer: >90 mL/min (ref >90)
Glucose, Bld: 215 mg/dL — ABNORMAL HIGH (ref 70–99)
Potassium: 4.2 mEq/L (ref 3.7–5.3)
Sodium: 141 mEq/L (ref 137–147)
Total Bilirubin: 0.5 mg/dL (ref 0.3–1.2)
Total Protein: 8.6 g/dL — ABNORMAL HIGH (ref 6.0–8.3)

## 2013-05-07 LAB — GLUCOSE, CAPILLARY: Glucose-Capillary: 129 mg/dL — ABNORMAL HIGH (ref 70–99)

## 2013-05-07 LAB — CBC
HCT: 48.3 % (ref 39.0–52.0)
Hemoglobin: 16.1 g/dL (ref 13.0–17.0)
MCH: 32.5 pg (ref 26.0–34.0)
MCHC: 33.3 g/dL (ref 30.0–36.0)
MCV: 97.6 fL (ref 78.0–100.0)
Platelets: 369 10*3/uL (ref 150–400)
RBC: 4.95 MIL/uL (ref 4.22–5.81)
RDW: 13 % (ref 11.5–15.5)
WBC: 17.8 10*3/uL — ABNORMAL HIGH (ref 4.0–10.5)

## 2013-05-07 LAB — TRIGLYCERIDES: Triglycerides: 179 mg/dL — ABNORMAL HIGH (ref <150)

## 2013-05-07 MED ORDER — FOLIC ACID 5 MG/ML IJ SOLN
1.0000 mg | Freq: Once | INTRAMUSCULAR | Status: AC
  Administered 2013-05-07: 1 mg via INTRAVENOUS
  Filled 2013-05-07: qty 0.2

## 2013-05-07 MED ORDER — PANTOPRAZOLE SODIUM 40 MG IV SOLR
40.0000 mg | Freq: Every day | INTRAVENOUS | Status: DC
  Administered 2013-05-07: 40 mg via INTRAVENOUS
  Filled 2013-05-07 (×2): qty 40

## 2013-05-07 MED ORDER — MIDAZOLAM HCL 2 MG/2ML IJ SOLN
INTRAMUSCULAR | Status: AC
  Filled 2013-05-07: qty 4

## 2013-05-07 MED ORDER — ROCURONIUM BROMIDE 50 MG/5ML IV SOLN
50.0000 mg | Freq: Once | INTRAVENOUS | Status: AC
  Administered 2013-05-07: 50 mg via INTRAVENOUS

## 2013-05-07 MED ORDER — SODIUM CHLORIDE 0.9 % IV SOLN: 1.0000 mg | Freq: Once | INTRAVENOUS | Status: DC

## 2013-05-07 MED ORDER — FENTANYL CITRATE 0.05 MG/ML IJ SOLN
100.0000 ug | Freq: Once | INTRAMUSCULAR | Status: AC
  Administered 2013-05-07: 100 ug via INTRAVENOUS

## 2013-05-07 MED ORDER — MIDAZOLAM HCL 2 MG/2ML IJ SOLN
INTRAMUSCULAR | Status: AC
  Filled 2013-05-07: qty 2

## 2013-05-07 MED ORDER — THIAMINE HCL 100 MG/ML IJ SOLN
100.0000 mg | Freq: Every day | INTRAMUSCULAR | Status: DC
  Administered 2013-05-07 – 2013-05-13 (×7): 100 mg via INTRAVENOUS
  Filled 2013-05-07 (×9): qty 1

## 2013-05-07 MED ORDER — BIOTENE DRY MOUTH MT LIQD
15.0000 mL | Freq: Four times a day (QID) | OROMUCOSAL | Status: DC
  Administered 2013-05-07 – 2013-05-13 (×24): 15 mL via OROMUCOSAL

## 2013-05-07 MED ORDER — CHLORHEXIDINE GLUCONATE 0.12 % MT SOLN
15.0000 mL | Freq: Two times a day (BID) | OROMUCOSAL | Status: DC
  Administered 2013-05-07 – 2013-05-13 (×12): 15 mL via OROMUCOSAL
  Filled 2013-05-07 (×12): qty 15

## 2013-05-07 MED ORDER — PROPOFOL 10 MG/ML IV EMUL
INTRAVENOUS | Status: AC
  Administered 2013-05-07: 30 ug/kg/min via INTRAVENOUS
  Filled 2013-05-07: qty 100

## 2013-05-07 MED ORDER — FENTANYL CITRATE 0.05 MG/ML IJ SOLN
50.0000 ug | INTRAMUSCULAR | Status: DC | PRN
  Administered 2013-05-07: 200 ug via INTRAVENOUS
  Administered 2013-05-07 – 2013-05-10 (×6): 100 ug via INTRAVENOUS
  Administered 2013-05-10: 200 ug via INTRAVENOUS
  Administered 2013-05-10 – 2013-05-11 (×6): 100 ug via INTRAVENOUS
  Administered 2013-05-12: 200 ug via INTRAVENOUS
  Administered 2013-05-12 (×2): 100 ug via INTRAVENOUS
  Administered 2013-05-13: 200 ug via INTRAVENOUS
  Administered 2013-05-13: 100 ug via INTRAVENOUS
  Filled 2013-05-07: qty 2
  Filled 2013-05-07: qty 4
  Filled 2013-05-07: qty 2
  Filled 2013-05-07: qty 4
  Filled 2013-05-07 (×4): qty 2
  Filled 2013-05-07 (×2): qty 4
  Filled 2013-05-07 (×4): qty 2
  Filled 2013-05-07: qty 4
  Filled 2013-05-07 (×4): qty 2

## 2013-05-07 MED ORDER — ETOMIDATE 2 MG/ML IV SOLN
30.0000 mg | Freq: Once | INTRAVENOUS | Status: AC
  Administered 2013-05-07: 30 mg via INTRAVENOUS

## 2013-05-07 MED ORDER — MIDAZOLAM HCL 2 MG/2ML IJ SOLN
2.0000 mg | Freq: Once | INTRAMUSCULAR | Status: AC
  Administered 2013-05-07: 2 mg via INTRAVENOUS

## 2013-05-07 MED ORDER — PROPOFOL 10 MG/ML IV EMUL
0.0000 ug/kg/min | INTRAVENOUS | Status: DC
  Administered 2013-05-07: 35 ug/kg/min via INTRAVENOUS
  Administered 2013-05-07: 20 ug/kg/min via INTRAVENOUS
  Administered 2013-05-07: 30 ug/kg/min via INTRAVENOUS
  Administered 2013-05-07: 25 ug/kg/min via INTRAVENOUS
  Administered 2013-05-08: 35 ug/kg/min via INTRAVENOUS
  Administered 2013-05-08 (×2): 40 ug/kg/min via INTRAVENOUS
  Administered 2013-05-08 (×2): 30 ug/kg/min via INTRAVENOUS
  Administered 2013-05-08: 35 ug/kg/min via INTRAVENOUS
  Administered 2013-05-08: 40 ug/kg/min via INTRAVENOUS
  Administered 2013-05-09: 35 ug/kg/min via INTRAVENOUS
  Administered 2013-05-09: 30 ug/kg/min via INTRAVENOUS
  Filled 2013-05-07 (×5): qty 100
  Filled 2013-05-07: qty 200
  Filled 2013-05-07 (×4): qty 100
  Filled 2013-05-07: qty 200
  Filled 2013-05-07 (×2): qty 100

## 2013-05-07 MED ORDER — BIOTENE DRY MOUTH MT LIQD: 15.0000 mL | Freq: Four times a day (QID) | OROMUCOSAL | Status: DC

## 2013-05-07 MED ORDER — CHLORHEXIDINE GLUCONATE 0.12 % MT SOLN
OROMUCOSAL | Status: AC
  Administered 2013-05-07: 15 mL
  Filled 2013-05-07: qty 15

## 2013-05-07 MED ORDER — HALOPERIDOL LACTATE 5 MG/ML IJ SOLN
5.0000 mg | Freq: Once | INTRAMUSCULAR | Status: AC
  Administered 2013-05-07: 5 mg via INTRAMUSCULAR

## 2013-05-07 MED ORDER — LORAZEPAM 2 MG/ML IJ SOLN
2.0000 mg | Freq: Once | INTRAMUSCULAR | Status: AC
Start: 2013-05-07 — End: 2013-05-07
  Administered 2013-05-07: 2 mg via INTRAVENOUS

## 2013-05-07 MED ORDER — FENTANYL CITRATE 0.05 MG/ML IJ SOLN
INTRAMUSCULAR | Status: AC
  Filled 2013-05-07: qty 2

## 2013-05-07 MED ORDER — SODIUM CHLORIDE 0.9 % IV SOLN
0.0000 ug/h | INTRAVENOUS | Status: DC
  Administered 2013-05-07: 50 ug/h via INTRAVENOUS
  Administered 2013-05-08: 400 ug/h via INTRAVENOUS
  Administered 2013-05-08: 100 ug/h via INTRAVENOUS
  Administered 2013-05-08: 400 ug/h via INTRAVENOUS
  Administered 2013-05-08: 200 ug/h via INTRAVENOUS
  Administered 2013-05-09: 400 ug/h via INTRAVENOUS
  Administered 2013-05-09: 30 ug/h via INTRAVENOUS
  Filled 2013-05-07 (×6): qty 50

## 2013-05-07 MED ORDER — DEXMEDETOMIDINE HCL IN NACL 400 MCG/100ML IV SOLN
0.4000 ug/kg/h | INTRAVENOUS | Status: DC
  Administered 2013-05-07: 1 ug/kg/h via INTRAVENOUS
  Administered 2013-05-07: 1.2 ug/kg/h via INTRAVENOUS
  Filled 2013-05-07: qty 100

## 2013-05-07 MED ORDER — CLONIDINE HCL 0.2 MG/24HR TD PTWK
0.2000 mg | MEDICATED_PATCH | TRANSDERMAL | Status: DC
  Administered 2013-05-07: 0.2 mg via TRANSDERMAL
  Filled 2013-05-07: qty 1

## 2013-05-07 MED ORDER — DEXTROSE-NACL 5-0.45 % IV SOLN
INTRAVENOUS | Status: DC
  Administered 2013-05-07 – 2013-05-13 (×6): via INTRAVENOUS

## 2013-05-07 MED ORDER — MIDAZOLAM HCL 2 MG/2ML IJ SOLN
4.0000 mg | Freq: Once | INTRAMUSCULAR | Status: AC
  Administered 2013-05-07: 4 mg via INTRAVENOUS

## 2013-05-07 MED ORDER — CHLORHEXIDINE GLUCONATE 0.12 % MT SOLN: 15.0000 mL | Freq: Two times a day (BID) | OROMUCOSAL | Status: DC

## 2013-05-07 MED ORDER — HALOPERIDOL LACTATE 5 MG/ML IJ SOLN
INTRAMUSCULAR | Status: AC
  Filled 2013-05-07: qty 1

## 2013-05-07 NOTE — Progress Notes (Signed)
Late entry: Received Pt at 7:30pm very agitated, has visual hallucinations. Has been given 1mg  of po ativan at 1750 without good result. Md paged and had order to give 2mg  of IV ativan. Patient closely monitored.BP 158/80 HR 113 T 98.6 O2 96% in RA. After a while there was no changes noted. Pt still agitated w/ a ciwa score of 18. Md paged again and ordered to give another dose of Ativan 2mg  IV-given to patient.No positive result noted as pt contineously walk from one pt's room to the other and packed his bag saying "I want to go home". 5mg  of IV haldol was given at 2229 as ordered by K. Schorr. Pt trying to pull out IV line and tried to push RN and NT. Security called to maintain Pt and staff safety as pt at this time is unstable on his feet. RR Rn and Md called to see and evaluate pt. 2232-Bp 131/90 Hr 119 O2 96% ra. After pt was seen by Md and RR Rn got an order to transfer to ICU. 2mg  of IV Ativan given as ordered. O2 started at 4l/ [place] as pt O2 sats goes down. Critical Md evaluated pt and report was given to receiving Rn.  Transferred to 2M08 at around 2350.Called contact person named Gar Gibbon about transfer ( said that she will call Pt's Mom)

## 2013-05-07 NOTE — Progress Notes (Signed)
Event: Multiple calls from RN regarding pt's escalating withdrawal symptoms. Minimal relief w/ multiple doses of IV Ativan and IV Haldol. Pt walking halls and going in and out of other pt's rooms. Disoriented to place and time and hallucinating. Pt has now put on his clothes and wants to leave. Security and [employer] officers are on the unit. NP to bedside. Subjective: Unable to provide reliable information given AMS. Objective: Mark Rich  is a 43 year old male w/ h/o heavy ETOH abuse (12 beers/day, last drink 2/22), HTN and untreated OSA. Pt was admitted on 2/23 w/ peritonsillar abscess (CT evaluated by ENT who advised medical rx). Shortly following admission pt developed progressive agitation and confusion initially managed w/ Ativan per (Alcohol withdrawal protocol). However hallucinations worse through-out the day w/ escalating agitation and confusion this evening that has been refractory to IV Ativan and Haldol. Upon my arrival pt noted sitting in hall with his back pack speaking w/ [employer] officers and security. Refuses to go back in his room states he needs to leave so he can go to work tomorrow. Pt oriented to self however unable to state where he is or what time it is. VS have remained stable though now more tachycardic and hypertensive w/ agitation. Ativan 2 mg IV repeated. Once able to get pt into a chair he was noted with eyes closed, loud snoring  w/ brief periods of apnea w/ desaturations into low 80's. Pt placed on 2L [place] and staff assisted pt maintain his airway.  Assessment/Plan: 1. Delirium Tremens: Confusion, hallucinations and agitation now refractory to IV Ativan and Haldol. Discussed pt w/ Dr Lake Bells w/ [facility] who has agreed to accept pt into ICU setting for Precedex drip and management of current withdrawal symptoms. 2. Hypoxia: (In setting of underlying untreated OSA and sedation) Improved w/ 02 and airway management. Pt transferred to ICU where nasal trumpet was placed by CCM provider. At the  time of my departure from ICU Precedex drip initiated and BP and tachycardia much improved. CCM to manage while on Precedex drip. Appreciate CCM input. Will follow.   Mark Columbia, NP-C [facility] Pager [phone]

## 2013-05-07 NOTE — Significant Event (Signed)
Rapid Response Event Note  Overview: Time Called: 2300 Arrival Time: 2305 Event Type: Other (Comment) (ETOH withdrawal)  Initial Focused Assessment: Called by staff RN to assist with care of pt who was transferred from [place] on 05/06/2013 with a CIWA of 14, having hallucinations and escalating withdrawal symptoms not responsive to IV ativan and Haldol.  Upon my arrival pt was ambulating in hallway with staff, agitated, confused and having visual hallucinations.  He was attempting to leave the hospital to get back to his job.  Pt not cooperative with staff, [employer] and security at bedside.   HR 120s, diaphoretic,  RR upper 20's with periods of apnea.  O2 sats on room air 86%.   hx of sleep apnea.   Interventions: Placed in [facility], returned to room and transferred to a recliner.  Placed on 4l [place].  Evaluated by Marni Griffon, NP CCM and transferred to [phone]  For placement on precedex gtt and eventual intubation.    Event Summary: Name of Physician Notified: Chaney Malling, NP at  (present upon my arrival)  Name of Consulting Physician Notified: Dr Chase Caller at 2330  Outcome: Transferred (Comment) (tx to 2MW 08 for precedex gtt and intubation)  Event End Time: 0030  Ester Rink

## 2013-05-07 NOTE — Progress Notes (Signed)
Inpatient Diabetes Program Recommendations  AACE/ADA: New Consensus Statement on Inpatient Glycemic Control (2013)  Target Ranges:  Prepandial:   less than 140 mg/dL      Peak postprandial:   less than 180 mg/dL (1-2 hours)      Critically ill patients:  140 - 180 mg/dL   Reason for Visit: Results for PIERCE, BAROCIO (MRN [id number]) as of 05/07/2013 10:13  Ref. Range 05/07/2013 06:25  Glucose Latest Range: 70-99 mg/dL 215 (H)    Diabetes history: None- A1C=6.1%.  Pt. Is on Solumedrol 80 mg q 12 hours.  Please consider checking CBG's q 4 hours. If greater than 150 mg/dL, consider adding Novolog moderate correction.     Adah Perl, RN, BC-ADM Inpatient Diabetes Coordinator Pager [phone]

## 2013-05-07 NOTE — Progress Notes (Addendum)
Pt awakened suddenly and was extremely agitated, pulling at tube and attempting to get out of bed. RAS 4 . He remained intubated, with disconnection of tubing only during event.  BP 123/74  Pulse 70  Temp(Src) 97.8 F (36.6 C) (Oral)  Resp 15  Ht 6\' 4"  (1.93 m)  Wt 332 lb 14.3 oz (151 kg)  BMI 40.54 kg/m2  SpO2 100%  Discontinue precedex now Start fentanyl gtt Start propofol gtt RAS goal -2 to -3 Per Dr. Chase Caller

## 2013-05-07 NOTE — Progress Notes (Signed)
Pt Foley cath not draining, nurse deflated balloon and reinserted, yielding gross hematuria 575 cc. SCd's ordered, sq hep stopped for now. Per report, possible traumatizing cath. CBC this morning. Will continue to monitor.  Howard Pouch DO PGY-2
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
## 2013-05-07 NOTE — Progress Notes (Signed)
eLink Physician-Brief Progress Note Patient Name: Mark Rich DOB: 05/09/1970 MRN: [id number]  Date of Service  05/07/2013   HPI/Events of Note   Agitation on precedex; DT's  eICU Interventions  Ativan x1 Limit stimulation Close monitoring, may need intubation   Intervention Category Minor Interventions: Agitation / anxiety - evaluation and management  MCQUAID, DOUGLAS 05/07/2013, 1:49 AM

## 2013-05-07 NOTE — Procedures (Addendum)
Intubation Procedure Note Mark Rich [id number] [birth year]  Procedure: Intubation Indications: DTs, Encephalopathy, Respiratory failure, Airway protection  Procedure Details Consent:  Unable to obtain consent because of due to encephalopathy Time Out: Verified patient identification, verified procedure, site/side was marked, verified correct patient position, special equipment/implants available, medications/allergies/relevent history reviewed, required imaging and test results available.  Performed  Maximum sterile technique was used including cap, gloves, gown and mask.  MAC - 4 with glidescope    Evaluation Hemodynamic Status: BP stable throughout; O2 sats: stable throughout but did fall transiently Patient's Current Condition: stable Complications: No apparent complications Patient did tolerate procedure well. Chest X-ray ordered to verify placement.  CXR: pending.   Mark Rich 05/07/2013 Dr. Brand Males, M.D., [facility].C.P Pulmonary and Critical Care Medicine Staff Physician [place] [facility] and Critical Care Pager: [phone], If no answer or between  15:00h - 7:00h: call [phone]  05/07/2013 2:55 AM    CXR showed ET tube position too high: flexible bronchoscope at bedside was used to navigate and secure the et tube 2cm above the carina  Dr. Brand Males, M.D., [facility].C.P Pulmonary and Critical Care Medicine Staff Physician [place] Pulmonary and Critical Care Pager: [phone], If no answer or between  15:00h - 7:00h: call [phone]  05/07/2013 3:16 AM

## 2013-05-07 NOTE — Progress Notes (Signed)
eLink Physician-Brief Progress Note Patient Name: Mark Rich DOB: 08/08/1970 MRN: [id number]  Date of Service  05/07/2013   HPI/Events of Note   Agitation, severe Hypoxemia > presumably OSA  eICU Interventions  Haldol now Called bedside team to intubate   Intervention Category Major Interventions: Respiratory failure - evaluation and management Minor Interventions: Agitation / anxiety - evaluation and management  Dolphus Linch 05/07/2013, 2:22 AM
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
## 2013-05-08 ENCOUNTER — Inpatient Hospital Stay (HOSPITAL_COMMUNITY): Payer: Self-pay

## 2013-05-08 DIAGNOSIS — J9600 Acute respiratory failure, unspecified whether with hypoxia or hypercapnia: Secondary | ICD-10-CM

## 2013-05-08 LAB — CBC
HCT: 41.5 % (ref 39.0–52.0)
HEMOGLOBIN: 13.9 g/dL (ref 13.0–17.0)
MCH: 32.4 pg (ref 26.0–34.0)
MCHC: 33.5 g/dL (ref 30.0–36.0)
MCV: 96.7 fL (ref 78.0–100.0)
Platelets: 289 10*3/uL (ref 150–400)
RBC: 4.29 MIL/uL (ref 4.22–5.81)
RDW: 13 % (ref 11.5–15.5)
WBC: 8.2 10*3/uL (ref 4.0–10.5)

## 2013-05-08 LAB — BASIC METABOLIC PANEL
BUN: 18 mg/dL (ref 6–23)
CHLORIDE: 103 meq/L (ref 96–112)
CO2: 28 meq/L (ref 19–32)
Calcium: 8.4 mg/dL (ref 8.4–10.5)
Creatinine, Ser: 0.66 mg/dL (ref 0.50–1.35)
GFR calc Af Amer: >90 mL/min (ref >90)
GFR calc non Af Amer: >90 mL/min (ref >90)
Glucose, Bld: 138 mg/dL — ABNORMAL HIGH (ref 70–99)
POTASSIUM: 3.5 meq/L — AB (ref 3.7–5.3)
Sodium: 141 mEq/L (ref 137–147)

## 2013-05-08 LAB — PHOSPHORUS
Phosphorus: 3.1 mg/dL (ref 2.3–4.6)
Phosphorus: 2.5 mg/dL (ref 2.3–4.6)

## 2013-05-08 LAB — MAGNESIUM
MAGNESIUM: 2.9 mg/dL — AB (ref 1.5–2.5)
MAGNESIUM: 3 mg/dL — AB (ref 1.5–2.5)

## 2013-05-08 LAB — GLUCOSE, CAPILLARY
GLUCOSE-CAPILLARY: 137 mg/dL — AB (ref 70–99)
GLUCOSE-CAPILLARY: 128 mg/dL — AB (ref 70–99)
Glucose-Capillary: 122 mg/dL — ABNORMAL HIGH (ref 70–99)
Glucose-Capillary: 145 mg/dL — ABNORMAL HIGH (ref 70–99)
Glucose-Capillary: 147 mg/dL — ABNORMAL HIGH (ref 70–99)
Glucose-Capillary: 88 mg/dL (ref 70–99)

## 2013-05-08 MED ORDER — INSULIN ASPART 100 UNIT/ML ~~LOC~~ SOLN
2.0000 [IU] | SUBCUTANEOUS | Status: DC
  Administered 2013-05-08 – 2013-05-13 (×10): 2 [IU] via SUBCUTANEOUS

## 2013-05-08 MED ORDER — PANTOPRAZOLE SODIUM 40 MG PO PACK
40.0000 mg | PACK | Freq: Every day | ORAL | Status: DC
  Administered 2013-05-08 – 2013-05-11 (×3): 40 mg
  Filled 2013-05-08 (×5): qty 20

## 2013-05-08 MED ORDER — PRO-STAT SUGAR FREE PO LIQD
60.0000 mL | Freq: Every day | ORAL | Status: DC
  Administered 2013-05-08 – 2013-05-09 (×6): 60 mL
  Filled 2013-05-08 (×10): qty 60

## 2013-05-08 MED ORDER — FOLIC ACID 1 MG PO TABS
1.0000 mg | ORAL_TABLET | Freq: Every day | ORAL | Status: DC
  Filled 2013-05-08: qty 1

## 2013-05-08 MED ORDER — ADULT MULTIVITAMIN LIQUID CH
5.0000 mL | Freq: Every day | ORAL | Status: DC
  Administered 2013-05-08 – 2013-05-13 (×5): 5 mL
  Filled 2013-05-08 (×7): qty 5

## 2013-05-08 MED ORDER — FOLIC ACID 5 MG/ML IJ SOLN
1.0000 mg | Freq: Every day | INTRAMUSCULAR | Status: DC
  Filled 2013-05-08: qty 0.2

## 2013-05-08 MED ORDER — LORAZEPAM 1 MG PO TABS
2.0000 mg | ORAL_TABLET | Freq: Three times a day (TID) | ORAL | Status: DC
  Administered 2013-05-08 – 2013-05-09 (×3): 2 mg
  Filled 2013-05-08 (×3): qty 2

## 2013-05-08 MED ORDER — LORAZEPAM 2 MG/ML PO CONC: 2.0000 mg | Freq: Three times a day (TID) | ORAL | Status: DC

## 2013-05-08 MED ORDER — BISACODYL 5 MG PO TBEC
10.0000 mg | DELAYED_RELEASE_TABLET | Freq: Every day | ORAL | Status: DC
  Administered 2013-05-08 – 2013-05-15 (×4): 10 mg via ORAL
  Filled 2013-05-08 (×5): qty 2

## 2013-05-08 MED ORDER — VITAL HIGH PROTEIN PO LIQD
1000.0000 mL | ORAL | Status: DC
  Filled 2013-05-08 (×2): qty 1000

## 2013-05-08 MED ORDER — ENOXAPARIN SODIUM 80 MG/0.8ML ~~LOC~~ SOLN
75.0000 mg | SUBCUTANEOUS | Status: DC
  Administered 2013-05-08 – 2013-05-15 (×8): 75 mg via SUBCUTANEOUS
  Filled 2013-05-08 (×8): qty 0.8

## 2013-05-08 MED ORDER — POTASSIUM CHLORIDE 20 MEQ/15ML (10%) PO LIQD
40.0000 meq | Freq: Once | ORAL | Status: AC
  Administered 2013-05-08: 40 meq via ORAL
  Filled 2013-05-08: qty 30

## 2013-05-08 MED ORDER — SODIUM CHLORIDE 0.9 % IV SOLN: 1.0000 mg | Freq: Every day | INTRAVENOUS | Status: DC

## 2013-05-08 MED ORDER — FOLIC ACID 1 MG PO TABS
1.0000 mg | ORAL_TABLET | Freq: Every day | ORAL | Status: DC
  Administered 2013-05-08 – 2013-05-15 (×6): 1 mg
  Filled 2013-05-08 (×8): qty 1

## 2013-05-08 MED ORDER — VITAL HIGH PROTEIN PO LIQD
1000.0000 mL | ORAL | Status: DC
  Administered 2013-05-08: 1000 mL
  Filled 2013-05-08 (×3): qty 1000

## 2013-05-08 MED ORDER — LORAZEPAM 1 MG PO TABS
2.0000 mg | ORAL_TABLET | Freq: Three times a day (TID) | ORAL | Status: DC
  Administered 2013-05-08: 2 mg
  Filled 2013-05-08: qty 2
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
## 2013-05-08 NOTE — Progress Notes (Signed)
Pharmacy - Re:  Lovenox for DVT prophylaxis  43yo male with multiple medical problems, currently intubated and sedated in the ICU.  He is obese and we have been asked to dose adjust his LMWH for prophylaxis.    Assessment:  CBC is stable with H/H 13.9/41.5.  He has a normal platelet count of 289K.  He is without noted bleeding complications.  He has a PMH of blood in stool but this is not recent.  He has a creatinine of 0.66 with an estimated clearance > 100 ml/min.  Plan: Will use weight adjusted dosing for him and give 0.5 mg/kg every 24 hours.   Will monitor CBC as ordered to ensure no bleeding and a stable H/H.  Rober Minion, PharmD., MS Clinical Pharmacist Pager:  [phone] Thank you for allowing pharmacy to be part of this patients care team.
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
## 2013-05-09 ENCOUNTER — Inpatient Hospital Stay (HOSPITAL_COMMUNITY): Payer: Self-pay

## 2013-05-09 DIAGNOSIS — F10231 Alcohol dependence with withdrawal delirium: Secondary | ICD-10-CM

## 2013-05-09 DIAGNOSIS — F10931 Alcohol use, unspecified with withdrawal delirium: Secondary | ICD-10-CM

## 2013-05-09 LAB — POCT I-STAT 3, ART BLOOD GAS (G3+)
ACID-BASE EXCESS: 3 mmol/L — AB (ref 0.0–2.0)
Acid-Base Excess: 3 mmol/L — ABNORMAL HIGH (ref 0.0–2.0)
Bicarbonate: 27.4 mEq/L — ABNORMAL HIGH (ref 20.0–24.0)
Bicarbonate: 27.8 mEq/L — ABNORMAL HIGH (ref 20.0–24.0)
O2 SAT: 90 %
O2 Saturation: 98 %
PH ART: 7.461 — AB (ref 7.350–7.450)
TCO2: 29 mmol/L (ref 0–100)
TCO2: 29 mmol/L (ref 0–100)
pCO2 arterial: 38.2 mmHg (ref 35.0–45.0)
pCO2 arterial: 42.3 mmHg (ref 35.0–45.0)
pH, Arterial: 7.422 (ref 7.350–7.450)
pO2, Arterial: 102 mmHg — ABNORMAL HIGH (ref 80.0–100.0)
pO2, Arterial: 55 mmHg — ABNORMAL LOW (ref 80.0–100.0)

## 2013-05-09 LAB — CBC
HCT: 41 % (ref 39.0–52.0)
Hemoglobin: 13.7 g/dL (ref 13.0–17.0)
MCH: 32.2 pg (ref 26.0–34.0)
MCHC: 33.4 g/dL (ref 30.0–36.0)
MCV: 96.5 fL (ref 78.0–100.0)
Platelets: 237 10*3/uL (ref 150–400)
RBC: 4.25 MIL/uL (ref 4.22–5.81)
RDW: 12.9 % (ref 11.5–15.5)
WBC: 5.2 10*3/uL (ref 4.0–10.5)

## 2013-05-09 LAB — GLUCOSE, CAPILLARY
GLUCOSE-CAPILLARY: 106 mg/dL — AB (ref 70–99)
GLUCOSE-CAPILLARY: 122 mg/dL — AB (ref 70–99)
GLUCOSE-CAPILLARY: 126 mg/dL — AB (ref 70–99)
GLUCOSE-CAPILLARY: 129 mg/dL — AB (ref 70–99)
Glucose-Capillary: 99 mg/dL (ref 70–99)
Glucose-Capillary: 87 mg/dL (ref 70–99)

## 2013-05-09 LAB — BASIC METABOLIC PANEL
BUN: 17 mg/dL (ref 6–23)
CO2: 26 mEq/L (ref 19–32)
Calcium: 7.9 mg/dL — ABNORMAL LOW (ref 8.4–10.5)
Chloride: 108 mEq/L (ref 96–112)
Creatinine, Ser: 0.56 mg/dL (ref 0.50–1.35)
GFR calc Af Amer: >90 mL/min (ref >90)
GLUCOSE: 134 mg/dL — AB (ref 70–99)
POTASSIUM: 3.3 meq/L — AB (ref 3.7–5.3)
Sodium: 142 mEq/L (ref 137–147)

## 2013-05-09 LAB — MAGNESIUM: Magnesium: 2.7 mg/dL — ABNORMAL HIGH (ref 1.5–2.5)

## 2013-05-09 LAB — PHOSPHORUS: Phosphorus: 2.7 mg/dL (ref 2.3–4.6)

## 2013-05-09 MED ORDER — PROPOFOL 10 MG/ML IV EMUL
0.0000 ug/kg/min | INTRAVENOUS | Status: DC
  Administered 2013-05-09: 30 ug/kg/min via INTRAVENOUS
  Filled 2013-05-09: qty 100

## 2013-05-09 MED ORDER — DEXMEDETOMIDINE HCL IN NACL 200 MCG/50ML IV SOLN: 0.4000 ug/kg/h | INTRAVENOUS | Status: DC

## 2013-05-09 MED ORDER — POTASSIUM CHLORIDE 20 MEQ/15ML (10%) PO LIQD
40.0000 meq | ORAL | Status: AC
  Administered 2013-05-09 (×2): 40 meq via ORAL
  Filled 2013-05-09 (×2): qty 30

## 2013-05-09 MED ORDER — DEXMEDETOMIDINE BOLUS VIA INFUSION
1.0000 ug/kg | Freq: Once | INTRAVENOUS | Status: AC
  Administered 2013-05-09: 80 ug via INTRAVENOUS
  Filled 2013-05-09: qty 80

## 2013-05-09 MED ORDER — DEXMEDETOMIDINE HCL IN NACL 400 MCG/100ML IV SOLN
0.4000 ug/kg/h | INTRAVENOUS | Status: DC
  Administered 2013-05-09: 0.4 ug/kg/h via INTRAVENOUS
  Administered 2013-05-09: 0.9 ug/kg/h via INTRAVENOUS
  Administered 2013-05-09: 0.8 ug/kg/h via INTRAVENOUS
  Administered 2013-05-09: 0.5 ug/kg/h via INTRAVENOUS
  Administered 2013-05-10: 1.2 ug/kg/h via INTRAVENOUS
  Administered 2013-05-10: 1 ug/kg/h via INTRAVENOUS
  Administered 2013-05-10: 1.2 ug/kg/h via INTRAVENOUS
  Administered 2013-05-10: 1.1 ug/kg/h via INTRAVENOUS
  Administered 2013-05-10 (×4): 1.2 ug/kg/h via INTRAVENOUS
  Filled 2013-05-09 (×12): qty 100
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
## 2013-05-09 NOTE — Procedures (Signed)
Extubation Procedure Note  Patient Details:   Name: Monish Haliburton DOB: [birth year] MRN: [id number]   Airway Documentation:  Airway 8 mm (Active)  Secured at (cm) 29 cm 05/09/2013  9:27 AM  Measured From Lips 05/09/2013  9:27 AM  Secured Location Right 05/09/2013  9:27 AM  Secured By [employer] 05/09/2013  9:27 AM  Tube Holder Repositioned Yes 05/09/2013  9:27 AM  Cuff Pressure (cm H2O) 28 cm H2O 05/07/2013  7:37 AM  Site Condition Dry 05/09/2013  3:00 AM    Evaluation  O2 sats: stable throughout Complications: No apparent complications Patient did tolerate procedure well. Bilateral Breath Sounds: Clear Suctioning: Airway Yes Pt extubated per MD, Pt placed on 4lpm [place], Able to vocalize  Cordella Register 05/09/2013, 10:28 AM

## 2013-05-09 NOTE — Progress Notes (Signed)
Fentanyl 175 ml wasted from 250 cc bag, witnessed with Carnella [place]

## 2013-05-09 NOTE — Progress Notes (Signed)
eLink Physician-Brief Progress Note Patient Name: Mark Rich DOB: 1970-08-25 MRN: [id number]  Date of Service  05/09/2013   HPI/Events of Note  Hypokalemia   eICU Interventions  Potassium replaced   Intervention Category Intermediate Interventions: Electrolyte abnormality - evaluation and management  Raymonde Hamblin 05/09/2013, 6:37 AM

## 2013-05-09 NOTE — Progress Notes (Signed)
Aggitated, pulled out NG tube
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
## 2013-05-10 ENCOUNTER — Inpatient Hospital Stay (HOSPITAL_COMMUNITY): Payer: Self-pay

## 2013-05-10 DIAGNOSIS — J9600 Acute respiratory failure, unspecified whether with hypoxia or hypercapnia: Secondary | ICD-10-CM

## 2013-05-10 LAB — GLUCOSE, CAPILLARY
GLUCOSE-CAPILLARY: 95 mg/dL (ref 70–99)
GLUCOSE-CAPILLARY: 97 mg/dL (ref 70–99)
Glucose-Capillary: 97 mg/dL (ref 70–99)
Glucose-Capillary: 102 mg/dL — ABNORMAL HIGH (ref 70–99)
Glucose-Capillary: 104 mg/dL — ABNORMAL HIGH (ref 70–99)
Glucose-Capillary: 89 mg/dL (ref 70–99)

## 2013-05-10 LAB — CBC
HCT: 48.1 % (ref 39.0–52.0)
HEMOGLOBIN: 16.5 g/dL (ref 13.0–17.0)
MCH: 33.1 pg (ref 26.0–34.0)
MCHC: 34.3 g/dL (ref 30.0–36.0)
MCV: 96.4 fL (ref 78.0–100.0)
PLATELETS: 265 10*3/uL (ref 150–400)
RBC: 4.99 MIL/uL (ref 4.22–5.81)
RDW: 12.7 % (ref 11.5–15.5)
WBC: 6.4 10*3/uL (ref 4.0–10.5)

## 2013-05-10 LAB — BASIC METABOLIC PANEL
BUN: 16 mg/dL (ref 6–23)
CHLORIDE: 103 meq/L (ref 96–112)
CO2: 29 meq/L (ref 19–32)
Calcium: 8.3 mg/dL — ABNORMAL LOW (ref 8.4–10.5)
Creatinine, Ser: 0.81 mg/dL (ref 0.50–1.35)
GFR calc Af Amer: >90 mL/min (ref >90)
GFR calc non Af Amer: >90 mL/min (ref >90)
GLUCOSE: 103 mg/dL — AB (ref 70–99)
Potassium: 3.8 mEq/L (ref 3.7–5.3)
SODIUM: 142 meq/L (ref 137–147)

## 2013-05-10 LAB — BLOOD GAS, ARTERIAL
Acid-Base Excess: 1.1 mmol/L (ref 0.0–2.0)
BICARBONATE: 24.6 meq/L — AB (ref 20.0–24.0)
Drawn by: 331001
FIO2: 0.4 %
MECHVT: 690 mL
O2 Saturation: 97.3 %
PEEP/CPAP: 5 cmH2O
PO2 ART: 86.9 mmHg (ref 80.0–100.0)
Patient temperature: 98.4
RATE: 18 resp/min
TCO2: 25.7 mmol/L (ref 0–100)
pCO2 arterial: 34.9 mmHg — ABNORMAL LOW (ref 35.0–45.0)
pH, Arterial: 7.461 — ABNORMAL HIGH (ref 7.350–7.450)

## 2013-05-10 LAB — PHOSPHORUS: Phosphorus: 2.5 mg/dL (ref 2.3–4.6)

## 2013-05-10 LAB — MAGNESIUM: MAGNESIUM: 2.5 mg/dL (ref 1.5–2.5)

## 2013-05-10 MED ORDER — CHLORHEXIDINE GLUCONATE 0.12 % MT SOLN: 15.0000 mL | Freq: Two times a day (BID) | OROMUCOSAL | Status: DC

## 2013-05-10 MED ORDER — BIOTENE DRY MOUTH MT LIQD: 15.0000 mL | Freq: Four times a day (QID) | OROMUCOSAL | Status: DC

## 2013-05-10 MED ORDER — HALOPERIDOL LACTATE 5 MG/ML IJ SOLN
5.0000 mg | INTRAMUSCULAR | Status: DC | PRN
  Filled 2013-05-10: qty 1

## 2013-05-10 MED ORDER — PROPOFOL 10 MG/ML IV EMUL
INTRAVENOUS | Status: AC
  Filled 2013-05-10: qty 100

## 2013-05-10 MED ORDER — ALBUTEROL SULFATE (2.5 MG/3ML) 0.083% IN NEBU: 2.5000 mg | INHALATION_SOLUTION | RESPIRATORY_TRACT | Status: DC | PRN

## 2013-05-10 MED ORDER — PROPOFOL 10 MG/ML IV EMUL
0.0000 ug/kg/min | INTRAVENOUS | Status: DC
  Administered 2013-05-10 (×2): 30 ug/kg/min via INTRAVENOUS
  Administered 2013-05-11: 40 ug/kg/min via INTRAVENOUS
  Administered 2013-05-11: 35 ug/kg/min via INTRAVENOUS
  Administered 2013-05-11: 30 ug/kg/min via INTRAVENOUS
  Administered 2013-05-11: 35 ug/kg/min via INTRAVENOUS
  Administered 2013-05-11: 40 ug/kg/min via INTRAVENOUS
  Administered 2013-05-11: 35 ug/kg/min via INTRAVENOUS
  Administered 2013-05-11: 30 ug/kg/min via INTRAVENOUS
  Administered 2013-05-11: 35 ug/kg/min via INTRAVENOUS
  Administered 2013-05-12 (×2): 45 ug/kg/min via INTRAVENOUS
  Administered 2013-05-12: 50 ug/kg/min via INTRAVENOUS
  Administered 2013-05-12: 45 ug/kg/min via INTRAVENOUS
  Administered 2013-05-12 (×2): 50 ug/kg/min via INTRAVENOUS
  Administered 2013-05-12: 45 ug/kg/min via INTRAVENOUS
  Administered 2013-05-12 – 2013-05-13 (×7): 50 ug/kg/min via INTRAVENOUS
  Filled 2013-05-10 (×12): qty 100
  Filled 2013-05-10: qty 200
  Filled 2013-05-10: qty 100
  Filled 2013-05-10: qty 200
  Filled 2013-05-10 (×7): qty 100

## 2013-05-10 MED ORDER — HALOPERIDOL LACTATE 5 MG/ML IJ SOLN
5.0000 mg | Freq: Three times a day (TID) | INTRAMUSCULAR | Status: DC
  Administered 2013-05-10 (×2): 5 mg via INTRAVENOUS
  Filled 2013-05-10 (×4): qty 1

## 2013-05-10 MED ORDER — LORAZEPAM 2 MG/ML IJ SOLN
2.0000 mg | INTRAMUSCULAR | Status: DC
Start: 2013-05-10 — End: 2013-05-11
  Administered 2013-05-10 – 2013-05-11 (×5): 2 mg via INTRAVENOUS
  Filled 2013-05-10 (×6): qty 1

## 2013-05-10 NOTE — Procedures (Signed)
Intubation Procedure Note Shep Porter [id number] [birth year]  Procedure: Intubation Indications: Airway protection and maintenance  Procedure Details Consent: Unable to obtain consent because of altered level of consciousness. Time Out: Verified patient identification, verified procedure, site/side was marked, verified correct patient position, special equipment/implants available, medications/allergies/relevent history reviewed, required imaging and test results available.  Performed  Medications used: fentanyl, propofol and succinylcholine Equipment used: Glidescope # 4 Grade I View Correct placement confirmed by by auscultation, by CXR and ETCO2 monitor Tube secured at 25 cm at the lip  Evaluation Hemodynamic Status: BP stable throughout; O2 sats: stable throughout Patient's Current Condition: stable Complications: No apparent complications Patient did tolerate procedure well. Chest X-ray ordered to verify placement.  CXR: pending.   Waynetta Pean, M.D. Pulmonary and Critical Care Medicine Call [place] with questions [phone] 05/10/2013

## 2013-05-10 NOTE — Progress Notes (Signed)
Name: Mark Rich MRN: [id number] DOB: 05-14-1970    ADMISSION DATE:  05/05/2013 CONSULTATION DATE:  2/25  REFERRING MD :  Berneice Heinrich NP PRIMARY SERVICE: [facility]ad>>>PCCM   CHIEF COMPLAINT:  ETOH withdrawal   BRIEF PATIENT DESCRIPTION:  This is a 43 year old male w/ heavy ETOH hx (12beers/d, last drink 2/2), HTN and untreated OSA. Admitted on 2/23 w/  peritonsillar abscess (CT evaluated by ENT who advised medical rx). Shortly following admission developed progressive agitation and confusion. PCCM asked to eval given escalating sedation requirements and episodes of desaturation,   SIGNIFICANT EVENTS / STUDIES:  2/23 CT soft tissue neck: Asymmetric enlargement of the right great left palatine tonsils, with edema on the right neck, and probable right peritonsillar abscess. Right greater than left neck jugulodigastric lymph node is  likely reactive 2/23 repeat CT soft tissue neck: 9 mm focal fluid within the midline submucosal nasopharynx, unclear  whether this reflects Thornwaldt cyst, fluid trapped within prominent soft tissues or small abscess. 2/25: moved to ICU in DTs 2/26: remains agitated on top of propofol and fentanyl  LINES / TUBES: ETT 2/25 >>2/27  CULTURES: Group A strep culture 2/23: POSITIVE   ANTIBIOTICS: unasyn 2/23>>>  SUBJECTIVE:  Increased agitation over night - 18 mg ativan + precedex afebrile   VITAL SIGNS: Temp:  [95 F (35 C)-98.3 F (36.8 C)] 98.1 F (36.7 C) (02/28 0727) Pulse Rate:  [55-111] 72 (02/28 0600) Resp:  [16-28] 25 (02/28 0600) BP: (108-183)/(59-111) 126/92 mmHg (02/28 0600) SpO2:  [91 %-100 %] 98 % (02/28 0600) FiO2 (%):  [40 %] 40 % (02/27 0936) Weight:  [153 kg (337 lb 4.9 oz)] 153 kg (337 lb 4.9 oz) (02/28 0500) HEMODYNAMICS:   VENTILATOR SETTINGS: Vent Mode:  [-] CPAP;PSV FiO2 (%):  [40 %] 40 % Set Rate:  [18 bmp] 18 bmp Vt Set:  [580 mL] 580 mL PEEP:  [5 cmH20] 5 cmH20 Pressure Support:  [5 cmH20] 5 cmH20 Plateau Pressure:   [27 cmH20] 27 cmH20 INTAKE / OUTPUT: Intake/Output     02/27 0701 - 02/28 0700 02/28 0701 - 03/01 0700   I.V. (mL/kg) 2290.7 (15)    Other 10    NG/GT 20    IV Piggyback 600    Total Intake(mL/kg) 2920.7 (19.1)    Urine (mL/kg/hr) 1895 (0.5)    Total Output 1895     Net +1025.7            PHYSICAL EXAMINATION: General:  obese male, sedated Neuro:  Agitated on precedex gtt HEENT:  NCAT, intubated Cardiovascular:  rrr Lungs:  Bl air entry + Abdomen:  + bowel sounds, soft  Musculoskeletal:  Intact  Skin:  Intact   LABS:  CBC  Recent Labs Lab 05/08/13 0218 05/09/13 0245 05/10/13 0300  WBC 8.2 5.2 6.4  HGB 13.9 13.7 16.5  HCT 41.5 41.0 48.1  PLT 289 237 265   Coag's No results found for this basename: APTT, INR,  in the last 168 hours BMET  Recent Labs Lab 05/08/13 0218 05/09/13 0245 05/10/13 0300  NA 141 142 142  K 3.5* 3.3* 3.8  CL 103 108 103  CO2 28 26 29   BUN 18 17 16   CREATININE 0.66 0.56 0.81  GLUCOSE 138* 134* 103*   Electrolytes  Recent Labs Lab 05/08/13 0218 05/08/13 2154 05/09/13 0245 05/09/13 1400 05/10/13 0300  CALCIUM 8.4  --  7.9*  --  8.3*  MG 2.9* 3.0*  --  2.7* 2.5  PHOS 3.1  2.5  --  2.7 2.5   Sepsis Markers No results found for this basename: LATICACIDVEN, PROCALCITON, O2SATVEN,  in the last 168 hours ABG  Recent Labs Lab 05/07/13 0500 05/09/13 0342 05/09/13 1438  PHART 7.306* 7.461* 7.422  PCO2ART 59.8* 38.2 42.3  PO2ART 236.0* 102.0* 55.0*   Liver Enzymes  Recent Labs Lab 05/07/13 0625  AST 28  ALT 43  ALKPHOS 59  BILITOT 0.5  ALBUMIN 3.3*   Cardiac Enzymes No results found for this basename: TROPONINI, PROBNP,  in the last 168 hours Glucose  Recent Labs Lab 05/09/13 1138 05/09/13 1649 05/09/13 1954 05/10/13 0006 05/10/13 0425 05/10/13 0710  GLUCAP 99 106* 87 97 95 97    Imaging Dg Chest Port 1 View  05/09/2013   CLINICAL DATA:  Intubation  EXAM: PORTABLE CHEST - 1 VIEW  COMPARISON:   05/08/2013  FINDINGS: Endotracheal tube remains in good position. NG tube enters the stomach  Heart size is enlarged. There is improved aeration with improved lung volume and decreased atelectasis in the bases. No definite edema.  IMPRESSION: Improved aeration of the lung bases compared with yesterday. Mild bibasilar atelectasis remains.   Electronically Signed   By: Franchot Gallo M.D.   On: 05/09/2013 07:40     CXR: no infiltrate   ASSESSMENT / PLAN:  NEUROLOGIC A:   Delirium tremens  P:   ct precedex max 1.2 Fent IV prn IV thiamine and folate  Scheduled ativan IV, prn IV for breakthrough agitation - if becomes too sedated will back off of this   PULMONARY A: Acute hypercarbic resp failure -resolved OSA  >periods of desaturation  P:  CPAP when more co-operative   CARDIOVASCULAR A:  HTN in setting of w/d Tachycardia in setting of w/d P:  PRN b-blocker D/c clonidine patch  IVFs   RENAL A:  Apparent hematuria - related to trauma Hypokalemia P:   IV hydration  Foley cath  Monitor urine output Repleted K  GASTROINTESTINAL A:  No acute  P:   Npo    HEMATOLOGIC A:  No acute  P:  LMWH, SCDs Trend cbc   INFECTIOUS A:  peritonsillar abscess  P:   Not surgical per ENT  Ct unasyn  ENDOCRINE A:  Hyperglycemia  P:   Cbgs, start ssi if >150    TODAY'S SUMMARY:  Extubated, precedex & ativan for agitation Add haldol for breakthrough If unable to control may need reintubation   Care during the described time interval was provided by me and/or other providers on the critical care team.  I have reviewed this patient's available data, including medical history, events of note, physical examination and test results as part of my evaluation  CC time x 35 m  Kara Mead MD. [facility].  Pulmonary & Critical care Pager [phone] If no response call [phone]   05/10/2013 8:01 AM
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
## 2013-05-10 NOTE — Progress Notes (Signed)
Patient ID: Mark Rich, male   DOB: January 04, 1971, 43 y.o.   MRN: [id number] He is still sedated and was just given Ativan for combatitveness. He did let me examine his mouth and he has no trismus. The uvula is long but not inflammed. The tonsils are not inflammed or asymmetric. They are both about +2. He has the OSA looking pharyngeal anatomy.  I discussed case with Dr. Dagmar Hait and we  will continue the Abx and perform a CT scan if he cannot give [place] a subjective assessment about his pharyngeal soreness in the next few days.

## 2013-05-10 NOTE — Progress Notes (Signed)
Dr Blake Divine in [place] made aware of patients frequent pvc's after receiving haldol doses today. 12 lead ekg obtained and md aware. Patient less agitated this afternoon , but still not following commands. Need for restraints cont due to patients attemting to get out of bed and combative to staff. Will continue to monitor.Marland KitchenMarland KitchenMarland Kitchen

## 2013-05-11 LAB — BASIC METABOLIC PANEL
BUN: 13 mg/dL (ref 6–23)
CHLORIDE: 105 meq/L (ref 96–112)
CO2: 26 meq/L (ref 19–32)
CREATININE: 0.74 mg/dL (ref 0.50–1.35)
Calcium: 8.3 mg/dL — ABNORMAL LOW (ref 8.4–10.5)
GFR calc Af Amer: >90 mL/min (ref >90)
GFR calc non Af Amer: >90 mL/min (ref >90)
GLUCOSE: 77 mg/dL (ref 70–99)
Potassium: 3.7 mEq/L (ref 3.7–5.3)
Sodium: 145 mEq/L (ref 137–147)

## 2013-05-11 LAB — GLUCOSE, CAPILLARY
GLUCOSE-CAPILLARY: 63 mg/dL — AB (ref 70–99)
GLUCOSE-CAPILLARY: 71 mg/dL (ref 70–99)
Glucose-Capillary: 78 mg/dL (ref 70–99)
Glucose-Capillary: 78 mg/dL (ref 70–99)
Glucose-Capillary: 85 mg/dL (ref 70–99)
Glucose-Capillary: 86 mg/dL (ref 70–99)
Glucose-Capillary: 88 mg/dL (ref 70–99)

## 2013-05-11 LAB — CBC
HCT: 45.5 % (ref 39.0–52.0)
Hemoglobin: 15.9 g/dL (ref 13.0–17.0)
MCH: 33.1 pg (ref 26.0–34.0)
MCHC: 34.9 g/dL (ref 30.0–36.0)
MCV: 94.8 fL (ref 78.0–100.0)
Platelets: 290 10*3/uL (ref 150–400)
RBC: 4.8 MIL/uL (ref 4.22–5.81)
RDW: 12.7 % (ref 11.5–15.5)
WBC: 7.3 10*3/uL (ref 4.0–10.5)

## 2013-05-11 LAB — TRIGLYCERIDES: Triglycerides: 293 mg/dL — ABNORMAL HIGH (ref <150)

## 2013-05-11 MED ORDER — DEXTROSE 50 % IV SOLN
INTRAVENOUS | Status: AC
  Administered 2013-05-11: 50 mL
  Filled 2013-05-11: qty 50

## 2013-05-11 MED ORDER — LORAZEPAM 2 MG/ML IJ SOLN
2.0000 mg | Freq: Four times a day (QID) | INTRAMUSCULAR | Status: DC
  Administered 2013-05-11 – 2013-05-12 (×4): 2 mg via INTRAVENOUS
  Filled 2013-05-11 (×4): qty 1

## 2013-05-11 MED ORDER — VITAL HIGH PROTEIN PO LIQD
1000.0000 mL | ORAL | Status: DC
  Administered 2013-05-11 (×2): 1000 mL
  Filled 2013-05-11 (×3): qty 1000

## 2013-05-11 NOTE — Progress Notes (Signed)
PULMONARY / CRITICAL CARE MEDICINE   Name: Mark Rich MRN: [id number] DOB: 02-02-1971    ADMISSION DATE:  05/05/2013 CONSULTATION DATE:  05/07/2013  REFERRING MD :  [facility] PRIMARY SERVICE: PCCM  CHIEF COMPLAINT:  Alcohol withdrawal   BRIEF PATIENT DESCRIPTION:  43 yo with history of obesity, alcohol abuse and untreated OSA admitted on 2/23 with peritonsillar abscess (CT evaluated by ENT who advised medical management ). Developed alcoholic delirium, moved to ICU, intubated.  SIGNIFICANT EVENTS / STUDIES:  2/23  CT neck >>> Asymmetric enlargement of the right great left palatine tonsils, with edema on the right neck, and probable right peritonsillar abscess. Right greater than left neck jugulodigastric lymph node is likely reactive 2/23  CT neck >>> Focal fluid within the midline submucosal nasopharynx 2/25  Moved to ICU  2/28  Re intubated for severe delirium and inability to protect airway  LINES / TUBES: OETT 2/25 >>> 2/27; 2/28 >>> 2/28 OGT >>>> 2/25 Foley >>>  CULTURES: 2/23  Throat >>> GROUP A STREP  ANTIBIOTICS: Unasyn 2/23 >>> 3/2 Ampicillin 3/2 >>>  INTERVAL HISTORY:  No acute overnight events.  VITAL SIGNS: Temp:  [98 F (36.7 C)-99.5 F (37.5 C)] 98.5 F (36.9 C) (03/02 0735) Pulse Rate:  [72-105] 81 (03/02 0600) Resp:  [14-22] 14 (03/02 0600) BP: (93-142)/(48-82) 115/74 mmHg (03/02 0819) SpO2:  [100 %] 100 % (03/02 0600) FiO2 (%):  [30 %] 30 % (03/02 0819) Weight:  [153.2 kg (337 lb 11.9 oz)] 153.2 kg (337 lb 11.9 oz) (03/02 0500)  HEMODYNAMICS:   VENTILATOR SETTINGS: Vent Mode:  [-] CPAP;PSV FiO2 (%):  [30 %] 30 % Set Rate:  [14 bmp] 14 bmp Vt Set:  [690 mL] 690 mL PEEP:  [5 cmH20] 5 cmH20 Pressure Support:  [12 cmH20] 12 cmH20 Plateau Pressure:  [20 cmH20-21 cmH20] 20 cmH20  INTAKE / OUTPUT: Intake/Output     03/01 0701 - 03/02 0700 03/02 0701 - 03/03 0700   I.V. (mL/kg) 2019.2 (13.2)    NG/GT 750    IV Piggyback 100    Total Intake(mL/kg)  2869.2 (18.7)    Urine (mL/kg/hr) 2150 (0.6)    Stool 1 (0)    Total Output 2151     Net +718.2            PHYSICAL EXAMINATION: General:  Appears acutely ill, mechanically ventilated, synchronous Neuro:  Encephalopathic, nonfocal, cough / gag diminished HEENT:  PERRL, OETT / OGT Cardiovascular:  RRR, no m/r/g Lungs:  Bilateral diminished air entry, no w/r/r Abdomen:  Soft, nontender, bowel sounds diminished Musculoskeletal:  Moves all extremities, trace edema Skin:  Intact  LABS:  CBC  Recent Labs Lab 05/10/13 0300 05/11/13 0255 05/12/13 0717  WBC 6.4 7.3 6.7  HGB 16.5 15.9 17.1*  HCT 48.1 45.5 50.4  PLT 265 290 261   Coag's No results found for this basename: APTT, INR,  in the last 168 hours  BMET  Recent Labs Lab 05/10/13 0300 05/11/13 0255 05/12/13 0717  NA 142 145 138  K 3.8 3.7 3.5*  CL 103 105 103  CO2 29 26 19   BUN 16 13 11   CREATININE 0.81 0.74 0.75  GLUCOSE 103* 77 99   Electrolytes  Recent Labs Lab 05/08/13 2154  05/09/13 1400 05/10/13 0300 05/11/13 0255 05/12/13 0717  CALCIUM  --   < >  --  8.3* 8.3* 8.3*  MG 3.0*  --  2.7* 2.5  --   --   PHOS 2.5  --  2.7 2.5  --   --   < > = values in this interval not displayed. Sepsis Markers No results found for this basename: LATICACIDVEN, PROCALCITON, O2SATVEN,  in the last 168 hours  ABG  Recent Labs Lab 05/09/13 0342 05/09/13 1438 05/10/13 2229  PHART 7.461* 7.422 7.461*  PCO2ART 38.2 42.3 34.9*  PO2ART 102.0* 55.0* 86.9   Liver Enzymes  Recent Labs Lab 05/07/13 0625  AST 28  ALT 43  ALKPHOS 59  BILITOT 0.5  ALBUMIN 3.3*   Cardiac Enzymes No results found for this basename: TROPONINI, PROBNP,  in the last 168 hours Glucose  Recent Labs Lab 05/11/13 1110 05/11/13 1512 05/11/13 1927 05/11/13 2359 05/12/13 0342 05/12/13 0738  GLUCAP 88 86 85 90 97 84    IMAGING:   Dg Chest Port 1 View  05/12/2013   CLINICAL DATA:  Check endotracheal tube position.  EXAM:  PORTABLE CHEST - 1 VIEW  COMPARISON:  05/10/2013  FINDINGS: Endotracheal tube ends 2 cm above the carina. An orogastric tube enters the stomach.  Unchanged cardiomegaly. There is worsening aeration at the left base, with the diaphragm more indistinct. Lung volumes remain low. No evidence of edema, increasing pleural fluid, or pneumothorax.  IMPRESSION: 1. Endotracheal tube ends 2 cm above the carina. 2. Hypoaeration with atelectasis or focal infiltrate at the left base.   Electronically Signed   By: Jorje Guild M.D.   On: 05/12/2013 07:06   Portable Chest Xray  05/10/2013   CLINICAL DATA:  Endotracheal tube placement.  EXAM: PORTABLE CHEST - 1 VIEW  COMPARISON:  05/09/2013  FINDINGS: The endotracheal tube is present with tip approximately 3 cm above the carina. Nasogastric tube has been advanced slightly with tip over the stomach in the left upper quadrant. Lungs are hypoinflated with mild prominence of the perihilar markings suggesting minimal vascular congestion. There is mild stable cardiomegaly. Remainder of the exam is unchanged.  IMPRESSION: Mild cardiomegaly with normal prominence of the perihilar markings suggesting mild vascular congestion.  Tubes and lines as described.   Electronically Signed   By: Marin Olp M.D.   On: 05/10/2013 21:48   ASSESSMENT / PLAN:  PULMONARY A:  Acute respiratory failure in setting of acute encephalopathy Untreated OSA P: Goal pH>7.30, SpO2>92 Continuous mechanical support VAP bundle Daily SBT Trend ABG/CXR D/c Albuterol  CARDIOVASCULAR A:  HTN in setting of alcohol withdrawal P:  Labetalol PRN  RENAL A:    Hematuria, resolved Hypokalemia P:   Trend BMP K 40 x 2  GASTROINTESTINAL A:   Nutrition GI Px  P:   TF Protonix  HEMATOLOGIC A:   VTE Px P:  Trend CBC Lovenox  INFECTIOUS A:   Peritonsillar abscess  P:   ENT Janace Hoard ) following, no intervention required Repeat neck CT Narrow abx as above   ENDOCRINE A:    Hyperglycemia P:   SSI  NEUROLOGIC A:   Alcohol withdrawal Acute encephalopathy P:   Propofol gtt Fentanyl PRN Thiamine and folate  D/c Haldol / Ativan  I have personally obtained history, examined patient, evaluated and interpreted laboratory and imaging results, reviewed medical records, formulated assessment / plan and placed orders.  CRITICAL CARE:  The patient is critically ill with multiple organ systems failure and requires high complexity decision making for assessment and support, frequent evaluation and titration of therapies, application of advanced monitoring technologies and extensive interpretation of multiple databases. Critical Care Time devoted to patient care services described in this note is 37  minutes.   Doree Fudge, MD Pulmonary and [place] Pager: [phone]  05/12/2013, 9:00 AM

## 2013-05-11 NOTE — Progress Notes (Signed)
Patient ID: Mark Rich, male   DOB: 27-Jan-1971, 43 y.o.   MRN: [id number] Patient reintubated from sedation and withdraw. Pharyngeal exam not possible. Discussed situation with Dr Dagmar Hait and he said there was no report on reintubation of any excessive swelling. I still think a repeat Ct scan would be helpful to be sure there is no worsening of the PTA but clinically there is no evidence of that.

## 2013-05-11 NOTE — Progress Notes (Signed)
Name: Mark Rich MRN: [id number] DOB: 01/13/1971    ADMISSION DATE:  05/05/2013 CONSULTATION DATE:  2/25  REFERRING MD :  Berneice Heinrich NP PRIMARY SERVICE: [facility]>>>PCCM   CHIEF COMPLAINT:  ETOH withdrawal   BRIEF PATIENT DESCRIPTION:  This is a 43 year old male w/ heavy ETOH hx (12beers/d, last drink 2/2), HTN and untreated OSA. Admitted on 2/23 w/  peritonsillar abscess (CT evaluated by ENT who advised medical rx). Shortly following admission developed progressive agitation and confusion. PCCM asked to eval given escalating sedation requirements and episodes of desaturation,   SIGNIFICANT EVENTS / STUDIES:  2/23 CT soft tissue neck: Asymmetric enlargement of the right great left palatine tonsils, with edema on the right neck, and probable right peritonsillar abscess. Right greater than left neck jugulodigastric lymph node is  likely reactive 2/23 repeat CT soft tissue neck: 9 mm focal fluid within the midline submucosal nasopharynx, unclear  whether this reflects Thornwaldt cyst, fluid trapped within prominent soft tissues or small abscess. 2/25: moved to ICU in DTs 2/26: remains agitated on top of propofol and fentanyl 2/27 ENT eval post extubation - no tonsillar swelling  LINES / TUBES: ETT 2/25 >>2/27          2/28 >>  CULTURES: Group A strep culture 2/23: POSITIVE   ANTIBIOTICS: unasyn 2/23>>>  SUBJECTIVE:  reintubated ovenight due to inability to manage secretions Afebrile Good UO   VITAL SIGNS: Temp:  [97.6 F (36.4 C)-98.4 F (36.9 C)] 97.9 F (36.6 C) (03/01 0733) Pulse Rate:  [25-101] 101 (03/01 0722) Resp:  [14-28] 20 (03/01 0722) BP: (103-151)/(47-120) 151/120 mmHg (03/01 0722) SpO2:  [93 %-100 %] 100 % (03/01 0722) FiO2 (%):  [40 %] 40 % (03/01 0722) Weight:  [153.3 kg (337 lb 15.4 oz)] 153.3 kg (337 lb 15.4 oz) (03/01 0400) HEMODYNAMICS:   VENTILATOR SETTINGS: Vent Mode:  [-] PRVC FiO2 (%):  [40 %] 40 % Set Rate:  [14 bmp-18 bmp] 14 bmp Vt Set:   [690 mL] 690 mL PEEP:  [5 cmH20] 5 cmH20 Plateau Pressure:  [20 cmH20-22 cmH20] 21 cmH20 INTAKE / OUTPUT: Intake/Output     02/28 0701 - 03/01 0700 03/01 0701 - 03/02 0700   I.V. (mL/kg) 1334.6 (8.7)    Other     NG/GT     IV Piggyback 300    Total Intake(mL/kg) 1634.6 (10.7)    Urine (mL/kg/hr) 2800 (0.8)    Total Output 2800     Net -1165.4            PHYSICAL EXAMINATION: General:  obese male, sedated Neuro:  Sedated on propofol gtt HEENT:  NCAT, intubated Cardiovascular:  rrr Lungs:  Bl air entry + Abdomen:  + bowel sounds, soft  Musculoskeletal:  Intact  Skin:  Intact   LABS:  CBC  Recent Labs Lab 05/09/13 0245 05/10/13 0300 05/11/13 0255  WBC 5.2 6.4 7.3  HGB 13.7 16.5 15.9  HCT 41.0 48.1 45.5  PLT 237 265 290   Coag's No results found for this basename: APTT, INR,  in the last 168 hours BMET  Recent Labs Lab 05/09/13 0245 05/10/13 0300 05/11/13 0255  NA 142 142 145  K 3.3* 3.8 3.7  CL 108 103 105  CO2 26 29 26   BUN 17 16 13   CREATININE 0.56 0.81 0.74  GLUCOSE 134* 103* 77   Electrolytes  Recent Labs Lab 05/08/13 2154 05/09/13 0245 05/09/13 1400 05/10/13 0300 05/11/13 0255  CALCIUM  --  7.9*  --  8.3* 8.3*  MG 3.0*  --  2.7* 2.5  --   PHOS 2.5  --  2.7 2.5  --    Sepsis Markers No results found for this basename: LATICACIDVEN, PROCALCITON, O2SATVEN,  in the last 168 hours ABG  Recent Labs Lab 05/09/13 0342 05/09/13 1438 05/10/13 2229  PHART 7.461* 7.422 7.461*  PCO2ART 38.2 42.3 34.9*  PO2ART 102.0* 55.0* 86.9   Liver Enzymes  Recent Labs Lab 05/07/13 0625  AST 28  ALT 43  ALKPHOS 59  BILITOT 0.5  ALBUMIN 3.3*   Cardiac Enzymes No results found for this basename: TROPONINI, PROBNP,  in the last 168 hours Glucose  Recent Labs Lab 05/10/13 1509 05/10/13 1936 05/10/13 2355 05/11/13 0346 05/11/13 0428 05/11/13 0714  GLUCAP 104* 89 78 63* 78 71    Imaging Portable Chest Xray  05/10/2013   CLINICAL DATA:   Endotracheal tube placement.  EXAM: PORTABLE CHEST - 1 VIEW  COMPARISON:  05/09/2013  FINDINGS: The endotracheal tube is present with tip approximately 3 cm above the carina. Nasogastric tube has been advanced slightly with tip over the stomach in the left upper quadrant. Lungs are hypoinflated with mild prominence of the perihilar markings suggesting minimal vascular congestion. There is mild stable cardiomegaly. Remainder of the exam is unchanged.  IMPRESSION: Mild cardiomegaly with normal prominence of the perihilar markings suggesting mild vascular congestion.  Tubes and lines as described.   Electronically Signed   By: Marin Olp M.D.   On: 05/10/2013 21:48     CXR: no infiltrate   ASSESSMENT / PLAN:  NEUROLOGIC A:   Delirium tremens  P:   ct propofol Fent IV prn IV thiamine and folate  Scheduled ativan IV, prn IV for breakthrough agitation - if becomes too sedated will back off of this Dc haldol   PULMONARY A: Acute hypercarbic resp failure -reintubated 2/28 OSA  >periods of desaturation  P: SBTs but Would wait  & ensure delerium resolved before extubating again   CARDIOVASCULAR A:  HTN in setting of w/d Tachycardia-resolved P:  PRN b-blocker D/c clonidine patch  IVFs  Monitor TGs -rising on propofol  RENAL A:  Apparent hematuria - related to trauma Hypokalemia P:   IV hydration  Foley cath  Monitor urine output Repleted K  GASTROINTESTINAL A:  No acute  P:   Restart TFs   HEMATOLOGIC A:  No acute  P:  LMWH, SCDs   INFECTIOUS A:  peritonsillar abscess  P:   Not surgical per ENT (byers) Rpt CT on 3/2 (not ordered) Ct unasyn  ENDOCRINE A:  Hyperglycemia  P:   Cbgs, start ssi if >150    TODAY'S SUMMARY:  Reintubated related to ongoing DTs, re-image tonsils & ensure DTs resolved before extubating again   Care during the described time interval was provided by me and/or other providers on the critical care team.  I have reviewed this  patient's available data, including medical history, events of note, physical examination and test results as part of my evaluation  CC time x 35 m  Kara Mead MD. [facility]. [place] [facility] Pager [phone] If no response call [phone]   05/11/2013 8:34 AM

## 2013-05-11 NOTE — Progress Notes (Signed)
[facility] ADULT ICU REPLACEMENT PROTOCOL FOR AM LAB REPLACEMENT ONLY  The patient does not apply for the [facility] Adult ICU Electrolyte Replacment Protocol based on the criteria listed below:   1. Is GFR >/= 40 ml/min? yes  Patient's GFR today is >90 2. Is urine output >/= 0.5 ml/kg/hr for the last 6 hours? no Patient's UOP is 0.2 ml/kg/hr 3. Is BUN < 60 mg/dL? yes  Patient's BUN today is 13 4. Abnormal electrolyte(s): K 3.7 5. Ordered repletion with: NA 6. If a panic level lab has been reported, has the CCM MD in charge been notified? yes.   Physician:  Guttenberg 05/11/2013 5:59 AM

## 2013-05-12 ENCOUNTER — Encounter (HOSPITAL_COMMUNITY): Payer: Self-pay | Admitting: *Deleted

## 2013-05-12 ENCOUNTER — Inpatient Hospital Stay (HOSPITAL_COMMUNITY): Payer: Managed Care, Other (non HMO)

## 2013-05-12 ENCOUNTER — Inpatient Hospital Stay (HOSPITAL_COMMUNITY): Payer: Self-pay

## 2013-05-12 LAB — CBC
HEMATOCRIT: 50.4 % (ref 39.0–52.0)
HEMOGLOBIN: 17.1 g/dL — AB (ref 13.0–17.0)
MCH: 32.3 pg (ref 26.0–34.0)
MCHC: 33.9 g/dL (ref 30.0–36.0)
MCV: 95.1 fL (ref 78.0–100.0)
Platelets: 261 10*3/uL (ref 150–400)
RBC: 5.3 MIL/uL (ref 4.22–5.81)
RDW: 12.8 % (ref 11.5–15.5)
WBC: 6.7 10*3/uL (ref 4.0–10.5)

## 2013-05-12 LAB — BASIC METABOLIC PANEL
BUN: 11 mg/dL (ref 6–23)
CHLORIDE: 103 meq/L (ref 96–112)
CO2: 19 mEq/L (ref 19–32)
CREATININE: 0.75 mg/dL (ref 0.50–1.35)
Calcium: 8.3 mg/dL — ABNORMAL LOW (ref 8.4–10.5)
GFR calc Af Amer: >90 mL/min (ref >90)
GFR calc non Af Amer: >90 mL/min (ref >90)
Glucose, Bld: 99 mg/dL (ref 70–99)
POTASSIUM: 3.5 meq/L — AB (ref 3.7–5.3)
Sodium: 138 mEq/L (ref 137–147)

## 2013-05-12 LAB — GLUCOSE, CAPILLARY
GLUCOSE-CAPILLARY: 100 mg/dL — AB (ref 70–99)
Glucose-Capillary: 111 mg/dL — ABNORMAL HIGH (ref 70–99)
Glucose-Capillary: 73 mg/dL (ref 70–99)
Glucose-Capillary: 84 mg/dL (ref 70–99)
Glucose-Capillary: 90 mg/dL (ref 70–99)
Glucose-Capillary: 97 mg/dL (ref 70–99)

## 2013-05-12 MED ORDER — PRO-STAT SUGAR FREE PO LIQD
60.0000 mL | Freq: Every day | ORAL | Status: DC
  Administered 2013-05-12 – 2013-05-13 (×6): 60 mL
  Filled 2013-05-12 (×9): qty 60

## 2013-05-12 MED ORDER — IOHEXOL 300 MG/ML  SOLN
75.0000 mL | Freq: Once | INTRAMUSCULAR | Status: AC | PRN
  Administered 2013-05-12: 75 mL via INTRAVENOUS

## 2013-05-12 MED ORDER — VITAL HIGH PROTEIN PO LIQD
1000.0000 mL | ORAL | Status: DC
  Administered 2013-05-12: 1000 mL
  Filled 2013-05-12 (×2): qty 1000

## 2013-05-12 MED ORDER — SODIUM CHLORIDE 0.9 % IV SOLN
2.0000 g | Freq: Four times a day (QID) | INTRAVENOUS | Status: DC
  Administered 2013-05-12 – 2013-05-15 (×12): 2 g via INTRAVENOUS
  Filled 2013-05-12 (×19): qty 2000

## 2013-05-12 MED ORDER — POTASSIUM CHLORIDE 20 MEQ/15ML (10%) PO LIQD
40.0000 meq | ORAL | Status: AC
  Administered 2013-05-12 (×2): 40 meq
  Filled 2013-05-12 (×3): qty 30

## 2013-05-12 NOTE — Progress Notes (Signed)
NUTRITION FOLLOW UP  Intervention:   Utilize 77M PEPuP Protocol:  Re-initiate TF via OG tube with Vital High Protein at 15 ml/hr and Prostat 60 ml five times per day on day 1; on day 2, goal rate remains 20 ml/hr (480 ml per day) to provide 1360 kcal, 182 gm protein (105% of needs), 300 ml free water daily.  Propofol and TF regimen provides: 2450 kcal (26 kcal/kg of IBW) MVI daily   Nutrition Dx:   Inadequate oral intake related to inability to eat as evidenced by NPO status; ongoing  Goal:   Enteral nutrition to provide 60-70% of estimated calorie needs (22-25 kcals/kg ideal body weight) and 100% of estimated protein needs, based on ASPEN guidelines for permissive underfeeding in critically ill obese individuals; currently unmet  Monitor:   Vent status, TF tolerance, weight trend, labs   Assessment:   Pt admitted who has a hx of heavy ETOH hx (12 beers per day, last drink 2/2). Patient admitted with delirium tremens. Pt failed precedex and is on propofol at a high rate contributing a significant amount of kcal per day from lipid.   Patient is currently intubated on ventilator support.  MV: 10.7 L/min Temp (24hrs), Avg:98.6 F (37 C), Min:98 F (36.7 C), Max:99.5 F (37.5 C)  Propofol: 41.3 ml/hr providing 1090 kcal per day from lipid  Pt re intubated 2/28 for severe delirium and inability to protect airway . RD re-consulted 3/1 for tube feeding management. Pt remains on propofol at high rate. Last BM per nursing notes, 2/22. Vital HP ordered @ 40 ml/hr but, TF not running at time of visit. Per order details, TF held for CT of abdomen.    Height: Ht Readings from Last 1 Encounters:  05/05/13 6\' 4"  (1.93 m)    Weight Status:   Wt Readings from Last 1 Encounters:  05/12/13 337 lb 11.9 oz (153.2 kg)    Re-estimated needs:  Kcal: 2814 (Goal 1688-1970 kcal daily) Protein: >/= 183 grams Fluid: > 2 L/day  Skin: intact  Diet Order: NPO   Intake/Output Summary (Last 24  hours) at 05/12/13 1007 Last data filed at 05/12/13 0900  Gross per 24 hour  Intake 2595.4 ml  Output   2476 ml  Net  119.4 ml    Last BM: 2/22   Labs:   Recent Labs Lab 05/08/13 2154  05/09/13 1400 05/10/13 0300 05/11/13 0255 05/12/13 0717  NA  --   < >  --  142 145 138  K  --   < >  --  3.8 3.7 3.5*  CL  --   < >  --  103 105 103  CO2  --   < >  --  29 26 19   BUN  --   < >  --  16 13 11   CREATININE  --   < >  --  0.81 0.74 0.75  CALCIUM  --   < >  --  8.3* 8.3* 8.3*  MG 3.0*  --  2.7* 2.5  --   --   PHOS 2.5  --  2.7 2.5  --   --   GLUCOSE  --   < >  --  103* 77 99  < > = values in this interval not displayed.  CBG (last 3)   Recent Labs  05/11/13 2359 05/12/13 0342 05/12/13 0738  GLUCAP 90 97 84    Scheduled Meds: . ampicillin (OMNIPEN) IV  2 g Intravenous 4 times per  day  . antiseptic oral rinse  15 mL Mouth Rinse QID  . bisacodyl  10 mg Oral Daily  . chlorhexidine  15 mL Mouth Rinse BID  . enoxaparin (LOVENOX) injection  75 mg Subcutaneous Q24H  . feeding supplement (VITAL HIGH PROTEIN)  1,000 mL Per Tube Q24H  . folic acid  1 mg Per Tube Daily  . insulin aspart  2-6 Units Subcutaneous 6 times per day  . multivitamin  5 mL Per Tube Daily  . potassium chloride  40 mEq Per Tube Q4H  . sodium chloride  3 mL Intravenous Q12H  . thiamine  100 mg Intravenous Daily    Continuous Infusions: . dextrose 5 % and 0.45% NaCl 50 mL/hr at 05/11/13 2000  . propofol 45 mcg/kg/min (05/12/13 0947)    Pryor Ochoa RD, LDN Inpatient Clinical Dietitian Pager: [phone] After Hours Pager: [phone]

## 2013-05-12 NOTE — Progress Notes (Signed)
UR Completed.  Mark Rich .tele 05/12/2013

## 2013-05-12 NOTE — Progress Notes (Signed)
Notification of Study Enrollment  This patient has been enrolled in the IRB-approved STOPPIT study, looking at avoiding PPI's when enteral feeds reach at least 30cc/hr.  Please do not place on acid suppression therapy for prophylaxis prior to contacting study team.  Mark Rich. Jacqlyn Larsen, PharmD Clinical Pharmacist - Resident Pager: [phone] Pharmacy: [phone] 05/12/2013 9:34 AM
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
## 2013-05-12 NOTE — Progress Notes (Signed)
RT Note- Placed back to vent, increased RR.

## 2013-05-13 LAB — CBC
HEMATOCRIT: 41.5 % (ref 39.0–52.0)
Hemoglobin: 14.3 g/dL (ref 13.0–17.0)
MCH: 32.6 pg (ref 26.0–34.0)
MCHC: 34.5 g/dL (ref 30.0–36.0)
MCV: 94.7 fL (ref 78.0–100.0)
Platelets: 250 10*3/uL (ref 150–400)
RBC: 4.38 MIL/uL (ref 4.22–5.81)
RDW: 13 % (ref 11.5–15.5)
WBC: 4.5 10*3/uL (ref 4.0–10.5)

## 2013-05-13 LAB — GLUCOSE, CAPILLARY
GLUCOSE-CAPILLARY: 99 mg/dL (ref 70–99)
GLUCOSE-CAPILLARY: 91 mg/dL (ref 70–99)
Glucose-Capillary: 106 mg/dL — ABNORMAL HIGH (ref 70–99)
Glucose-Capillary: 114 mg/dL — ABNORMAL HIGH (ref 70–99)
Glucose-Capillary: 126 mg/dL — ABNORMAL HIGH (ref 70–99)
Glucose-Capillary: 128 mg/dL — ABNORMAL HIGH (ref 70–99)

## 2013-05-13 LAB — BASIC METABOLIC PANEL
BUN: 12 mg/dL (ref 6–23)
CO2: 25 meq/L (ref 19–32)
CREATININE: 0.78 mg/dL (ref 0.50–1.35)
Calcium: 8.3 mg/dL — ABNORMAL LOW (ref 8.4–10.5)
Chloride: 107 mEq/L (ref 96–112)
GFR calc Af Amer: >90 mL/min (ref >90)
GFR calc non Af Amer: >90 mL/min (ref >90)
Glucose, Bld: 107 mg/dL — ABNORMAL HIGH (ref 70–99)
Potassium: 4.1 mEq/L (ref 3.7–5.3)
Sodium: 142 mEq/L (ref 137–147)

## 2013-05-13 MED ORDER — VITAL HIGH PROTEIN PO LIQD
1000.0000 mL | ORAL | Status: DC
  Administered 2013-05-13: 1000 mL
  Filled 2013-05-13 (×2): qty 1000

## 2013-05-13 MED ORDER — DEXMEDETOMIDINE HCL IN NACL 200 MCG/50ML IV SOLN
0.4000 ug/kg/h | INTRAVENOUS | Status: DC
  Administered 2013-05-13: 0.9 ug/kg/h via INTRAVENOUS
  Administered 2013-05-13: 0.4 ug/kg/h via INTRAVENOUS
  Administered 2013-05-13: 0.5 ug/kg/h via INTRAVENOUS
  Administered 2013-05-13: 1.1 ug/kg/h via INTRAVENOUS
  Filled 2013-05-13: qty 50
  Filled 2013-05-13 (×2): qty 100

## 2013-05-13 MED ORDER — BIOTENE DRY MOUTH MT LIQD: 15.0000 mL | Freq: Two times a day (BID) | OROMUCOSAL | Status: DC

## 2013-05-13 MED ORDER — SODIUM CHLORIDE 0.9 % IJ SOLN
10.0000 mL | Freq: Two times a day (BID) | INTRAMUSCULAR | Status: DC
  Administered 2013-05-14 (×2): 10 mL

## 2013-05-13 MED ORDER — SODIUM CHLORIDE 0.9 % IJ SOLN
10.0000 mL | INTRAMUSCULAR | Status: DC | PRN
  Administered 2013-05-15: 10 mL

## 2013-05-13 MED ORDER — VITAL HIGH PROTEIN PO LIQD
1000.0000 mL | ORAL | Status: DC
  Filled 2013-05-13: qty 1000

## 2013-05-13 NOTE — Procedures (Signed)
Extubation Procedure Note  Patient Details:   Name: Mark Rich DOB: January 12, 1971 MRN: [id number]   Airway Documentation:     Evaluation  O2 sats: stable throughout Complications: No apparent complications Patient did tolerate procedure well. Bilateral Breath Sounds: Clear;Diminished Suctioning: Airway;Oral Yes Cuff leak positive Placed on 3l/in [place]  Revonda Standard 05/13/2013, 4:33 PM

## 2013-05-13 NOTE — Progress Notes (Signed)
NUTRITION FOLLOW UP  Intervention:   Utilize 8M PEPuP Protocol Decrease TF rate due to high rate of propofol: Continue TF via OG tube with Vital High Protein at 10 ml/hr and Prostat 60 ml five times per day on day 1; on day 2, goal rate remains 10 ml/hr (240 ml per day) to provide 1240 kcal, 171 gm protein (93% of needs), 200 ml free water daily.  Propofol and TF regimen provides: 2452 kcal (27 kcal/kg of IBW) Liquid MVI daily   Nutrition Dx:   Inadequate oral intake related to inability to eat as evidenced by NPO status; ongoing  Goal:   Enteral nutrition to provide 60-70% of estimated calorie needs (22-25 kcals/kg ideal body weight) and 100% of estimated protein needs, based on ASPEN guidelines for permissive underfeeding in critically ill obese individuals; currently met  Monitor:   Vent status, TF tolerance, weight trend, labs   Assessment:   Pt admitted who has a hx of heavy ETOH hx (12 beers per day, last drink 2/2). Patient admitted with delirium tremens. Pt failed precedex and is on propofol at a high rate contributing a significant amount of kcal per day from lipid.   Patient is currently intubated on ventilator support.  MV: 17.5 L/min Temp (24hrs), Avg:98.4 F (36.9 C), Min:97.7 F (36.5 C), Max:99 F (37.2 C)  Propofol: 45.9 ml/hr providing 1212 kcal per day from lipid  Pt re intubated 2/28 for severe delirium and inability to protect airway. RD re-consulted 3/1 for tube feeding management. Pt remains on propofol at high rate. Pt had a large BM 3/2. Vital High Protein running @ 15 ml/hr.  Pt seems to be tolerating TF regimen- last 3 residuals documented were 0 ml.    Height: Ht Readings from Last 1 Encounters:  05/05/13 _0  (1.93 m)    Weight Status:   Wt Readings from Last 1 Encounters:  05/13/13 337 lb 15.4 oz (153.3 kg)  Usual body weight 309 lbs (03/29/12)  Re-estimated needs:  Kcal: 2977 (Goal 1786-2084 kcal daily) Protein: >/= 183 grams Fluid: > 2  L/day  Skin: intact  Diet Order: NPO   Intake/Output Summary (Last 24 hours) at 05/13/13 1003 Last data filed at 05/13/13 0700  Gross per 24 hour  Intake 2591.3 ml  Output   1650 ml  Net  941.3 ml    Last BM: 3/2   Labs:   Recent Labs Lab 05/08/13 2154  05/09/13 1400 05/10/13 0300 05/11/13 0255 05/12/13 0717 05/13/13 0235  NA  --   < >  --  142 145 138 142  K  --   < >  --  3.8 3.7 3.5* 4.1  CL  --   < >  --  103 105 103 107  CO2  --   < >  --  _1 BUN  --   < >  --  _2 CREATININE  --   < >  --  0.81 0.74 0.75 0.78  CALCIUM  --   < >  --  8.3* 8.3* 8.3* 8.3*  MG 3.0*  --  2.7* 2.5  --   --   --   PHOS 2.5  --  2.7 2.5  --   --   --   GLUCOSE  --   < >  --  103* 77 99 107*  < > = values in this interval not displayed.  CBG (last 3)   Recent Labs  05/13/13 0004 05/13/13 0359 05/13/13 0754  GLUCAP 106* 99 91    Scheduled Meds: . ampicillin (OMNIPEN) IV  2 g Intravenous 4 times per day  . antiseptic oral rinse  15 mL Mouth Rinse QID  . bisacodyl  10 mg Oral Daily  . chlorhexidine  15 mL Mouth Rinse BID  . enoxaparin (LOVENOX) injection  75 mg Subcutaneous Q24H  . feeding supplement (PRO-STAT SUGAR FREE 64)  60 mL Per Tube 5 X Daily  . feeding supplement (VITAL HIGH PROTEIN)  1,000 mL Per Tube Q24H  . folic acid  1 mg Per Tube Daily  . insulin aspart  2-6 Units Subcutaneous 6 times per day  . multivitamin  5 mL Per Tube Daily  . sodium chloride  3 mL Intravenous Q12H  . thiamine  100 mg Intravenous Daily    Continuous Infusions: . dextrose 5 % and 0.45% NaCl 50 mL/hr at 05/12/13 1900  . propofol 50 mcg/kg/min (05/13/13 0859)    Pryor Ochoa RD, LDN Inpatient Clinical Dietitian Pager: [phone] After Hours Pager: [phone]

## 2013-05-13 NOTE — Progress Notes (Signed)
PULMONARY / CRITICAL CARE MEDICINE  Name: Mark Rich MRN: [id number] DOB: 24-Sep-1970    ADMISSION DATE:  05/05/2013 CONSULTATION DATE:  05/07/2013  REFERRING MD :  [facility] PRIMARY SERVICE: PCCM  CHIEF COMPLAINT:  Alcohol withdrawal   BRIEF PATIENT DESCRIPTION:  43 yo with history of obesity, alcohol abuse and untreated OSA admitted on 2/23 with peritonsillar abscess (CT evaluated by ENT who advised medical management ). Developed alcoholic delirium, moved to ICU, intubated.  SIGNIFICANT EVENTS / STUDIES:  2/23  CT neck >>> Asymmetric enlargement of the right great left palatine tonsils, with edema on the right neck, and probable right peritonsillar abscess. Right greater than left neck jugulodigastric lymph node is likely reactive 2/23  CT neck >>> Focal fluid within the midline submucosal nasopharynx 2/25  Moved to ICU  2/28  Re intubated for severe delirium and inability to protect airway 3/2   CT neck >>> Improved edema  LINES / TUBES: OETT 2/25 >>> 2/27; 2/28 >>> OGT 2/28 >>>> Foley 2/25 >>> PICC 3/3 >>>  CULTURES: 2/23  Throat >>> GROUP A STREP  ANTIBIOTICS: Unasyn 2/23 >>> 3/2 Ampicillin 3/2 >>>  INTERVAL HISTORY:  No acute overnight events.  VITAL SIGNS: Temp:  [97.7 F (36.5 C)-99 F (37.2 C)] 98.4 F (36.9 C) (03/03 0810) Pulse Rate:  [65-95] 95 (03/03 1000) Resp:  [12-27] 18 (03/03 1000) BP: (96-139)/(43-81) 139/81 mmHg (03/03 1000) SpO2:  [96 %-100 %] 100 % (03/03 1000) FiO2 (%):  [30 %] 30 % (03/03 1006) Weight:  [153.3 kg (337 lb 15.4 oz)] 153.3 kg (337 lb 15.4 oz) (03/03 0500)  HEMODYNAMICS:   VENTILATOR SETTINGS: Vent Mode:  [-] PRVC FiO2 (%):  [30 %] 30 % Set Rate:  [14 bmp] 14 bmp Vt Set:  [690 mL] 690 mL PEEP:  [5 cmH20] 5 cmH20 Pressure Support:  [10 ION62-95 cmH20] 10 cmH20 Plateau Pressure:  [19 cmH20-20 cmH20] 20 cmH20  INTAKE / OUTPUT: Intake/Output     03/02 0701 - 03/03 0700 03/03 0701 - 03/04 0700   I.V. (mL/kg) 2154.7 (14.1)  287.7 (1.9)   NG/GT 649.3 45   IV Piggyback 250    Total Intake(mL/kg) 3054.1 (19.9) 332.7 (2.2)   Urine (mL/kg/hr) 2225 (0.6) 1075 (1.7)   Stool     Total Output 2225 1075   Net +829.1 -742.3        Stool Occurrence 1 x 1 x     PHYSICAL EXAMINATION: General:  Appears acutely ill, mechanically ventilated, synchronous Neuro:  Heavily sedated, nonfocal, cough / gag diminished HEENT:  PERRL, OETT / OGT Cardiovascular:  RRR, no m/r/g Lungs:  Bilateral diminished air entry, no w/r/r Abdomen:  Soft, nontender, bowel sounds diminished Musculoskeletal:  Trace edema Skin:  Intact  LABS:  CBC  Recent Labs Lab 05/11/13 0255 05/12/13 0717 05/13/13 0235  WBC 7.3 6.7 4.5  HGB 15.9 17.1* 14.3  HCT 45.5 50.4 41.5  PLT 290 261 250   Coag's No results found for this basename: APTT, INR,  in the last 168 hours  BMET  Recent Labs Lab 05/11/13 0255 05/12/13 0717 05/13/13 0235  NA 145 138 142  K 3.7 3.5* 4.1  CL 105 103 107  CO2 26 19 25   BUN 13 11 12   CREATININE 0.74 0.75 0.78  GLUCOSE 77 99 107*   Electrolytes  Recent Labs Lab 05/08/13 2154  05/09/13 1400 05/10/13 0300 05/11/13 0255 05/12/13 0717 05/13/13 0235  CALCIUM  --   < >  --  8.3* 8.3* 8.3*  8.3*  MG 3.0*  --  2.7* 2.5  --   --   --   PHOS 2.5  --  2.7 2.5  --   --   --   < > = values in this interval not displayed. Sepsis Markers No results found for this basename: LATICACIDVEN, PROCALCITON, O2SATVEN,  in the last 168 hours  ABG  Recent Labs Lab 05/09/13 0342 05/09/13 1438 05/10/13 2229  PHART 7.461* 7.422 7.461*  PCO2ART 38.2 42.3 34.9*  PO2ART 102.0* 55.0* 86.9   Liver Enzymes  Recent Labs Lab 05/07/13 0625  AST 28  ALT 43  ALKPHOS 59  BILITOT 0.5  ALBUMIN 3.3*   Cardiac Enzymes No results found for this basename: TROPONINI, PROBNP,  in the last 168 hours  Glucose  Recent Labs Lab 05/12/13 1306 05/12/13 1525 05/12/13 1858 05/13/13 0004 05/13/13 0359 05/13/13 0754  GLUCAP  111* 73 100* 106* 99 91   IMAGING:   Ct Soft Tissue Neck W Contrast  05/12/2013   CLINICAL DATA:  43 year old male with tonsillar infection. Sepsis, intubated. Subsequent encounter.  EXAM: CT NECK WITH CONTRAST  TECHNIQUE: Multidetector CT imaging of the neck was performed using the standard protocol following the bolus administration of intravenous contrast.  CONTRAST:  31mL OMNIPAQUE IOHEXOL 300 MG/ML  SOLN  COMPARISON:  05/05/2013.  FINDINGS: Large body habitus. Intubated. Respiratory motion artifact in the lung apices, with evidence of atelectasis. Grossly Stable, negative superior mediastinum. Enteric tube in place.  Negative thyroid. Decreased retropharyngeal effusion. Negative larynx in the setting of intubation. Regressed cervical lymphadenopathy, residual greater on the right. There is some inflammation of the medial right carotid space (series 2, image 77), but the cervical carotids remain patent. Other major vascular structures in the neck and at the skullbase appear patent.  Bilateral indistinct tonsillar and adenoid hypertrophy. Negative parapharyngeal spaces. No abscess or drainable fluid collection identified. Negative sublingual space.  Submandibular and parotid glands remain within normal limits. Visualized orbit soft tissues are within normal limits. Increased paranasal sinus fluid and opacification, primarily the maxillary sinuses. Right mastoid effusion persists.  Grossly stable and negative visualized brain parenchyma.  No acute osseous abnormality identified.  IMPRESSION: 1. Overall improved, with regressed cervical lymphadenopathy and retropharyngeal effusion. 2. Residual diffuse and indistinct tonsillar and adenoid hypertrophy. No discrete abscess or drainable fluid collection identified. 3. Increased paranasal sinus inflammatory changes in the setting of intubation. Stable right mastoid effusion.   Electronically Signed   By: Lars Pinks M.D.   On: 05/12/2013 12:18   Dg Chest Port 1  View  05/12/2013   CLINICAL DATA:  Check endotracheal tube position.  EXAM: PORTABLE CHEST - 1 VIEW  COMPARISON:  05/10/2013  FINDINGS: Endotracheal tube ends 2 cm above the carina. An orogastric tube enters the stomach.  Unchanged cardiomegaly. There is worsening aeration at the left base, with the diaphragm more indistinct. Lung volumes remain low. No evidence of edema, increasing pleural fluid, or pneumothorax.  IMPRESSION: 1. Endotracheal tube ends 2 cm above the carina. 2. Hypoaeration with atelectasis or focal infiltrate at the left base.   Electronically Signed   By: Jorje Guild M.D.   On: 05/12/2013 07:06   ASSESSMENT / PLAN:  PULMONARY A:  Acute respiratory failure in setting of acute encephalopathy Untreated OSA P: Goal pH>7.30, SpO2>92 Continuous mechanical support VAP bundle Daily SBT Trend ABG/CXR Mental status is limiting extubation, will reevaluate after Presidex started  CARDIOVASCULAR A:  HTN in setting of alcohol withdrawal P:  Labetalol  PRN  RENAL A:    Hematuria, resolved P:   Trend BMP  GASTROINTESTINAL A:   Nutrition GI Px  P:   TF Protonix  HEMATOLOGIC A:   VTE Px P:  Trend CBC Lovenox  INFECTIOUS A:   Peritonsillar abscess, repeated imaging reassuring   P:   ENT Janace Hoard ) following, no intervention required Narrow abx as above   ENDOCRINE A:   Hyperglycemia P:   SSI  NEUROLOGIC A:   Alcohol withdrawal Acute encephalopathy P:   D/c Propofol gtt Start Precedex Fentanyl PRN Thiamine and folate  D/c Haldol / Ativan  I have personally obtained history, examined patient, evaluated and interpreted laboratory and imaging results, reviewed medical records, formulated assessment / plan and placed orders.  CRITICAL CARE:  The patient is critically ill with multiple organ systems failure and requires high complexity decision making for assessment and support, frequent evaluation and titration of therapies, application of advanced  monitoring technologies and extensive interpretation of multiple databases. Critical Care Time devoted to patient care services described in this note is 35 minutes.   Doree Fudge, MD Pulmonary and [facility] Pager: [phone]  05/13/2013, 11:12 AM

## 2013-05-13 NOTE — Progress Notes (Signed)
RT Note- Patient has been placed back on wean, sedation changed and tolerating wean well.

## 2013-05-13 NOTE — Progress Notes (Signed)
Peripherally Inserted Central Catheter/Midline Placement  The IV Nurse has discussed with the patient and/or persons authorized to consent for the patient, the purpose of this procedure and the potential benefits and risks involved with this procedure.  The benefits include less needle sticks, lab draws from the catheter and patient may be discharged home with the catheter.  Risks include, but not limited to, infection, bleeding, blood clot (thrombus formation), and puncture of an artery; nerve damage and irregular heat beat.  Alternatives to this procedure were also discussed.  PICC/Midline Placement Documentation        Darlyn Read 05/13/2013, 1:27 PM

## 2013-05-14 LAB — BASIC METABOLIC PANEL
BUN: 11 mg/dL (ref 6–23)
CALCIUM: 8.6 mg/dL (ref 8.4–10.5)
CO2: 26 meq/L (ref 19–32)
CREATININE: 0.6 mg/dL (ref 0.50–1.35)
Chloride: 110 mEq/L (ref 96–112)
Glucose, Bld: 97 mg/dL (ref 70–99)
Potassium: 3.5 mEq/L — ABNORMAL LOW (ref 3.7–5.3)
Sodium: 145 mEq/L (ref 137–147)

## 2013-05-14 LAB — CBC
HCT: 42.6 % (ref 39.0–52.0)
Hemoglobin: 14.4 g/dL (ref 13.0–17.0)
MCH: 32 pg (ref 26.0–34.0)
MCHC: 33.8 g/dL (ref 30.0–36.0)
MCV: 94.7 fL (ref 78.0–100.0)
PLATELETS: 236 10*3/uL (ref 150–400)
RBC: 4.5 MIL/uL (ref 4.22–5.81)
RDW: 12.8 % (ref 11.5–15.5)
WBC: 5.7 10*3/uL (ref 4.0–10.5)

## 2013-05-14 LAB — GLUCOSE, CAPILLARY
Glucose-Capillary: 78 mg/dL (ref 70–99)
Glucose-Capillary: 91 mg/dL (ref 70–99)
Glucose-Capillary: 98 mg/dL (ref 70–99)

## 2013-05-14 MED ORDER — LISINOPRIL 20 MG PO TABS
20.0000 mg | ORAL_TABLET | Freq: Every day | ORAL | Status: DC
  Administered 2013-05-14 – 2013-05-15 (×2): 20 mg via ORAL
  Filled 2013-05-14 (×2): qty 1

## 2013-05-14 MED ORDER — HYDROCHLOROTHIAZIDE 25 MG PO TABS
25.0000 mg | ORAL_TABLET | Freq: Every day | ORAL | Status: DC
  Administered 2013-05-14 – 2013-05-15 (×2): 25 mg via ORAL
  Filled 2013-05-14 (×2): qty 1

## 2013-05-14 MED ORDER — ADULT MULTIVITAMIN W/MINERALS CH
1.0000 | ORAL_TABLET | Freq: Every day | ORAL | Status: DC
  Administered 2013-05-15: 1 via ORAL
  Filled 2013-05-14: qty 1

## 2013-05-14 MED ORDER — POTASSIUM CHLORIDE CRYS ER 20 MEQ PO TBCR
40.0000 meq | EXTENDED_RELEASE_TABLET | Freq: Once | ORAL | Status: AC
  Administered 2013-05-14: 40 meq via ORAL
  Filled 2013-05-14: qty 2

## 2013-05-14 MED ORDER — LISINOPRIL-HYDROCHLOROTHIAZIDE 20-25 MG PO TABS: 1.0000 | ORAL_TABLET | Freq: Every day | ORAL | Status: DC

## 2013-05-14 MED ORDER — VITAMIN B-1 100 MG PO TABS
100.0000 mg | ORAL_TABLET | Freq: Every day | ORAL | Status: DC
  Administered 2013-05-14 – 2013-05-15 (×2): 100 mg via ORAL
  Filled 2013-05-14 (×2): qty 1

## 2013-05-14 MED ORDER — CHLORDIAZEPOXIDE HCL 5 MG PO CAPS
10.0000 mg | ORAL_CAPSULE | Freq: Three times a day (TID) | ORAL | Status: DC
  Administered 2013-05-14 – 2013-05-15 (×5): 10 mg via ORAL
  Filled 2013-05-14 (×5): qty 2

## 2013-05-14 NOTE — Progress Notes (Signed)
NUTRITION FOLLOW UP  Intervention:   Provide Multivitamin with minerals daily Encourage general healthful eating   Nutrition Dx:   Inadequate oral intake related to inability to eat as evidenced by NPO status; discontinued  Goal:   Enteral nutrition to provide 60-70% of estimated calorie needs (22-25 kcals/kg ideal body weight) and 100% of estimated protein needs, based on ASPEN guidelines for permissive underfeeding in critically ill obese individuals; discontinued  New Goal: Pt to meet >/= 90% of their estimated nutrition needs   Monitor:   Vent status, TF tolerance, weight trend, labs   Assessment:   Pt admitted who has a hx of heavy ETOH hx (12 beers per day, last drink 2/2). Patient admitted with delirium tremens. Pt failed precedex and is on propofol at a high rate contributing a significant amount of kcal per day from lipid.   3/3: Pt re intubated 2/28 for severe delirium and inability to protect airway. RD re-consulted 3/1 for tube feeding management. Pt remains on propofol at high rate. Pt had a large BM 3/2. Vital High Protein running @ 15 ml/hr.  Pt seems to be tolerating TF regimen- last 3 residuals documented were 0 ml.   3/4: Pt was extubated 3/3 around 1630 hr. Pt's diet was advanced to regular this morning. Pt reports having a good appetite. Denies any swallowing difficulty. Ate most of breakfast but, does not eat pork or oatmeal. Encouraged healthful eating.    Height: Ht Readings from Last 1 Encounters:  05/05/13 6\' 4"  (1.93 m)    Weight Status:   Wt Readings from Last 1 Encounters:  05/13/13 337 lb 15.4 oz (153.3 kg)  Usual body weight 309 lbs (03/29/12)  Re-estimated needs:  Kcal: 2300-2500  Protein: 140-155 grams Fluid: 2.3-2.5 L/day  Skin: intact; generalized edema  Diet Order: General   Intake/Output Summary (Last 24 hours) at 05/14/13 1126 Last data filed at 05/14/13 0708  Gross per 24 hour  Intake 2019.93 ml  Output   1240 ml  Net 779.93 ml     Last BM: 3/3   Labs:   Recent Labs Lab 05/08/13 2154  05/09/13 1400 05/10/13 0300  05/12/13 0717 05/13/13 0235 05/14/13 0445  NA  --   < >  --  142  < > 138 142 145  K  --   < >  --  3.8  < > 3.5* 4.1 3.5*  CL  --   < >  --  103  < > 103 107 110  CO2  --   < >  --  29  < > 19 25 26   BUN  --   < >  --  16  < > 11 12 11   CREATININE  --   < >  --  0.81  < > 0.75 0.78 0.60  CALCIUM  --   < >  --  8.3*  < > 8.3* 8.3* 8.6  MG 3.0*  --  2.7* 2.5  --   --   --   --   PHOS 2.5  --  2.7 2.5  --   --   --   --   GLUCOSE  --   < >  --  103*  < > 99 107* 97  < > = values in this interval not displayed.  CBG (last 3)   Recent Labs  05/13/13 2344 05/14/13 0356 05/14/13 0806  GLUCAP 98 78 91    Scheduled Meds: . ampicillin (OMNIPEN) IV  2  g Intravenous 4 times per day  . bisacodyl  10 mg Oral Daily  . chlordiazePOXIDE  10 mg Oral TID  . enoxaparin (LOVENOX) injection  75 mg Subcutaneous Q24H  . folic acid  1 mg Per Tube Daily  . hydrochlorothiazide  25 mg Oral Daily  . lisinopril  20 mg Oral Daily  . multivitamin  5 mL Per Tube Daily  . potassium chloride  40 mEq Oral Once  . sodium chloride  10-40 mL Intracatheter Q12H  . thiamine  100 mg Intravenous Daily    Continuous Infusions:    Pryor Ochoa RD, LDN Inpatient Clinical Dietitian Pager: [phone] After Hours Pager: [phone]

## 2013-05-14 NOTE — Clinical Social Work Note (Signed)
Pt transferred from 17M to 6N.  17M CSW gave report to 6N CSW, [place], [place].  No further needs at this time; however CSW remains available for assistance.  Nonnie Done, [place] [phone]  Clinical Social Work

## 2013-05-14 NOTE — Progress Notes (Signed)
PULMONARY / CRITICAL CARE MEDICINE  Name: Mark Rich MRN: [id number] DOB: 08-14-1970    ADMISSION DATE:  05/05/2013 CONSULTATION DATE:  05/07/2013  REFERRING MD :  [facility] PRIMARY SERVICE: PCCM  CHIEF COMPLAINT:  Alcohol withdrawal   BRIEF PATIENT DESCRIPTION:  43 yo with history of obesity, alcohol abuse and untreated OSA admitted on 2/23 with peritonsillar abscess (CT evaluated by ENT who advised medical management ). Developed alcoholic delirium, moved to ICU, intubated.  SIGNIFICANT EVENTS / STUDIES:  2/23  CT neck >>> Asymmetric enlargement of the right great left palatine tonsils, with edema on the right neck, and probable right peritonsillar abscess. Right greater than left neck jugulodigastric lymph node is likely reactive 2/23  CT neck >>> Focal fluid within the midline submucosal nasopharynx 2/25  Moved to ICU  2/28  Re intubated for severe delirium and inability to protect airway 3/2   CT neck >>> Improved edema  LINES / TUBES: OETT 2/25 >>> 2/27; 2/28 >>> OGT 2/28 >>>> 3/3 Foley 2/25 >>> 3/4 PICC 3/3 >>>  CULTURES: 2/23  Throat >>> GROUP A STREP  ANTIBIOTICS: Unasyn 2/23 >>> 3/2 Ampicillin 3/2 >>>  INTERVAL HISTORY:  Much improved mentation.  Remains extubated.  VITAL SIGNS: Temp:  [97.6 F (36.4 C)-98.7 F (37.1 C)] 98 F (36.7 C) (03/04 0843) Pulse Rate:  [62-95] 86 (03/04 0525) Resp:  [14-32] 17 (03/04 0525) BP: (110-152)/(58-92) 152/92 mmHg (03/04 0525) SpO2:  [98 %-100 %] 100 % (03/04 0525) FiO2 (%):  [4 %-30 %] 4 % (03/03 2000)  HEMODYNAMICS:   VENTILATOR SETTINGS: Vent Mode:  [-] PSV;CPAP FiO2 (%):  [4 %-30 %] 4 % Set Rate:  [14 bmp] 14 bmp Vt Set:  [690 mL] 690 mL PEEP:  [5 cmH20] 5 cmH20 Pressure Support:  [5 cmH20] 5 cmH20  INTAKE / OUTPUT: Intake/Output     03/03 0701 - 03/04 0700 03/04 0701 - 03/05 0700   P.O. 220    I.V. (mL/kg) 1817.6 (11.9)    NG/GT 165    IV Piggyback 150 50   Total Intake(mL/kg) 2352.6 (15.3) 50 (0.3)   Urine (mL/kg/hr) 2315 (0.6)    Total Output 2315     Net +37.6 +50        Stool Occurrence 8 x      PHYSICAL EXAMINATION: General:  No distress Neuro:  Awake, alert HEENT:  PERRL Cardiovascular:  RRR, no m/r/g Lungs:  Bilateral air entry Abdomen:  Soft, nontender, bowel sounds diminished Musculoskeletal:  Trace edema Skin:  Intact  LABS:  CBC  Recent Labs Lab 05/12/13 0717 05/13/13 0235 05/14/13 0445  WBC 6.7 4.5 5.7  HGB 17.1* 14.3 14.4  HCT 50.4 41.5 42.6  PLT 261 250 236   Coag's No results found for this basename: APTT, INR,  in the last 168 hours  BMET  Recent Labs Lab 05/12/13 0717 05/13/13 0235 05/14/13 0445  NA 138 142 145  K 3.5* 4.1 3.5*  CL 103 107 110  CO2 19 25 26   BUN 11 12 11   CREATININE 0.75 0.78 0.60  GLUCOSE 99 107* 97   Electrolytes  Recent Labs Lab 05/08/13 2154  05/09/13 1400 05/10/13 0300  05/12/13 0717 05/13/13 0235 05/14/13 0445  CALCIUM  --   < >  --  8.3*  < > 8.3* 8.3* 8.6  MG 3.0*  --  2.7* 2.5  --   --   --   --   PHOS 2.5  --  2.7 2.5  --   --   --   --   < > =  values in this interval not displayed. Sepsis Markers No results found for this basename: LATICACIDVEN, PROCALCITON, O2SATVEN,  in the last 168 hours  ABG  Recent Labs Lab 05/09/13 0342 05/09/13 1438 05/10/13 2229  PHART 7.461* 7.422 7.461*  PCO2ART 38.2 42.3 34.9*  PO2ART 102.0* 55.0* 86.9   Liver Enzymes No results found for this basename: AST, ALT, ALKPHOS, BILITOT, ALBUMIN,  in the last 168 hours Cardiac Enzymes No results found for this basename: TROPONINI, PROBNP,  in the last 168 hours  Glucose  Recent Labs Lab 05/13/13 1137 05/13/13 1608 05/13/13 1849 05/13/13 2344 05/14/13 0356 05/14/13 0806  GLUCAP 114* 126* 128* 98 78 91   IMAGING:   Ct Soft Tissue Neck W Contrast  05/12/2013   CLINICAL DATA:  44 year old male with tonsillar infection. Sepsis, intubated. Subsequent encounter.  EXAM: CT NECK WITH CONTRAST  TECHNIQUE:  Multidetector CT imaging of the neck was performed using the standard protocol following the bolus administration of intravenous contrast.  CONTRAST:  71mL OMNIPAQUE IOHEXOL 300 MG/ML  SOLN  COMPARISON:  05/05/2013.  FINDINGS: Large body habitus. Intubated. Respiratory motion artifact in the lung apices, with evidence of atelectasis. Grossly Stable, negative superior mediastinum. Enteric tube in place.  Negative thyroid. Decreased retropharyngeal effusion. Negative larynx in the setting of intubation. Regressed cervical lymphadenopathy, residual greater on the right. There is some inflammation of the medial right carotid space (series 2, image 77), but the cervical carotids remain patent. Other major vascular structures in the neck and at the skullbase appear patent.  Bilateral indistinct tonsillar and adenoid hypertrophy. Negative parapharyngeal spaces. No abscess or drainable fluid collection identified. Negative sublingual space.  Submandibular and parotid glands remain within normal limits. Visualized orbit soft tissues are within normal limits. Increased paranasal sinus fluid and opacification, primarily the maxillary sinuses. Right mastoid effusion persists.  Grossly stable and negative visualized brain parenchyma.  No acute osseous abnormality identified.  IMPRESSION: 1. Overall improved, with regressed cervical lymphadenopathy and retropharyngeal effusion. 2. Residual diffuse and indistinct tonsillar and adenoid hypertrophy. No discrete abscess or drainable fluid collection identified. 3. Increased paranasal sinus inflammatory changes in the setting of intubation. Stable right mastoid effusion.   Electronically Signed   By: Lars Pinks M.D.   On: 05/12/2013 12:18   ASSESSMENT / PLAN:  PULMONARY A:  Acute respiratory failure in setting of acute encephalopathy, resolved Untreated OSA P: Goal SpO2>92 Supplemental oxygen Needs outpatient sleep study  CARDIOVASCULAR A:  HTN P:  Restart preadmission  Lisinopril / HCTZ  RENAL A:    Hematuria, resolved  Hypokalemia P:   Trend BMP K 40 x 1  GASTROINTESTINAL A:   Nutrition GI Px is not indicated P:   Diet D/c Protonix  HEMATOLOGIC A:   VTE Px P:  Trend CBC Lovenox  INFECTIOUS A:   Peritonsillar abscess, repeated imaging reassuring   P:   ENT Janace Hoard ) following, no intervention required Abx as above  ENDOCRINE A:   Normoglycemic P:   D/c SSI  NEUROLOGIC A:   Alcohol withdrawal, much improved Acute encephalopathy P:   D/c Precedex D/c Fentanyl Thiamine and folate  Librium  Transfer to medical floor 3/3.  PCCM will sign off.  TRH to assume care 3/5.  I have personally obtained history, examined patient, evaluated and interpreted laboratory and imaging results, reviewed medical records, formulated assessment / plan and placed orders.  Doree Fudge, MD Pulmonary and [place] Pager: [phone]  05/14/2013, 9:41 AM

## 2013-05-14 NOTE — Clinical Social Work Psychosocial (Signed)
Clinical Social Work Department BRIEF PSYCHOSOCIAL ASSESSMENT 05/14/2013  Patient:  Mark Rich, Mark Rich     Account Number:  [id number]     Admit date:  05/05/2013  Clinical Social Worker:  Wylene Men  Date/Time:  05/14/2013 02:03 PM  Referred by:  Physician  Date Referred:  05/14/2013 Referred for  SNF Placement   Other Referral:   none   Interview type:  Patient Other interview type:   pt mother, Mark Rich was also present during assessment    PSYCHOSOCIAL DATA Living Status:  ALONE Admitted from facility:   Level of care:   Primary support name:  Mark Rich Primary support relationship to patient:  PARENT Degree of support available:   adequate    CURRENT CONCERNS Current Concerns  Substance Abuse   Other Concerns:   ETOH abuse  grief and loss- seperation from wife of 28 years and son age 32    [place] / [place] CSW assessed pt at bedside for possible ETOH abuse.  Pt stated prior to hospital admission he was drinking on a consistent daily basis without stop or fail.  Pt stated that he would drink 6-10 beers and liquor (if he had some) on a daily basis.  Pt felt that his drinking was out of control.  Pt reports being seperated from his wife of 20 years and not being able to get to see his son who is age 53. Pt very tearful when speaking of this.  CSW offered emotional support and suggested counseling resources. Resources for counseling and ETOH abuse was declined by pt.    Pt states that he is not having any withdrawl sx and has not since his hospital admisison.  Pt stated that his mother, Mark Rich is his main support and can get through this with "my mama and Jesus".  CSW encouraged pt to open up and talk about his grief and feelings of loss and loneliness to someone.  Pt is agreeable to begin engaging with others to recover.    Though pt denied resources, pt was very cooperative and appropriate throughout the assessment. Pt was alert and oriented x4.  Pt stated that he  had permanent housing and family support at home who he agreed to begin reaching out to.  Pt denied SI, HI.    Pt is hopeful regaring his future and says he feels that he has been given a second chance at life.  Pt reports that he "is going to take advantage of the second chance and change some things".   Assessment/plan status:  No Further Intervention Required Other assessment/ plan:   SBIRT completed   Information/referral to community resources:   resources were denied    PATIENT'S/FAMILY'S RESPONSE TO PLAN OF CARE: Pt is agreeable to reach out to his support system instead of to alcohol.  Pt is agreeable to contact CSW throughout his hospital stay if he changes his mind regarding resources.  Pt and mother thanked CSW for time and assistance.       Nonnie Done, [place] [phone]  Clinical Social Work

## 2013-05-15 MED ORDER — ADULT MULTIVITAMIN W/MINERALS CH: 1.0000 | ORAL_TABLET | Freq: Every day | ORAL | Status: DC

## 2013-05-15 MED ORDER — THIAMINE HCL 100 MG PO TABS: 100.0000 mg | ORAL_TABLET | Freq: Every day | ORAL | Status: DC

## 2013-05-15 MED ORDER — LISINOPRIL-HYDROCHLOROTHIAZIDE 20-25 MG PO TABS: 1.0000 | ORAL_TABLET | Freq: Every day | ORAL | Status: DC

## 2013-05-15 MED ORDER — AMOXICILLIN-POT CLAVULANATE 875-125 MG PO TABS: 1.0000 | ORAL_TABLET | Freq: Two times a day (BID) | ORAL | Status: DC

## 2013-05-15 MED ORDER — CHLORDIAZEPOXIDE HCL 10 MG PO CAPS: ORAL_CAPSULE | ORAL | Status: DC

## 2013-05-15 MED ORDER — FOLIC ACID 1 MG PO TABS: 1.0000 mg | ORAL_TABLET | Freq: Every day | ORAL | Status: DC
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
## 2013-05-15 NOTE — Care Management Note (Signed)
    Page 1 of 2   05/15/2013     11:21:59 AM   CARE MANAGEMENT NOTE 05/15/2013  Patient:  Mark Rich, Mark Rich   Account Number:  [id number]  Date Initiated:  05/06/2013  Documentation initiated by:  Elissa Hefty  Subjective/Objective Assessment:   adm w peritonsillar abscess     Action/Plan:   lives w fam, pcp dr Jori Moll polite   Anticipated DC Date:  05/14/2013   Anticipated DC Plan:  [place]  CM consult  [facility]  [facility]      Choice offered to / List presented to:             Status of service:  In process, will continue to follow Medicare Important Message given?   (If response is "NO", the following Medicare IM given date fields will be blank) Date Medicare IM given:   Date Additional Medicare IM given:    Discharge Disposition:  HOME/SELF CARE  Per UR Regulation:  Reviewed for med. necessity/level of care/duration of stay  If discussed at [place] of Stay Meetings, dates discussed:   05/13/2013    Comments:  Contact:  Ford,[name] Mother         [phone]  - [place]     [phone]  05-15-13 Medication assistance . CMA Nia researched [place] plan found it termed 03/12/13. Spoke to [place] in admitting who confirmed patinet is self pay. Gave patient Concepc[place] letter and explained , patinet voiced understanding . Also provided patinet with [facility] and [facility] for PCP. Magdalen Spatz RN BSN [phone] .     05-12-13 11:25am Luz Lex, RNBSN [phone] Extubated on 2-27 - but reintubatedon 2-28 - remains sedated and on vent.  05-07-13 [place] [phone] tx in for ETOH withdrawal - intubated and placed on precedex drip.  gave pt [facility] list. pt states may change phy.

## 2013-05-23 ENCOUNTER — Ambulatory Visit: Payer: Managed Care, Other (non HMO) | Attending: Internal Medicine | Admitting: Internal Medicine

## 2013-05-23 ENCOUNTER — Encounter: Payer: Self-pay | Admitting: Internal Medicine

## 2013-05-23 VITALS — BP 123/85 | HR 87 | Temp 98.1°F | Resp 16 | Ht 74.0 in | Wt 329.0 lb

## 2013-05-23 DIAGNOSIS — I10 Essential (primary) hypertension: Secondary | ICD-10-CM | POA: Insufficient documentation

## 2013-05-23 DIAGNOSIS — J36 Peritonsillar abscess: Secondary | ICD-10-CM | POA: Insufficient documentation

## 2013-05-23 DIAGNOSIS — F1010 Alcohol abuse, uncomplicated: Secondary | ICD-10-CM | POA: Insufficient documentation

## 2013-05-23 DIAGNOSIS — E669 Obesity, unspecified: Secondary | ICD-10-CM | POA: Insufficient documentation

## 2013-05-23 MED ORDER — LISINOPRIL-HYDROCHLOROTHIAZIDE 20-25 MG PO TABS: 1.0000 | ORAL_TABLET | Freq: Every day | ORAL | Status: DC
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
## 2013-05-23 NOTE — Progress Notes (Signed)
Pt here to establish care s/o Peritonsillar abscess with completed ABT course Denies CP or sob Afebrile. Need refill bp meds

## 2013-08-25 ENCOUNTER — Ambulatory Visit: Payer: Managed Care, Other (non HMO) | Admitting: Internal Medicine

## 2013-10-13 ENCOUNTER — Encounter: Payer: Self-pay | Admitting: Internal Medicine

## 2013-10-13 ENCOUNTER — Ambulatory Visit: Payer: Self-pay | Attending: Internal Medicine | Admitting: Internal Medicine

## 2013-10-13 VITALS — BP 115/77 | HR 74 | Temp 98.0°F | Resp 16 | Ht 74.5 in | Wt 324.0 lb

## 2013-10-13 DIAGNOSIS — J36 Peritonsillar abscess: Secondary | ICD-10-CM

## 2013-10-13 DIAGNOSIS — F10931 Alcohol use, unspecified with withdrawal delirium: Secondary | ICD-10-CM | POA: Insufficient documentation

## 2013-10-13 DIAGNOSIS — Z885 Allergy status to narcotic agent status: Secondary | ICD-10-CM | POA: Insufficient documentation

## 2013-10-13 DIAGNOSIS — F10231 Alcohol dependence with withdrawal delirium: Secondary | ICD-10-CM | POA: Insufficient documentation

## 2013-10-13 DIAGNOSIS — F1020 Alcohol dependence, uncomplicated: Secondary | ICD-10-CM | POA: Insufficient documentation

## 2013-10-13 DIAGNOSIS — I10 Essential (primary) hypertension: Secondary | ICD-10-CM | POA: Insufficient documentation

## 2013-10-13 DIAGNOSIS — Z7982 Long term (current) use of aspirin: Secondary | ICD-10-CM | POA: Insufficient documentation

## 2013-10-13 LAB — POCT GLYCOSYLATED HEMOGLOBIN (HGB A1C): Hemoglobin A1C: 5.9

## 2013-10-13 MED ORDER — LISINOPRIL-HYDROCHLOROTHIAZIDE 20-25 MG PO TABS: 1.0000 | ORAL_TABLET | Freq: Every day | ORAL | Status: DC

## 2013-10-13 NOTE — Patient Instructions (Signed)
DASH Eating Plan DASH stands for "Dietary Approaches to Stop Hypertension." The DASH eating plan is a healthy eating plan that has been shown to reduce high blood pressure (hypertension). Additional health benefits may include reducing the risk of type 2 diabetes mellitus, heart disease, and stroke. The DASH eating plan may also help with weight loss. WHAT DO I NEED TO KNOW ABOUT THE DASH EATING PLAN? For the DASH eating plan, you will follow these general guidelines:  Choose foods with a percent daily value for sodium of less than 5% (as listed on the food label).  Use salt-free seasonings or herbs instead of table salt or sea salt.  Check with your health care provider or pharmacist before using salt substitutes.  Eat lower-sodium products, often labeled as "lower sodium" or "no salt added."  Eat fresh foods.  Eat more vegetables, fruits, and low-fat dairy products.  Choose whole grains. Look for the word "whole" as the first word in the ingredient list.  Choose fish and skinless chicken or turkey more often than red meat. Limit fish, poultry, and meat to 6 oz (170 g) each day.  Limit sweets, desserts, sugars, and sugary drinks.  Choose heart-healthy fats.  Limit cheese to 1 oz (28 g) per day.  Eat more home-cooked food and less restaurant, buffet, and fast food.  Limit fried foods.  Cook foods using methods other than frying.  Limit canned vegetables. If you do use them, rinse them well to decrease the sodium.  When eating at a restaurant, ask that your food be prepared with less salt, or no salt if possible. WHAT FOODS CAN I EAT? Seek help from a dietitian for individual calorie needs. Grains Whole grain or whole wheat bread. Brown rice. Whole grain or whole wheat pasta. Quinoa, bulgur, and whole grain cereals. Low-sodium cereals. Corn or whole wheat flour tortillas. Whole grain cornbread. Whole grain crackers. Low-sodium crackers. Vegetables Fresh or frozen vegetables  (raw, steamed, roasted, or grilled). Low-sodium or reduced-sodium tomato and vegetable juices. Low-sodium or reduced-sodium tomato sauce and paste. Low-sodium or reduced-sodium canned vegetables.  Fruits All fresh, canned (in natural juice), or frozen fruits. Meat and Other Protein Products Ground beef (85% or leaner), grass-fed beef, or beef trimmed of fat. Skinless chicken or turkey. Ground chicken or turkey. Pork trimmed of fat. All fish and seafood. Eggs. Dried beans, peas, or lentils. Unsalted nuts and seeds. Unsalted canned beans. Dairy Low-fat dairy products, such as skim or 1% milk, 2% or reduced-fat cheeses, low-fat ricotta or cottage cheese, or plain low-fat yogurt. Low-sodium or reduced-sodium cheeses. Fats and Oils Tub margarines without trans fats. Light or reduced-fat mayonnaise and salad dressings (reduced sodium). Avocado. Safflower, olive, or canola oils. Natural peanut or almond butter. Other Unsalted popcorn and pretzels. The items listed above may not be a complete list of recommended foods or beverages. Contact your dietitian for more options. WHAT FOODS ARE NOT RECOMMENDED? Grains White bread. White pasta. White rice. Refined cornbread. Bagels and croissants. Crackers that contain trans fat. Vegetables Creamed or fried vegetables. Vegetables in a cheese sauce. Regular canned vegetables. Regular canned tomato sauce and paste. Regular tomato and vegetable juices. Fruits Dried fruits. Canned fruit in light or heavy syrup. Fruit juice. Meat and Other Protein Products Fatty cuts of meat. Ribs, chicken wings, bacon, sausage, bologna, salami, chitterlings, fatback, hot dogs, bratwurst, and packaged luncheon meats. Salted nuts and seeds. Canned beans with salt. Dairy Whole or 2% milk, cream, half-and-half, and cream cheese. Whole-fat or sweetened yogurt. Full-fat   cheeses or blue cheese. Nondairy creamers and whipped toppings. Processed cheese, cheese spreads, or cheese  curds. Condiments Onion and garlic salt, seasoned salt, table salt, and sea salt. Canned and packaged gravies. Worcestershire sauce. Tartar sauce. Barbecue sauce. Teriyaki sauce. Soy sauce, including reduced sodium. Steak sauce. Fish sauce. Oyster sauce. Cocktail sauce. Horseradish. Ketchup and mustard. Meat flavorings and tenderizers. Bouillon cubes. Hot sauce. Tabasco sauce. Marinades. Taco seasonings. Relishes. Fats and Oils Butter, stick margarine, lard, shortening, ghee, and bacon fat. Coconut, palm kernel, or palm oils. Regular salad dressings. Other Pickles and olives. Salted popcorn and pretzels. The items listed above may not be a complete list of foods and beverages to avoid. Contact your dietitian for more information. WHERE CAN I FIND MORE INFORMATION? National Heart, Lung, and [employer] Institute: www.nhlbi.nih.gov/health/health-topics/topics/dash/ Document Released: 02/16/2011 Document Revised: 07/14/2013 Document Reviewed: 01/01/2013 [employer] Patient Information 2015 [employer]. This information is not intended to replace advice given to you by your health care provider. Make sure you discuss any questions you have with your health care provider. Diabetes Mellitus and Food It is important for you to manage your blood sugar (glucose) level. Your blood glucose level can be greatly affected by what you eat. Eating healthier foods in the appropriate amounts throughout the day at about the same time each day will help you control your blood glucose level. It can also help slow or prevent worsening of your diabetes mellitus. Healthy eating may even help you improve the level of your blood pressure and reach or maintain a healthy weight.  HOW CAN FOOD AFFECT ME? Carbohydrates Carbohydrates affect your blood glucose level more than any other type of food. Your dietitian will help you determine how many carbohydrates to eat at each meal and teach you how to count carbohydrates. Counting  carbohydrates is important to keep your blood glucose at a healthy level, especially if you are using insulin or taking certain medicines for diabetes mellitus. Alcohol Alcohol can cause sudden decreases in blood glucose (hypoglycemia), especially if you use insulin or take certain medicines for diabetes mellitus. Hypoglycemia can be a life-threatening condition. Symptoms of hypoglycemia (sleepiness, dizziness, and disorientation) are similar to symptoms of having too much alcohol.  If your health care provider has given you approval to drink alcohol, do so in moderation and use the following guidelines:  Women should not have more than one drink per day, and men should not have more than two drinks per day. One drink is equal to:  12 oz of beer.  5 oz of wine.  1 oz of hard liquor.  Do not drink on an empty stomach.  Keep yourself hydrated. Have water, diet soda, or unsweetened iced tea.  Regular soda, juice, and other mixers might contain a lot of carbohydrates and should be counted. WHAT FOODS ARE NOT RECOMMENDED? As you make food choices, it is important to remember that all foods are not the same. Some foods have fewer nutrients per serving than other foods, even though they might have the same number of calories or carbohydrates. It is difficult to get your body what it needs when you eat foods with fewer nutrients. Examples of foods that you should avoid that are high in calories and carbohydrates but low in nutrients include:  Trans fats (most processed foods list trans fats on the Nutrition Facts label).  Regular soda.  Juice.  Candy.  Sweets, such as cake, pie, doughnuts, and cookies.  Fried foods. WHAT FOODS CAN I EAT? Have nutrient-rich foods,   which will nourish your body and keep you healthy. The food you should eat also will depend on several factors, including:  The calories you need.  The medicines you take.  Your weight.  Your blood glucose level.  Your  blood pressure level.  Your cholesterol level. You also should eat a variety of foods, including:  Protein, such as meat, poultry, fish, tofu, nuts, and seeds (lean animal proteins are best).  Fruits.  Vegetables.  Dairy products, such as milk, cheese, and yogurt (low fat is best).  Breads, grains, pasta, cereal, rice, and beans.  Fats such as olive oil, trans fat-free margarine, canola oil, avocado, and olives. DOES EVERYONE WITH DIABETES MELLITUS HAVE THE SAME MEAL PLAN? Because every person with diabetes mellitus is different, there is not one meal plan that works for everyone. It is very important that you meet with a dietitian who will help you create a meal plan that is just right for you. Document Released: 11/24/2004 Document Revised: 03/04/2013 Document Reviewed: 01/24/2013 [facility] Patient Information 2015 [place], [place]. This information is not intended to replace advice given to you by your health care provider. Make sure you discuss any questions you have with your health care provider. Hypertension Hypertension, commonly called high blood pressure, is when the force of blood pumping through your arteries is too strong. Your arteries are the blood vessels that carry blood from your heart throughout your body. A blood pressure reading consists of a higher number over a lower number, such as 110/72. The higher number (systolic) is the pressure inside your arteries when your heart pumps. The lower number (diastolic) is the pressure inside your arteries when your heart relaxes. Ideally you want your blood pressure below 120/80. Hypertension forces your heart to work harder to pump blood. Your arteries may become narrow or stiff. Having hypertension puts you at risk for heart disease, stroke, and other problems.  RISK FACTORS Some risk factors for high blood pressure are controllable. Others are not.  Risk factors you cannot control include:   Race. You may be at higher risk if  you are African American.  Age. Risk increases with age.  Gender. Men are at higher risk than women before age 39 years. After age 76, women are at higher risk than men. Risk factors you can control include:  Not getting enough exercise or physical activity.  Being overweight.  Getting too much fat, sugar, calories, or salt in your diet.  Drinking too much alcohol. SIGNS AND SYMPTOMS Hypertension does not usually cause signs or symptoms. Extremely high blood pressure (hypertensive crisis) may cause headache, anxiety, shortness of breath, and nosebleed. DIAGNOSIS  To check if you have hypertension, your health care provider will measure your blood pressure while you are seated, with your arm held at the level of your heart. It should be measured at least twice using the same arm. Certain conditions can cause a difference in blood pressure between your right and left arms. A blood pressure reading that is higher than normal on one occasion does not mean that you need treatment. If one blood pressure reading is high, ask your health care provider about having it checked again. TREATMENT  Treating high blood pressure includes making lifestyle changes and possibly taking medicine. Living a healthy lifestyle can help lower high blood pressure. You may need to change some of your habits. Lifestyle changes may include:  Following the DASH diet. This diet is high in fruits, vegetables, and whole grains. It is low  in salt, red meat, and added sugars.  Getting at least 2 hours of brisk physical activity every week.  Losing weight if necessary.  Not smoking.  Limiting alcoholic beverages.  Learning ways to reduce stress. If lifestyle changes are not enough to get your blood pressure under control, your health care provider may prescribe medicine. You may need to take more than one. Work closely with your health care provider to understand the risks and benefits. HOME CARE INSTRUCTIONS  Have  your blood pressure rechecked as directed by your health care provider.   Take medicines only as directed by your health care provider. Follow the directions carefully. Blood pressure medicines must be taken as prescribed. The medicine does not work as well when you skip doses. Skipping doses also puts you at risk for problems.   Do not smoke.   Monitor your blood pressure at home as directed by your health care provider. SEEK MEDICAL CARE IF:   You think you are having a reaction to medicines taken.  You have recurrent headaches or feel dizzy.  You have swelling in your ankles.  You have trouble with your vision. SEEK IMMEDIATE MEDICAL CARE IF:  You develop a severe headache or confusion.  You have unusual weakness, numbness, or feel faint.  You have severe chest or abdominal pain.  You vomit repeatedly.  You have trouble breathing. MAKE SURE YOU:   Understand these instructions.  Will watch your condition.  Will get help right away if you are not doing well or get worse. Document Released: 02/27/2005 Document Revised: 07/14/2013 Document Reviewed: 12/20/2012 [facility] Patient Information 2015 [place], [place]. This information is not intended to replace advice given to you by your health care provider. Make sure you discuss any questions you have with your health care provider.
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
## 2013-10-13 NOTE — Progress Notes (Signed)
Pt is here following up on his HTN and chronic lower back pain.

## 2013-10-14 LAB — LIPID PANEL
CHOL/HDL RATIO: 5.3 ratio
CHOLESTEROL: 144 mg/dL (ref 0–200)
HDL: 27 mg/dL — AB (ref >39)
LDL Cholesterol: 89 mg/dL (ref 0–99)
Triglycerides: 138 mg/dL (ref <150)
VLDL: 28 mg/dL (ref 0–40)

## 2013-10-20 ENCOUNTER — Telehealth: Payer: Self-pay | Admitting: Emergency Medicine

## 2013-10-20 ENCOUNTER — Telehealth: Payer: Self-pay | Admitting: Internal Medicine

## 2013-10-20 NOTE — Telephone Encounter (Signed)
Pt missed call from office, pt is expecting lab results pls contact pt.

## 2013-10-20 NOTE — Telephone Encounter (Signed)
Left message for pt to call for lab results 

## 2013-10-20 NOTE — Telephone Encounter (Signed)
Message copied by Ricci Barker on Mon Oct 20, 2013  3:53 PM ------      Message from: Tresa Garter      Created: Sun Oct 19, 2013  9:30 PM       Please inform patient that his laboratory tests results is within normal limit. We encouraged him to engage in regular physical exercise to increase his good cholesterol ------

## 2013-10-24 ENCOUNTER — Telehealth: Payer: Self-pay | Admitting: *Deleted

## 2013-10-24 NOTE — Telephone Encounter (Signed)
I spoke with the pt and I informed him of his lab reults.

## 2013-11-10 ENCOUNTER — Ambulatory Visit: Payer: Self-pay

## 2014-10-26 ENCOUNTER — Encounter (HOSPITAL_BASED_OUTPATIENT_CLINIC_OR_DEPARTMENT_OTHER): Payer: Self-pay | Admitting: *Deleted

## 2014-10-26 ENCOUNTER — Emergency Department (HOSPITAL_BASED_OUTPATIENT_CLINIC_OR_DEPARTMENT_OTHER)
Admission: EM | Admit: 2014-10-26 | Discharge: 2014-10-26 | Disposition: A | Discharge status: Home / Self Care | Payer: Self-pay | Attending: Emergency Medicine | Admitting: Emergency Medicine

## 2014-10-26 DIAGNOSIS — Z87828 Personal history of other (healed) physical injury and trauma: Secondary | ICD-10-CM | POA: Insufficient documentation

## 2014-10-26 DIAGNOSIS — I10 Essential (primary) hypertension: Secondary | ICD-10-CM | POA: Insufficient documentation

## 2014-10-26 DIAGNOSIS — Z79899 Other long term (current) drug therapy: Secondary | ICD-10-CM | POA: Insufficient documentation

## 2014-10-26 DIAGNOSIS — Z872 Personal history of diseases of the skin and subcutaneous tissue: Secondary | ICD-10-CM | POA: Insufficient documentation

## 2014-10-26 DIAGNOSIS — F10230 Alcohol dependence with withdrawal, uncomplicated: Secondary | ICD-10-CM | POA: Insufficient documentation

## 2014-10-26 DIAGNOSIS — R110 Nausea: Secondary | ICD-10-CM

## 2014-10-26 DIAGNOSIS — Z8709 Personal history of other diseases of the respiratory system: Secondary | ICD-10-CM | POA: Insufficient documentation

## 2014-10-26 DIAGNOSIS — F10930 Alcohol use, unspecified with withdrawal, uncomplicated: Secondary | ICD-10-CM

## 2014-10-26 DIAGNOSIS — Z8739 Personal history of other diseases of the musculoskeletal system and connective tissue: Secondary | ICD-10-CM | POA: Insufficient documentation

## 2014-10-26 HISTORY — DX: Alcohol abuse, uncomplicated: F10.10

## 2014-10-26 LAB — CBC WITH DIFFERENTIAL/PLATELET
BASOS ABS: 0.1 10*3/uL (ref 0.0–0.1)
BASOS PCT: 1 % (ref 0–1)
Eosinophils Absolute: 0 10*3/uL (ref 0.0–0.7)
Eosinophils Relative: 1 % (ref 0–5)
HEMATOCRIT: 49.6 % (ref 39.0–52.0)
Hemoglobin: 17.5 g/dL — ABNORMAL HIGH (ref 13.0–17.0)
LYMPHS PCT: 35 % (ref 12–46)
Lymphs Abs: 1.4 10*3/uL (ref 0.7–4.0)
MCH: 32.1 pg (ref 26.0–34.0)
MCHC: 35.3 g/dL (ref 30.0–36.0)
MCV: 91 fL (ref 78.0–100.0)
Monocytes Absolute: 0.4 10*3/uL (ref 0.1–1.0)
Monocytes Relative: 9 % (ref 3–12)
NEUTROS ABS: 2.2 10*3/uL (ref 1.7–7.7)
Neutrophils Relative %: 54 % (ref 43–77)
Platelets: 157 10*3/uL (ref 150–400)
RBC: 5.45 MIL/uL (ref 4.22–5.81)
RDW: 13 % (ref 11.5–15.5)
WBC: 4.1 10*3/uL (ref 4.0–10.5)

## 2014-10-26 LAB — COMPREHENSIVE METABOLIC PANEL
ALBUMIN: 4.6 g/dL (ref 3.5–5.0)
ALT: 47 U/L (ref 17–63)
AST: 61 U/L — ABNORMAL HIGH (ref 15–41)
Alkaline Phosphatase: 40 U/L (ref 38–126)
Anion gap: 16 — ABNORMAL HIGH (ref 5–15)
BUN: 9 mg/dL (ref 6–20)
CHLORIDE: 99 mmol/L — AB (ref 101–111)
CO2: 22 mmol/L (ref 22–32)
CREATININE: 0.61 mg/dL (ref 0.61–1.24)
Calcium: 9.1 mg/dL (ref 8.9–10.3)
GFR calc Af Amer: >60 mL/min (ref >60)
GLUCOSE: 105 mg/dL — AB (ref 65–99)
Potassium: 3.4 mmol/L — ABNORMAL LOW (ref 3.5–5.1)
Sodium: 137 mmol/L (ref 135–145)
Total Bilirubin: 2.2 mg/dL — ABNORMAL HIGH (ref 0.3–1.2)
Total Protein: 8.4 g/dL — ABNORMAL HIGH (ref 6.5–8.1)

## 2014-10-26 LAB — LIPASE, BLOOD: LIPASE: 16 U/L — AB (ref 22–51)

## 2014-10-26 MED ORDER — HYDROCHLOROTHIAZIDE 25 MG PO TABS: 25.0000 mg | ORAL_TABLET | Freq: Every day | ORAL | Status: DC

## 2014-10-26 MED ORDER — LISINOPRIL 10 MG PO TABS
20.0000 mg | ORAL_TABLET | Freq: Once | ORAL | Status: AC
  Administered 2014-10-26: 20 mg via ORAL
  Filled 2014-10-26: qty 2

## 2014-10-26 MED ORDER — CHLORDIAZEPOXIDE HCL 25 MG PO CAPS: ORAL_CAPSULE | ORAL | Status: DC

## 2014-10-26 MED ORDER — LORAZEPAM 1 MG PO TABS
0.5000 mg | ORAL_TABLET | Freq: Once | ORAL | Status: AC
  Administered 2014-10-26: 0.5 mg via ORAL
  Filled 2014-10-26: qty 1

## 2014-10-26 MED ORDER — ONDANSETRON 4 MG PO TBDP
4.0000 mg | ORAL_TABLET | Freq: Once | ORAL | Status: AC
  Administered 2014-10-26: 4 mg via ORAL
  Filled 2014-10-26: qty 1

## 2014-10-26 MED ORDER — LISINOPRIL 20 MG PO TABS: 20.0000 mg | ORAL_TABLET | Freq: Every day | ORAL | Status: DC

## 2014-10-26 MED ORDER — HYDROCHLOROTHIAZIDE 25 MG PO TABS
25.0000 mg | ORAL_TABLET | Freq: Once | ORAL | Status: AC
  Administered 2014-10-26: 25 mg via ORAL
  Filled 2014-10-26: qty 1
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
## 2014-10-26 NOTE — ED Notes (Signed)
Pt. Reports nausea and insomnia.  Pt. Reports he wants to stop drinking and is trying to stop.  Pt. Reports he does not want to go to treatment and wants to do this at home.

## 2014-10-26 NOTE — ED Notes (Signed)
States he has been weaning himself off alcohol x 2 days. Not sleeping. Have hot flashes. He stopped taking BP medication 8 months ago and thinks he may need a Rx for same.

## 2014-10-26 NOTE — Discharge Instructions (Signed)
Alcohol Withdrawal Return for vomiting, tremors, or worsening symptoms. Follow-up with her primary care doctor within 2 days. Continue taking you blood pressure medication as prescribed. Alcohol withdrawal happens when you normally drink alcohol a lot and suddenly stop drinking. Alcohol withdrawal symptoms can be mild to very bad. Mild withdrawal symptoms can include feeling sick to your stomach (nauseous), headache, or feeling irritable. Bad withdrawal symptoms can include shakiness, being very nervous (anxious), and not thinking clearly.  HOME CARE  Join an alcohol support group.  Stay away from people or situations that make you want to drink.  Eat a healthy diet. Eat a lot of fresh fruits, vegetables, and lean meats. GET HELP RIGHT AWAY IF:   You become confused. You start to see and hear things that are not really there.  You feel your heart beating very fast.  You throw up (vomit) blood or cannot stop throwing up. This may be bright red or look like black coffee grounds.  You have blood in your poop (stool). This may be bright red, maroon colored, or black and tarry.  You are lightheaded or pass out (faint).  You develop a fever. MAKE SURE YOU:   Understand these instructions.  Will watch your condition.  Will get help right away if you are not doing well or get worse. Document Released: 08/16/2007 Document Revised: 05/22/2011 Document Reviewed: 08/16/2007 [facility] Patient Information 2015 [place], [place]. This information is not intended to replace advice given to you by your health care provider. Make sure you discuss any questions you have with your health care provider.

## 2014-10-26 NOTE — ED Provider Notes (Signed)
Care assumed from Hebert Soho, PA-C at shift change. Pt an alcoholic trying to wean himself off. Plan to medically clear pt with labs and d/c home with librium and his BP medication. If labs without acute abnormality, will discharge home.  Labs significant for a mildly elevated AST at 61. Anion gap of 16 which is just 1 point above normal limit. No other acute abnormalities. On exam, he is resting comfortably. Reports feeling much better after receiving Ativan, Zofran and his blood pressure medications. Requesting to be discharged home. Patient is stable for discharge. Resources given to help with alcohol abuse. No indication for admission at this time. Return precautions given. Patient states understanding of treatment care plan and is agreeable.  Results for orders placed or performed during the hospital encounter of 10/26/14  CBC with Differential  Result Value Ref Range   WBC 4.1 4.0 - 10.5 K/uL   RBC 5.45 4.22 - 5.81 MIL/uL   Hemoglobin 17.5 (H) 13.0 - 17.0 g/dL   HCT 49.6 39.0 - 52.0 %   MCV 91.0 78.0 - 100.0 fL   MCH 32.1 26.0 - 34.0 pg   MCHC 35.3 30.0 - 36.0 g/dL   RDW 13.0 11.5 - 15.5 %   Platelets 157 150 - 400 K/uL   Neutrophils Relative % 54 43 - 77 %   Neutro Abs 2.2 1.7 - 7.7 K/uL   Lymphocytes Relative 35 12 - 46 %   Lymphs Abs 1.4 0.7 - 4.0 K/uL   Monocytes Relative 9 3 - 12 %   Monocytes Absolute 0.4 0.1 - 1.0 K/uL   Eosinophils Relative 1 0 - 5 %   Eosinophils Absolute 0.0 0.0 - 0.7 K/uL   Basophils Relative 1 0 - 1 %   Basophils Absolute 0.1 0.0 - 0.1 K/uL  Comprehensive metabolic panel  Result Value Ref Range   Sodium 137 135 - 145 mmol/L   Potassium 3.4 (L) 3.5 - 5.1 mmol/L   Chloride 99 (L) 101 - 111 mmol/L   CO2 22 22 - 32 mmol/L   Glucose, Bld 105 (H) 65 - 99 mg/dL   BUN 9 6 - 20 mg/dL   Creatinine, Ser 0.61 0.61 - 1.24 mg/dL   Calcium 9.1 8.9 - 10.3 mg/dL   Total Protein 8.4 (H) 6.5 - 8.1 g/dL   Albumin 4.6 3.5 - 5.0 g/dL   AST 61 (H) 15 - 41 U/L   ALT 47 17 - 63 U/L   Alkaline Phosphatase 40 38 - 126 U/L   Total Bilirubin 2.2 (H) 0.3 - 1.2 mg/dL   GFR calc non Af Amer >60 >60 mL/min   GFR calc Af Amer >60 >60 mL/min   Anion gap 16 (H) 5 - 15  Lipase, blood  Result Value Ref Range   Lipase 16 (L) 22 - 51 U/L    Carman Ching, PA-C 10/26/14 1705  Debby Freiberg, MD 11/01/14 [phone]
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
## 2014-10-26 NOTE — ED Notes (Signed)
Pt. Reports he has had "2 bumps of cocaine" in the last 2 week.

## 2014-11-02 ENCOUNTER — Ambulatory Visit: Payer: Self-pay | Admitting: Internal Medicine

## 2014-11-05 ENCOUNTER — Ambulatory Visit: Payer: Self-pay | Admitting: Internal Medicine

## 2015-12-14 ENCOUNTER — Encounter (HOSPITAL_BASED_OUTPATIENT_CLINIC_OR_DEPARTMENT_OTHER): Payer: Self-pay

## 2015-12-14 ENCOUNTER — Emergency Department (HOSPITAL_BASED_OUTPATIENT_CLINIC_OR_DEPARTMENT_OTHER)
Admission: EM | Admit: 2015-12-14 | Discharge: 2015-12-14 | Disposition: A | Discharge status: Home / Self Care | Payer: Self-pay | Attending: Emergency Medicine | Admitting: Emergency Medicine

## 2015-12-14 ENCOUNTER — Emergency Department (HOSPITAL_BASED_OUTPATIENT_CLINIC_OR_DEPARTMENT_OTHER): Payer: Self-pay

## 2015-12-14 DIAGNOSIS — Y9301 Activity, walking, marching and hiking: Secondary | ICD-10-CM | POA: Insufficient documentation

## 2015-12-14 DIAGNOSIS — Y990 Civilian activity done for income or pay: Secondary | ICD-10-CM | POA: Insufficient documentation

## 2015-12-14 DIAGNOSIS — S8251XA Displaced fracture of medial malleolus of right tibia, initial encounter for closed fracture: Secondary | ICD-10-CM | POA: Insufficient documentation

## 2015-12-14 DIAGNOSIS — X58XXXA Exposure to other specified factors, initial encounter: Secondary | ICD-10-CM | POA: Insufficient documentation

## 2015-12-14 DIAGNOSIS — Y929 Unspecified place or not applicable: Secondary | ICD-10-CM | POA: Insufficient documentation

## 2015-12-14 DIAGNOSIS — S82831A Other fracture of upper and lower end of right fibula, initial encounter for closed fracture: Secondary | ICD-10-CM | POA: Insufficient documentation

## 2015-12-14 DIAGNOSIS — Z79899 Other long term (current) drug therapy: Secondary | ICD-10-CM | POA: Insufficient documentation

## 2015-12-14 DIAGNOSIS — I10 Essential (primary) hypertension: Secondary | ICD-10-CM | POA: Insufficient documentation

## 2015-12-14 LAB — COMPREHENSIVE METABOLIC PANEL
ALT: 30 U/L (ref 17–63)
ANION GAP: 9 (ref 5–15)
AST: 25 U/L (ref 15–41)
Albumin: 4 g/dL (ref 3.5–5.0)
Alkaline Phosphatase: 37 U/L — ABNORMAL LOW (ref 38–126)
BUN: 6 mg/dL (ref 6–20)
CO2: 24 mmol/L (ref 22–32)
Calcium: 8.9 mg/dL (ref 8.9–10.3)
Chloride: 106 mmol/L (ref 101–111)
Creatinine, Ser: 0.59 mg/dL — ABNORMAL LOW (ref 0.61–1.24)
GFR calc Af Amer: >60 mL/min (ref >60)
GFR calc non Af Amer: >60 mL/min (ref >60)
Glucose, Bld: 101 mg/dL — ABNORMAL HIGH (ref 65–99)
Potassium: 3.4 mmol/L — ABNORMAL LOW (ref 3.5–5.1)
SODIUM: 139 mmol/L (ref 135–145)
Total Bilirubin: 0.5 mg/dL (ref 0.3–1.2)
Total Protein: 7.7 g/dL (ref 6.5–8.1)

## 2015-12-14 LAB — CBC WITH DIFFERENTIAL/PLATELET
BASOS ABS: 0.1 10*3/uL (ref 0.0–0.1)
BASOS PCT: 1 %
Eosinophils Absolute: 0.1 10*3/uL (ref 0.0–0.7)
Eosinophils Relative: 1 %
HCT: 45.1 % (ref 39.0–52.0)
Hemoglobin: 15.3 g/dL (ref 13.0–17.0)
Lymphocytes Relative: 34 %
Lymphs Abs: 2.4 10*3/uL (ref 0.7–4.0)
MCH: 33 pg (ref 26.0–34.0)
MCHC: 33.9 g/dL (ref 30.0–36.0)
MCV: 97.2 fL (ref 78.0–100.0)
MONO ABS: 0.7 10*3/uL (ref 0.1–1.0)
Monocytes Relative: 11 %
NEUTROS ABS: 3.7 10*3/uL (ref 1.7–7.7)
Neutrophils Relative %: 53 %
Platelets: 207 10*3/uL (ref 150–400)
RBC: 4.64 MIL/uL (ref 4.22–5.81)
RDW: 12.9 % (ref 11.5–15.5)
WBC: 6.9 10*3/uL (ref 4.0–10.5)

## 2015-12-14 LAB — D-DIMER, QUANTITATIVE: D-Dimer, Quant: 0.69 ug/mL-FEU — ABNORMAL HIGH (ref 0.00–0.50)

## 2015-12-14 MED ORDER — ASPIRIN EC 81 MG PO TBEC: 81.0000 mg | DELAYED_RELEASE_TABLET | Freq: Every day | ORAL | 0 refills | Status: DC

## 2015-12-14 MED ORDER — ONDANSETRON 4 MG PO TBDP
4.0000 mg | ORAL_TABLET | Freq: Once | ORAL | Status: DC
  Filled 2015-12-14: qty 1

## 2015-12-14 MED ORDER — HYDROCODONE-ACETAMINOPHEN 5-325 MG PO TABS
2.0000 | ORAL_TABLET | Freq: Once | ORAL | Status: DC
  Filled 2015-12-14: qty 2

## 2015-12-14 MED ORDER — HYDROCODONE-ACETAMINOPHEN 5-325 MG PO TABS: 1.0000 | ORAL_TABLET | ORAL | 0 refills | Status: DC | PRN

## 2015-12-14 NOTE — ED Notes (Signed)
MD at bedside. 
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
## 2015-12-14 NOTE — ED Notes (Signed)
Patient transported to X-ray 

## 2015-12-14 NOTE — ED Triage Notes (Signed)
Woke up with pain, swelling to right leg-denies injury-to tx area via w/c-able to stand and pivot to stretcher

## 2015-12-14 NOTE — Discharge Instructions (Signed)
Keep your boot on until followed up by Orthopedics  Do not put any weight on your ankle  Use your crutches at all times  Elevate/lift up your leg as often as possible to help with pain and swelling

## 2017-01-31 ENCOUNTER — Emergency Department (HOSPITAL_BASED_OUTPATIENT_CLINIC_OR_DEPARTMENT_OTHER): Payer: Self-pay

## 2017-01-31 ENCOUNTER — Encounter (HOSPITAL_BASED_OUTPATIENT_CLINIC_OR_DEPARTMENT_OTHER): Payer: Self-pay | Admitting: *Deleted

## 2017-01-31 ENCOUNTER — Other Ambulatory Visit: Payer: Self-pay

## 2017-01-31 ENCOUNTER — Emergency Department (HOSPITAL_BASED_OUTPATIENT_CLINIC_OR_DEPARTMENT_OTHER)
Admission: EM | Admit: 2017-01-31 | Discharge: 2017-01-31 | Disposition: A | Discharge status: Home / Self Care | Payer: Self-pay | Attending: Emergency Medicine | Admitting: Emergency Medicine

## 2017-01-31 DIAGNOSIS — Z79899 Other long term (current) drug therapy: Secondary | ICD-10-CM | POA: Insufficient documentation

## 2017-01-31 DIAGNOSIS — M7989 Other specified soft tissue disorders: Secondary | ICD-10-CM

## 2017-01-31 DIAGNOSIS — Z7982 Long term (current) use of aspirin: Secondary | ICD-10-CM | POA: Insufficient documentation

## 2017-01-31 DIAGNOSIS — I10 Essential (primary) hypertension: Secondary | ICD-10-CM | POA: Insufficient documentation

## 2017-01-31 DIAGNOSIS — R778 Other specified abnormalities of plasma proteins: Secondary | ICD-10-CM

## 2017-01-31 DIAGNOSIS — J36 Peritonsillar abscess: Secondary | ICD-10-CM

## 2017-01-31 DIAGNOSIS — R7989 Other specified abnormal findings of blood chemistry: Secondary | ICD-10-CM

## 2017-01-31 DIAGNOSIS — R748 Abnormal levels of other serum enzymes: Secondary | ICD-10-CM | POA: Insufficient documentation

## 2017-01-31 DIAGNOSIS — R2243 Localized swelling, mass and lump, lower limb, bilateral: Secondary | ICD-10-CM | POA: Insufficient documentation

## 2017-01-31 DIAGNOSIS — R0602 Shortness of breath: Secondary | ICD-10-CM | POA: Insufficient documentation

## 2017-01-31 LAB — COMPREHENSIVE METABOLIC PANEL
ALBUMIN: 3.8 g/dL (ref 3.5–5.0)
ALT: 27 U/L (ref 17–63)
AST: 31 U/L (ref 15–41)
Alkaline Phosphatase: 42 U/L (ref 38–126)
Anion gap: 7 (ref 5–15)
BUN: 8 mg/dL (ref 6–20)
CO2: 29 mmol/L (ref 22–32)
Calcium: 9.1 mg/dL (ref 8.9–10.3)
Chloride: 100 mmol/L — ABNORMAL LOW (ref 101–111)
Creatinine, Ser: 0.8 mg/dL (ref 0.61–1.24)
GFR calc Af Amer: >60 mL/min (ref >60)
Glucose, Bld: 147 mg/dL — ABNORMAL HIGH (ref 65–99)
POTASSIUM: 3.7 mmol/L (ref 3.5–5.1)
Sodium: 136 mmol/L (ref 135–145)
TOTAL PROTEIN: 8.2 g/dL — AB (ref 6.5–8.1)
Total Bilirubin: 0.9 mg/dL (ref 0.3–1.2)

## 2017-01-31 LAB — CBC WITH DIFFERENTIAL/PLATELET
Basophils Absolute: 0 10*3/uL (ref 0.0–0.1)
Basophils Relative: 1 %
EOS PCT: 2 %
Eosinophils Absolute: 0.1 10*3/uL (ref 0.0–0.7)
HCT: 53.4 % — ABNORMAL HIGH (ref 39.0–52.0)
Hemoglobin: 17.4 g/dL — ABNORMAL HIGH (ref 13.0–17.0)
Lymphocytes Relative: 34 %
Lymphs Abs: 1.5 10*3/uL (ref 0.7–4.0)
MCH: 31.9 pg (ref 26.0–34.0)
MCHC: 32.6 g/dL (ref 30.0–36.0)
MCV: 97.8 fL (ref 78.0–100.0)
Monocytes Absolute: 0.3 10*3/uL (ref 0.1–1.0)
Monocytes Relative: 7 %
NEUTROS ABS: 2.4 10*3/uL (ref 1.7–7.7)
Neutrophils Relative %: 56 %
Platelets: 188 10*3/uL (ref 150–400)
RBC: 5.46 MIL/uL (ref 4.22–5.81)
RDW: 13.4 % (ref 11.5–15.5)
WBC: 4.3 10*3/uL (ref 4.0–10.5)

## 2017-01-31 LAB — URINALYSIS, ROUTINE W REFLEX MICROSCOPIC
Bilirubin Urine: NEGATIVE
Glucose, UA: NEGATIVE mg/dL
Hgb urine dipstick: NEGATIVE
Ketones, ur: NEGATIVE mg/dL
LEUKOCYTES UA: NEGATIVE
Nitrite: NEGATIVE
PROTEIN: NEGATIVE mg/dL
Specific Gravity, Urine: 1.02 (ref 1.005–1.030)
pH: 6 (ref 5.0–8.0)

## 2017-01-31 LAB — TROPONIN I
TROPONIN I: 0.03 ng/mL — AB (ref <0.03)
Troponin I: 0.04 ng/mL (ref <0.03)

## 2017-01-31 LAB — BRAIN NATRIURETIC PEPTIDE: B NATRIURETIC PEPTIDE 5: 26.5 pg/mL (ref 0.0–100.0)

## 2017-01-31 MED ORDER — FUROSEMIDE 20 MG PO TABS: 20.0000 mg | ORAL_TABLET | Freq: Every day | ORAL | 0 refills | Status: DC

## 2017-01-31 MED ORDER — HYDROCHLOROTHIAZIDE 25 MG PO TABS: 25.0000 mg | ORAL_TABLET | Freq: Once | ORAL | Status: DC

## 2017-01-31 MED ORDER — LISINOPRIL-HYDROCHLOROTHIAZIDE 20-25 MG PO TABS: 1.0000 | ORAL_TABLET | Freq: Every day | ORAL | 0 refills | Status: DC

## 2017-01-31 MED ORDER — HYDROCHLOROTHIAZIDE 25 MG PO TABS
25.0000 mg | ORAL_TABLET | Freq: Once | ORAL | Status: AC
  Administered 2017-01-31: 25 mg via ORAL
  Filled 2017-01-31: qty 1

## 2017-01-31 MED ORDER — FUROSEMIDE 10 MG/ML IJ SOLN
40.0000 mg | Freq: Once | INTRAMUSCULAR | Status: AC
  Administered 2017-01-31: 40 mg via INTRAVENOUS
  Filled 2017-01-31: qty 4

## 2017-01-31 MED ORDER — ASPIRIN EC 81 MG PO TBEC
81.0000 mg | DELAYED_RELEASE_TABLET | Freq: Once | ORAL | Status: AC
Start: 2017-01-31 — End: 2017-01-31
  Administered 2017-01-31: 81 mg via ORAL
  Filled 2017-01-31: qty 1

## 2017-01-31 MED ORDER — LISINOPRIL 10 MG PO TABS
20.0000 mg | ORAL_TABLET | Freq: Once | ORAL | Status: AC
  Administered 2017-01-31: 20 mg via ORAL
  Filled 2017-01-31: qty 2

## 2017-01-31 MED ORDER — LISINOPRIL 10 MG PO TABS: 20.0000 mg | ORAL_TABLET | Freq: Once | ORAL | Status: DC

## 2017-01-31 MED FILL — FUROSEMIDE 20 MG TAB: 20 | 3 days supply | Qty: 3 | Fill #0

## 2017-01-31 MED FILL — LISINOPRIL-HCTZ 20-25 MG TA: 20-25 | 30 days supply | Qty: 30 | Fill #0

## 2017-01-31 NOTE — ED Notes (Signed)
Patient walked with moderate assistance of two people. Patient's O2 stats dropped to 92% with HR at 108. Patient was only able to make it to the door and back to the bed. Dr. Darl Householder made aware.

## 2017-01-31 NOTE — ED Triage Notes (Signed)
Pt reports swelling to his legs and feet since stopping his htn meds a year ago due to life circumstances. Pt states he will have insurance in 2 weeks and can resume his meds. Pt has +2 pitting edema to bilateral le, left more than right, also DOE noted with transfering from w/c to bed. Pt teaching regarding strain on his heart and kidneys r/t not taking his htn meds. Pt informed that if he does not have insurance we can help him find resources to get his meds, and that he should take them daily as directed to avoid future medical problems. Pt verbalizes understanding.
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
## 2017-01-31 NOTE — Discharge Instructions (Signed)
Take HCTZ/lisinopril as prescribed.   Take ASA 81 mg daily.   Take lasix 20 mg daily x 3 days.   You have some heart damage from uncontrolled blood pressure. Please take your blood pressure meds. Please call cardiology in 3 days for follow up.   Return to ER if you have worse chest pain, shortness of breath, leg swelling, abdominal pain.

## 2017-03-14 ENCOUNTER — Ambulatory Visit (INDEPENDENT_AMBULATORY_CARE_PROVIDER_SITE_OTHER): Payer: 59 | Admitting: Family Medicine

## 2017-03-14 ENCOUNTER — Ambulatory Visit (HOSPITAL_BASED_OUTPATIENT_CLINIC_OR_DEPARTMENT_OTHER)
Admission: RE | Admit: 2017-03-14 | Discharge: 2017-03-14 | Disposition: A | Discharge status: Home / Self Care | Payer: 59 | Source: Ambulatory Visit | Attending: Family Medicine | Admitting: Family Medicine

## 2017-03-14 ENCOUNTER — Encounter: Payer: Self-pay | Admitting: Family Medicine

## 2017-03-14 VITALS — BP 165/101 | HR 81 | Temp 98.3°F | Ht 75.0 in | Wt 343.0 lb

## 2017-03-14 DIAGNOSIS — R739 Hyperglycemia, unspecified: Secondary | ICD-10-CM | POA: Diagnosis not present

## 2017-03-14 DIAGNOSIS — G8929 Other chronic pain: Secondary | ICD-10-CM

## 2017-03-14 DIAGNOSIS — I10 Essential (primary) hypertension: Secondary | ICD-10-CM

## 2017-03-14 DIAGNOSIS — R0681 Apnea, not elsewhere classified: Secondary | ICD-10-CM | POA: Diagnosis not present

## 2017-03-14 DIAGNOSIS — M25562 Pain in left knee: Secondary | ICD-10-CM | POA: Diagnosis not present

## 2017-03-14 DIAGNOSIS — M25561 Pain in right knee: Secondary | ICD-10-CM | POA: Diagnosis not present

## 2017-03-14 DIAGNOSIS — M170 Bilateral primary osteoarthritis of knee: Secondary | ICD-10-CM | POA: Insufficient documentation

## 2017-03-14 DIAGNOSIS — M25461 Effusion, right knee: Secondary | ICD-10-CM | POA: Insufficient documentation

## 2017-03-14 DIAGNOSIS — M25462 Effusion, left knee: Secondary | ICD-10-CM | POA: Diagnosis not present

## 2017-03-14 MED ORDER — LISINOPRIL-HYDROCHLOROTHIAZIDE 20-25 MG PO TABS: 1.0000 | ORAL_TABLET | Freq: Every day | ORAL | 2 refills | Status: DC

## 2017-03-14 MED ORDER — AMLODIPINE BESYLATE 5 MG PO TABS: 5.0000 mg | ORAL_TABLET | Freq: Every day | ORAL | 3 refills | Status: DC

## 2017-03-14 MED ORDER — NAPROXEN 500 MG PO TABS: 500.0000 mg | ORAL_TABLET | Freq: Two times a day (BID) | ORAL | 1 refills | Status: DC | PRN

## 2017-03-14 MED FILL — NAPROXEN 500 MG TABLET: 500 | 15 days supply | Qty: 30 | Fill #0

## 2017-03-14 MED FILL — AMLODIPINE BESYLATE 5 MG TA: 5 | 30 days supply | Qty: 30 | Fill #0

## 2017-03-14 MED FILL — LISINOPRIL-HCTZ 20-25 MG TA: 20-25 | 30 days supply | Qty: 30 | Fill #0
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
## 2017-03-14 NOTE — Patient Instructions (Addendum)
If you do not hear anything about your referral in the next 1-2 weeks, call our office and ask for an update.  We will let you know if you want to see a weight loss specialist.    Healthy Eating Plan Many factors influence your heart health, including eating and exercise habits. Heart (coronary) risk increases with abnormal blood fat (lipid) levels. Heart-healthy meal planning includes limiting unhealthy fats, increasing healthy fats, and making other small dietary changes. This includes maintaining a healthy body weight to help keep lipid levels within a normal range.  WHAT IS MY PLAN?  Your health care provider recommends that you:  Drink a glass of water before meals to help with satiety.  Eat slowly.  An alternative to the water is to add Metamucil. This will help with satiety as well. It does contain calories, unlike water.  WHAT TYPES OF FAT SHOULD I CHOOSE?  Choose healthy fats more often. Choose monounsaturated and polyunsaturated fats, such as olive oil and canola oil, flaxseeds, walnuts, almonds, and seeds.  Eat more omega-3 fats. Good choices include salmon, mackerel, sardines, tuna, flaxseed oil, and ground flaxseeds. Aim to eat fish at least two times each week.  Avoid foods with partially hydrogenated oils in them. These contain trans fats. Examples of foods that contain trans fats are stick margarine, some tub margarines, cookies, crackers, and other baked goods. If you are going to avoid a fat, this is the one to avoid!  WHAT GENERAL GUIDELINES DO I NEED TO FOLLOW?  Check food labels carefully to identify foods with trans fats. Avoid these types of options when possible.  Fill one half of your plate with vegetables and green salads. Eat 4-5 servings of vegetables per day. A serving of vegetables equals 1 cup of raw leafy vegetables,  cup of raw or cooked cut-up vegetables, or  cup of vegetable juice.  Fill one fourth of your plate with whole grains. Look for the word  "whole" as the first word in the ingredient list.  Fill one fourth of your plate with lean protein foods.  Eat 4-5 servings of fruit per day. A serving of fruit equals one medium whole fruit,  cup of dried fruit,  cup of fresh, frozen, or canned fruit. Try to avoid fruits in cups/syrups as the sugar content can be high.  Eat more foods that contain soluble fiber. Examples of foods that contain this type of fiber are apples, broccoli, carrots, beans, peas, and barley. Aim to get 20-30 g of fiber per day.  Eat more home-cooked food and less restaurant, buffet, and fast food.  Limit or avoid alcohol.  Limit foods that are high in starch and sugar.  Avoid fried foods when able.  Cook foods by using methods other than frying. Baking, boiling, grilling, and broiling are all great options. Other fat-reducing suggestions include: ? Removing the skin from poultry. ? Removing all visible fats from meats. ? Skimming the fat off of stews, soups, and gravies before serving them. ? Steaming vegetables in water or broth.  Lose weight if you are overweight. Losing just 5-10% of your initial body weight can help your overall health and prevent diseases such as diabetes and heart disease.  Increase your consumption of nuts, legumes, and seeds to 4-5 servings per week. One serving of dried beans or legumes equals  cup after being cooked, one serving of nuts equals 1 ounces, and one serving of seeds equals  ounce or 1 tablespoon.  WHAT ARE GOOD  FOODS CAN I EAT? Grains Grainy breads (try to find bread that is 3 g of fiber per slice or greater), oatmeal, light popcorn. Whole-grain cereals. Rice and pasta, including brown rice and those that are made with whole wheat. Edamame pasta is a great alternative to grain pasta. It has a higher protein content. Try to avoid significant consumption of white bread, sugary cereals, or pastries/baked goods.  Vegetables All vegetables. Cooked white potatoes do not  count as vegetables.  Fruits All fruits, but limit pineapple and bananas as these fruits have a higher sugar content.  Meats and Other Protein Sources Lean, well-trimmed beef, veal, pork, and lamb. Chicken and [place] without skin. All fish and shellfish. Wild duck, rabbit, pheasant, and venison. Egg whites or low-cholesterol egg substitutes. Dried beans, peas, lentils, and tofu.Seeds and most nuts.  Dairy Low-fat or nonfat cheeses, including ricotta, string, and mozzarella. Skim or 1% milk that is liquid, powdered, or evaporated. Buttermilk that is made with low-fat milk. Nonfat or low-fat yogurt. Soy/Almond milk are good alternatives if you cannot handle dairy.  Beverages Water is the best for you. Sports drinks with less sugar are more desirable unless you are a highly active athlete.  Sweets and Desserts Sherbets and fruit ices. Honey, jam, marmalade, jelly, and syrups. Dark chocolate.  Eat all sweets and desserts in moderation.  Fats and Oils Nonhydrogenated (trans-free) margarines. Vegetable oils, including soybean, sesame, sunflower, olive, peanut, safflower, corn, canola, and cottonseed. Salad dressings or mayonnaise that are made with a vegetable oil. Limit added fats and oils that you use for cooking, baking, salads, and as spreads.  Other Cocoa powder. Coffee and tea. Most condiments.  The items listed above may not be a complete list of recommended foods or beverages. Contact your dietitian for more options.

## 2017-03-14 NOTE — Progress Notes (Signed)
Pre visit review using our clinic review tool, if applicable. No additional management support is needed unless otherwise documented below in the visit note. 

## 2017-03-15 LAB — BASIC METABOLIC PANEL
BUN: 8 mg/dL (ref 6–23)
CHLORIDE: 101 meq/L (ref 96–112)
CO2: 31 mEq/L (ref 19–32)
Calcium: 9.3 mg/dL (ref 8.4–10.5)
Creatinine, Ser: 0.66 mg/dL (ref 0.40–1.50)
GFR: 166.9 mL/min (ref >60.00)
GLUCOSE: 109 mg/dL — AB (ref 70–99)
Potassium: 3.7 mEq/L (ref 3.5–5.1)
SODIUM: 139 meq/L (ref 135–145)

## 2017-03-15 LAB — HEMOGLOBIN A1C: Hgb A1c MFr Bld: 6.5 % (ref 4.6–6.5)

## 2017-03-15 LAB — URIC ACID: Uric Acid, Serum: 6.8 mg/dL (ref 4.0–7.8)

## 2017-03-19 ENCOUNTER — Other Ambulatory Visit: Payer: Self-pay | Admitting: Family Medicine

## 2017-03-19 MED ORDER — ALLOPURINOL 100 MG PO TABS: 100.0000 mg | ORAL_TABLET | Freq: Every day | ORAL | 3 refills | Status: DC

## 2017-03-19 MED FILL — ALLOPURINOL 100 MG TABS: 100 | 30 days supply | Qty: 30 | Fill #0

## 2017-04-12 ENCOUNTER — Ambulatory Visit (INDEPENDENT_AMBULATORY_CARE_PROVIDER_SITE_OTHER): Payer: 59 | Admitting: Family Medicine

## 2017-04-12 ENCOUNTER — Encounter: Payer: Self-pay | Admitting: Family Medicine

## 2017-04-12 VITALS — BP 142/78 | HR 99 | Temp 97.9°F | Ht 75.0 in | Wt 343.0 lb

## 2017-04-12 DIAGNOSIS — M1A0690 Idiopathic chronic gout, unspecified knee, without tophus (tophi): Secondary | ICD-10-CM

## 2017-04-12 DIAGNOSIS — E669 Obesity, unspecified: Secondary | ICD-10-CM

## 2017-04-12 DIAGNOSIS — Z23 Encounter for immunization: Secondary | ICD-10-CM

## 2017-04-12 DIAGNOSIS — I10 Essential (primary) hypertension: Secondary | ICD-10-CM | POA: Diagnosis not present

## 2017-04-12 DIAGNOSIS — E1169 Type 2 diabetes mellitus with other specified complication: Secondary | ICD-10-CM | POA: Insufficient documentation

## 2017-04-12 MED ORDER — PREDNISONE 20 MG PO TABS: 40.0000 mg | ORAL_TABLET | Freq: Every day | ORAL | 0 refills | Status: DC

## 2017-04-12 MED ORDER — ATORVASTATIN CALCIUM 40 MG PO TABS: 40.0000 mg | ORAL_TABLET | Freq: Every day | ORAL | 3 refills | Status: DC

## 2017-04-12 MED FILL — ATORVASTATIN 40 MG TABLET: 40 | 30 days supply | Qty: 30 | Fill #0

## 2017-04-12 MED FILL — predniSONE 20 MG TABS: 20 | 5 days supply | Qty: 10 | Fill #0

## 2017-04-12 NOTE — Progress Notes (Signed)
Pre visit review using our clinic review tool, if applicable. No additional management support is needed unless otherwise documented below in the visit note. 

## 2017-04-12 NOTE — Patient Instructions (Addendum)
Do not take naproxen and prednisone at the same time.  Keep the diet clean.  Continue on current dose of allopurinol until we see your uric acid level.  If things aren't better in 1 week, send a MyChart message and we will get in you into physical therapy.  If you do not hear anything about your referral in the next 1-2 weeks, call our office and ask for an update.  Check sugars and BP 1-2 times per week.   Stop aspirin.   Let us know if you need anything.
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
## 2017-04-13 LAB — URIC ACID: Uric Acid, Serum: 6.4 mg/dL (ref 4.0–7.8)

## 2017-04-13 LAB — COMPREHENSIVE METABOLIC PANEL
ALT: 20 U/L (ref 0–53)
AST: 19 U/L (ref 0–37)
Albumin: 4 g/dL (ref 3.5–5.2)
Alkaline Phosphatase: 42 U/L (ref 39–117)
BUN: 11 mg/dL (ref 6–23)
CO2: 28 mEq/L (ref 19–32)
Calcium: 9.6 mg/dL (ref 8.4–10.5)
Chloride: 100 mEq/L (ref 96–112)
Creatinine, Ser: 0.62 mg/dL (ref 0.40–1.50)
GFR: 179.33 mL/min (ref >60.00)
GLUCOSE: 97 mg/dL (ref 70–99)
POTASSIUM: 3.7 meq/L (ref 3.5–5.1)
SODIUM: 139 meq/L (ref 135–145)
Total Bilirubin: 0.5 mg/dL (ref 0.2–1.2)
Total Protein: 7.9 g/dL (ref 6.0–8.3)

## 2017-04-13 LAB — LIPID PANEL
Cholesterol: 172 mg/dL (ref 0–200)
HDL: 38.9 mg/dL — ABNORMAL LOW (ref >39.00)
NONHDL: 132.77
Total CHOL/HDL Ratio: 4
Triglycerides: 342 mg/dL — ABNORMAL HIGH (ref 0.0–149.0)
VLDL: 68.4 mg/dL — AB (ref 0.0–40.0)

## 2017-04-13 LAB — LDL CHOLESTEROL, DIRECT: LDL DIRECT: 100 mg/dL

## 2017-04-13 NOTE — Addendum Note (Signed)
Addended by: Caffie Pinto on: 04/13/2017 03:04 PM   Modules accepted: Orders

## 2017-04-14 ENCOUNTER — Other Ambulatory Visit: Payer: Self-pay | Admitting: Family Medicine

## 2017-04-14 LAB — MICROALBUMIN / CREATININE URINE RATIO
Creatinine, Urine: 111 mg/dL (ref 20–320)
Microalb Creat Ratio: 46 ug/mg{creat} — ABNORMAL HIGH
Microalb, Ur: 5.1 mg/dL

## 2017-04-14 MED ORDER — ALLOPURINOL 100 MG PO TABS: 200.0000 mg | ORAL_TABLET | Freq: Every day | ORAL | 3 refills | Status: DC

## 2017-04-24 MED FILL — AMLODIPINE BESYLATE 5 MG TA: 5 | 30 days supply | Qty: 30 | Fill #1

## 2017-04-24 MED FILL — LISINOPRIL-HCTZ 20-25 MG TA: 20-25 | 30 days supply | Qty: 30 | Fill #1

## 2017-04-24 MED FILL — ALLOPURINOL 100 MG TABS: 100 | 30 days supply | Qty: 30 | Fill #1

## 2017-05-02 ENCOUNTER — Encounter: Payer: Self-pay | Admitting: Family Medicine

## 2017-05-02 ENCOUNTER — Ambulatory Visit (INDEPENDENT_AMBULATORY_CARE_PROVIDER_SITE_OTHER): Payer: 59 | Admitting: Family Medicine

## 2017-05-02 VITALS — BP 134/72 | HR 89 | Temp 98.6°F | Ht 75.0 in | Wt 343.0 lb

## 2017-05-02 DIAGNOSIS — M25561 Pain in right knee: Secondary | ICD-10-CM | POA: Diagnosis not present

## 2017-05-02 DIAGNOSIS — M1A0690 Idiopathic chronic gout, unspecified knee, without tophus (tophi): Secondary | ICD-10-CM

## 2017-05-02 DIAGNOSIS — Z789 Other specified health status: Secondary | ICD-10-CM | POA: Diagnosis not present

## 2017-05-02 DIAGNOSIS — M25562 Pain in left knee: Secondary | ICD-10-CM | POA: Diagnosis not present

## 2017-05-02 DIAGNOSIS — G8929 Other chronic pain: Secondary | ICD-10-CM | POA: Diagnosis not present

## 2017-05-02 MED ORDER — NAPROXEN 500 MG PO TABS: 500.0000 mg | ORAL_TABLET | Freq: Two times a day (BID) | ORAL | 1 refills | Status: DC | PRN

## 2017-05-02 NOTE — Patient Instructions (Addendum)
If you do not hear anything about your referral in the next week, call our office and ask for an update.  Keep up the good work with improving your diet.  EXERCISES  RANGE OF MOTION (ROM) AND STRETCHING EXERCISES - Low Back Prain Most people with lower back pain will find that their symptoms get worse with excessive bending forward (flexion) or arching at the lower back (extension). The exercises that will help resolve your symptoms will focus on the opposite motion.  If you have pain, numbness or tingling which travels down into your buttocks, leg or foot, the goal of the therapy is for these symptoms to move closer to your back and eventually resolve. Sometimes, these leg symptoms will get better, but your lower back pain may worsen. This is often an indication of progress in your rehabilitation. Be very alert to any changes in your symptoms and the activities in which you participated in the 24 hours prior to the change. Sharing this information with your caregiver will allow him or her to most efficiently treat your condition. These exercises may help you when beginning to rehabilitate your injury. Your symptoms may resolve with or without further involvement from your physician, physical therapist or athletic trainer. While completing these exercises, remember:   Restoring tissue flexibility helps normal motion to return to the joints. This allows healthier, less painful movement and activity.  An effective stretch should be held for at least 30 seconds.  A stretch should never be painful. You should only feel a gentle lengthening or release in the stretched tissue. FLEXION RANGE OF MOTION AND STRETCHING EXERCISES:  STRETCH - Flexion, Single Knee to Chest   Lie on a firm bed or floor with both legs extended in front of you.  Keeping one leg in contact with the floor, bring your opposite knee to your chest. Hold your leg in place by either grabbing behind your thigh or at your knee.  Pull  until you feel a gentle stretch in your low back. Hold 30 seconds.  Slowly release your grasp and repeat the exercise with the opposite side. Repeat 2 times. Complete this exercise 3 times per week.   STRETCH - Flexion, Double Knee to Chest  Lie on a firm bed or floor with both legs extended in front of you.  Keeping one leg in contact with the floor, bring your opposite knee to your chest.  Tense your stomach muscles to support your back and then lift your other knee to your chest. Hold your legs in place by either grabbing behind your thighs or at your knees.  Pull both knees toward your chest until you feel a gentle stretch in your low back. Hold 30 seconds.  Tense your stomach muscles and slowly return one leg at a time to the floor. Repeat 2 times. Complete this exercise 3 times per week.   STRETCH - Low Trunk Rotation  Lie on a firm bed or floor. Keeping your legs in front of you, bend your knees so they are both pointed toward the ceiling and your feet are flat on the floor.  Extend your arms out to the side. This will stabilize your upper body by keeping your shoulders in contact with the floor.  Gently and slowly drop both knees together to one side until you feel a gentle stretch in your low back. Hold for 30 seconds.  Tense your stomach muscles to support your lower back as you bring your knees back to the starting  position. Repeat the exercise to the other side. Repeat 2 times. Complete this exercise at least 3 times per week.   EXTENSION RANGE OF MOTION AND FLEXIBILITY EXERCISES:  STRETCH - Extension, Prone on Elbows   Lie on your stomach on the floor, a bed will be too soft. Place your palms about shoulder width apart and at the height of your head.  Place your elbows under your shoulders. If this is too painful, stack pillows under your chest.  Allow your body to relax so that your hips drop lower and make contact more completely with the floor.  Hold this  position for 30 seconds.  Slowly return to lying flat on the floor. Repeat 2 times. Complete this exercise 3 times per week.   RANGE OF MOTION - Extension, Prone Press Ups  Lie on your stomach on the floor, a bed will be too soft. Place your palms about shoulder width apart and at the height of your head.  Keeping your back as relaxed as possible, slowly straighten your elbows while keeping your hips on the floor. You may adjust the placement of your hands to maximize your comfort. As you gain motion, your hands will come more underneath your shoulders.  Hold this position 30 seconds.  Slowly return to lying flat on the floor. Repeat 2 times. Complete this exercise 3 times per week.   RANGE OF MOTION- Quadruped, Neutral Spine   Assume a hands and knees position on a firm surface. Keep your hands under your shoulders and your knees under your hips. You may place padding under your knees for comfort.  Drop your head and point your tailbone toward the ground below you. This will round out your lower back like an angry cat. Hold this position for 30 seconds.  Slowly lift your head and release your tail bone so that your back sags into a large arch, like an old horse.  Hold this position for 30 seconds.  Repeat this until you feel limber in your low back.  Now, find your "sweet spot." This will be the most comfortable position somewhere between the two previous positions. This is your neutral spine. Once you have found this position, tense your stomach muscles to support your low back.  Hold this position for 30 seconds. Repeat 2 times. Complete this exercise 3 times per week.   STRENGTHENING EXERCISES - Low Back Sprain These exercises may help you when beginning to rehabilitate your injury. These exercises should be done near your "sweet spot." This is the neutral, low-back arch, somewhere between fully rounded and fully arched, that is your least painful position. When performed in  this safe range of motion, these exercises can be used for people who have either a flexion or extension based injury. These exercises may resolve your symptoms with or without further involvement from your physician, physical therapist or athletic trainer. While completing these exercises, remember:   Muscles can gain both the endurance and the strength needed for everyday activities through controlled exercises.  Complete these exercises as instructed by your physician, physical therapist or athletic trainer. Increase the resistance and repetitions only as guided.  You may experience muscle soreness or fatigue, but the pain or discomfort you are trying to eliminate should never worsen during these exercises. If this pain does worsen, stop and make certain you are following the directions exactly. If the pain is still present after adjustments, discontinue the exercise until you can discuss the trouble with your caregiver.  STRENGTHENING -  Deep Abdominals, Pelvic Tilt   Lie on a firm bed or floor. Keeping your legs in front of you, bend your knees so they are both pointed toward the ceiling and your feet are flat on the floor.  Tense your lower abdominal muscles to press your low back into the floor. This motion will rotate your pelvis so that your tail bone is scooping upwards rather than pointing at your feet or into the floor. With a gentle tension and even breathing, hold this position for 3 seconds. Repeat 2 times. Complete this exercise 3 times per week.   STRENGTHENING - Abdominals, Crunches   Lie on a firm bed or floor. Keeping your legs in front of you, bend your knees so they are both pointed toward the ceiling and your feet are flat on the floor. Cross your arms over your chest.  Slightly tip your chin down without bending your neck.  Tense your abdominals and slowly lift your trunk high enough to just clear your shoulder blades. Lifting higher can put excessive stress on the lower  back and does not further strengthen your abdominal muscles.  Control your return to the starting position. Repeat 2 times. Complete this exercise 3 times per week.   STRENGTHENING - Quadruped, Opposite UE/LE Lift   Assume a hands and knees position on a firm surface. Keep your hands under your shoulders and your knees under your hips. You may place padding under your knees for comfort.  Find your neutral spine and gently tense your abdominal muscles so that you can maintain this position. Your shoulders and hips should form a rectangle that is parallel with the floor and is not twisted.  Keeping your trunk steady, lift your right hand no higher than your shoulder and then your left leg no higher than your hip. Make sure you are not holding your breath. Hold this position for 30 seconds.  Continuing to keep your abdominal muscles tense and your back steady, slowly return to your starting position. Repeat with the opposite arm and leg. Repeat 2 times. Complete this exercise 3 times per week.   STRENGTHENING - Abdominals and Quadriceps, Straight Leg Raise   Lie on a firm bed or floor with both legs extended in front of you.  Keeping one leg in contact with the floor, bend the other knee so that your foot can rest flat on the floor.  Find your neutral spine, and tense your abdominal muscles to maintain your spinal position throughout the exercise.  Slowly lift your straight leg off the floor about 6 inches for a count of 3, making sure to not hold your breath.  Still keeping your neutral spine, slowly lower your leg all the way to the floor. Repeat this exercise with each leg 2 times. Complete this exercise 3 times per week.  POSTURE AND BODY MECHANICS CONSIDERATIONS - Low Back Sprain Keeping correct posture when sitting, standing or completing your activities will reduce the stress put on different body tissues, allowing injured tissues a chance to heal and limiting painful experiences.  The following are general guidelines for improved posture.  While reading these guidelines, remember:  The exercises prescribed by your provider will help you have the flexibility and strength to maintain correct postures.  The correct posture provides the best environment for your joints to work. All of your joints have less wear and tear when properly supported by a spine with good posture. This means you will experience a healthier, less painful body.  Correct posture must be practiced with all of your activities, especially prolonged sitting and standing. Correct posture is as important when doing repetitive low-stress activities (typing) as it is when doing a single heavy-load activity (lifting).  RESTING POSITIONS Consider which positions are most painful for you when choosing a resting position. If you have pain with flexion-based activities (sitting, bending, stooping, squatting), choose a position that allows you to rest in a less flexed posture. You would want to avoid curling into a fetal position on your side. If your pain worsens with extension-based activities (prolonged standing, working overhead), avoid resting in an extended position such as sleeping on your stomach. Most people will find more comfort when they rest with their spine in a more neutral position, neither too rounded nor too arched. Lying on a non-sagging bed on your side with a pillow between your knees, or on your back with a pillow under your knees will often provide some relief. Keep in mind, being in any one position for a prolonged period of time, no matter how correct your posture, can still lead to stiffness.  PROPER SITTING POSTURE In order to minimize stress and discomfort on your spine, you must sit with correct posture. Sitting with good posture should be effortless for a healthy body. Returning to good posture is a gradual process. Many people can work toward this most comfortably by using various supports until  they have the flexibility and strength to maintain this posture on their own. When sitting with proper posture, your ears will fall over your shoulders and your shoulders will fall over your hips. You should use the back of the chair to support your upper back. Your lower back will be in a neutral position, just slightly arched. You may place a small pillow or folded towel at the base of your lower back for  support.  When working at a desk, create an environment that supports good, upright posture. Without extra support, muscles tire, which leads to excessive strain on joints and other tissues. Keep these recommendations in mind:  CHAIR:  A chair should be able to slide under your desk when your back makes contact with the back of the chair. This allows you to work closely.  The chair's height should allow your eyes to be level with the upper part of your monitor and your hands to be slightly lower than your elbows.  BODY POSITION  Your feet should make contact with the floor. If this is not possible, use a foot rest.  Keep your ears over your shoulders. This will reduce stress on your neck and low back.  INCORRECT SITTING POSTURES  If you are feeling tired and unable to assume a healthy sitting posture, do not slouch or slump. This puts excessive strain on your back tissues, causing more damage and pain. Healthier options include:  Using more support, like a lumbar pillow.  Switching tasks to something that requires you to be upright or walking.  Talking a brief walk.  Lying down to rest in a neutral-spine position.  PROLONGED STANDING WHILE SLIGHTLY LEANING FORWARD  When completing a task that requires you to lean forward while standing in one place for a long time, place either foot up on a stationary 2-4 inch high object to help maintain the best posture. When both feet are on the ground, the lower back tends to lose its slight inward curve. If this curve flattens (or becomes too  large), then the back and your other joints  will experience too much stress, tire more quickly, and can cause pain.  CORRECT STANDING POSTURES Proper standing posture should be assumed with all daily activities, even if they only take a few moments, like when brushing your teeth. As in sitting, your ears should fall over your shoulders and your shoulders should fall over your hips. You should keep a slight tension in your abdominal muscles to brace your spine. Your tailbone should point down to the ground, not behind your body, resulting in an over-extended swayback posture.   INCORRECT STANDING POSTURES  Common incorrect standing postures include a forward head, locked knees and/or an excessive swayback. WALKING Walk with an upright posture. Your ears, shoulders and hips should all line-up.  PROLONGED ACTIVITY IN A FLEXED POSITION When completing a task that requires you to bend forward at your waist or lean over a low surface, try to find a way to stabilize 3 out of 4 of your limbs. You can place a hand or elbow on your thigh or rest a knee on the surface you are reaching across. This will provide you more stability, so that your muscles do not tire as quickly. By keeping your knees relaxed, or slightly bent, you will also reduce stress across your lower back. CORRECT LIFTING TECHNIQUES  DO :  Assume a wide stance. This will provide you more stability and the opportunity to get as close as possible to the object which you are lifting.  Tense your abdominals to brace your spine. Bend at the knees and hips. Keeping your back locked in a neutral-spine position, lift using your leg muscles. Lift with your legs, keeping your back straight.  Test the weight of unknown objects before attempting to lift them.  Try to keep your elbows locked down at your sides in order get the best strength from your shoulders when carrying an object.     Always ask for help when lifting heavy or awkward  objects. INCORRECT LIFTING TECHNIQUES DO NOT:   Lock your knees when lifting, even if it is a small object.  Bend and twist. Pivot at your feet or move your feet when needing to change directions.  Assume that you can safely pick up even a paperclip without proper posture.
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
## 2017-05-02 NOTE — Progress Notes (Signed)
Pre visit review using our clinic review tool, if applicable. No additional management support is needed unless otherwise documented below in the visit note. 

## 2017-05-10 ENCOUNTER — Telehealth: Payer: Self-pay | Admitting: *Deleted

## 2017-05-10 NOTE — Telephone Encounter (Signed)
Received PT Plan of Care; forwarded to provider/SLS 02/28

## 2017-05-15 ENCOUNTER — Telehealth: Payer: Self-pay | Admitting: *Deleted

## 2017-05-15 NOTE — Telephone Encounter (Signed)
Received Physician Orders/Plan of Care from [facility]; forwarded to provider/SLS 03/05

## 2017-05-16 ENCOUNTER — Other Ambulatory Visit: Payer: Self-pay | Admitting: Family Medicine

## 2017-05-16 DIAGNOSIS — M1A0690 Idiopathic chronic gout, unspecified knee, without tophus (tophi): Secondary | ICD-10-CM

## 2017-06-20 MED FILL — ATORVASTATIN 40 MG TABLET: 40 | 30 days supply | Qty: 30 | Fill #1

## 2017-06-20 MED FILL — predniSONE 20 MG TABS: 20 | 5 days supply | Qty: 10 | Fill #0

## 2017-06-20 MED FILL — AMLODIPINE BESYLATE 5 MG TA: 5 | 30 days supply | Qty: 30 | Fill #2

## 2017-06-20 MED FILL — ALLOPURINOL 100 MG TABS: 100 | 30 days supply | Qty: 60 | Fill #0

## 2017-06-20 MED FILL — LISINOPRIL-HCTZ 20-25 MG TA: 20-25 | 30 days supply | Qty: 30 | Fill #2

## 2017-07-05 ENCOUNTER — Institutional Professional Consult (permissible substitution): Payer: 59 | Admitting: Pulmonary Disease

## 2017-07-12 ENCOUNTER — Ambulatory Visit: Payer: 59 | Admitting: Family Medicine

## 2017-07-16 ENCOUNTER — Encounter: Payer: Self-pay | Admitting: Family Medicine

## 2017-07-16 ENCOUNTER — Ambulatory Visit (INDEPENDENT_AMBULATORY_CARE_PROVIDER_SITE_OTHER): Payer: 59 | Admitting: Family Medicine

## 2017-07-16 VITALS — BP 122/80 | HR 89 | Temp 97.3°F | Ht 75.0 in | Wt 359.0 lb

## 2017-07-16 DIAGNOSIS — M1A0620 Idiopathic chronic gout, left knee, without tophus (tophi): Secondary | ICD-10-CM

## 2017-07-16 DIAGNOSIS — E669 Obesity, unspecified: Secondary | ICD-10-CM

## 2017-07-16 DIAGNOSIS — E1169 Type 2 diabetes mellitus with other specified complication: Secondary | ICD-10-CM | POA: Diagnosis not present

## 2017-07-16 MED ORDER — LOSARTAN POTASSIUM-HCTZ 100-25 MG PO TABS: 1.0000 | ORAL_TABLET | Freq: Every day | ORAL | 3 refills | Status: DC

## 2017-07-16 NOTE — Progress Notes (Signed)
Pre visit review using our clinic review tool, if applicable. No additional management support is needed unless otherwise documented below in the visit note. 

## 2017-07-16 NOTE — Patient Instructions (Addendum)
Stop the Prinzide. Hyzaar is replacing it.  Stay as active as possible  Keep the diet clean.  1-2 days to hear results of your labs.  If you do not hear anything about your referral in the next 1-2 weeks, call our office and ask for an update.  Let us know if you need anything.
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
## 2017-07-17 ENCOUNTER — Other Ambulatory Visit: Payer: 59

## 2017-09-06 MED FILL — ALLOPURINOL 100 MG TABLET: 100 | 30 days supply | Qty: 60 | Fill #1

## 2017-09-06 MED FILL — LOSARTAN-HCTZ 100-25 MG TAB: 100-25 | 30 days supply | Qty: 30 | Fill #0

## 2017-09-06 MED FILL — AMLODIPINE BESYLATE 5 MG TA: 5 | 30 days supply | Qty: 30 | Fill #3

## 2017-09-06 MED FILL — ATORVASTATIN 40 MG TABLET: 40 | 30 days supply | Qty: 30 | Fill #2

## 2017-12-12 ENCOUNTER — Other Ambulatory Visit (INDEPENDENT_AMBULATORY_CARE_PROVIDER_SITE_OTHER): Payer: 59

## 2017-12-12 ENCOUNTER — Encounter: Payer: Self-pay | Admitting: Family Medicine

## 2017-12-12 ENCOUNTER — Other Ambulatory Visit: Payer: Self-pay | Admitting: Family Medicine

## 2017-12-12 DIAGNOSIS — E669 Obesity, unspecified: Secondary | ICD-10-CM

## 2017-12-12 DIAGNOSIS — E1169 Type 2 diabetes mellitus with other specified complication: Secondary | ICD-10-CM

## 2017-12-12 DIAGNOSIS — I10 Essential (primary) hypertension: Secondary | ICD-10-CM

## 2017-12-12 DIAGNOSIS — G8929 Other chronic pain: Secondary | ICD-10-CM

## 2017-12-12 DIAGNOSIS — M25562 Pain in left knee: Principal | ICD-10-CM

## 2017-12-12 DIAGNOSIS — M25561 Pain in right knee: Principal | ICD-10-CM

## 2017-12-12 LAB — COMPREHENSIVE METABOLIC PANEL
ALK PHOS: 44 U/L (ref 39–117)
ALT: 13 U/L (ref 0–53)
AST: 15 U/L (ref 0–37)
Albumin: 3.9 g/dL (ref 3.5–5.2)
BILIRUBIN TOTAL: 0.6 mg/dL (ref 0.2–1.2)
BUN: 7 mg/dL (ref 6–23)
CALCIUM: 9.1 mg/dL (ref 8.4–10.5)
CO2: 26 mEq/L (ref 19–32)
Chloride: 103 mEq/L (ref 96–112)
Creatinine, Ser: 0.69 mg/dL (ref 0.40–1.50)
GFR: 158.04 mL/min (ref >60.00)
GLUCOSE: 104 mg/dL — AB (ref 70–99)
Potassium: 3.9 mEq/L (ref 3.5–5.1)
Sodium: 137 mEq/L (ref 135–145)
TOTAL PROTEIN: 7.2 g/dL (ref 6.0–8.3)

## 2017-12-12 LAB — URIC ACID: Uric Acid, Serum: 5.5 mg/dL (ref 4.0–7.8)

## 2017-12-12 LAB — HEMOGLOBIN A1C: Hgb A1c MFr Bld: 5.8 % (ref 4.6–6.5)

## 2017-12-12 MED ORDER — NAPROXEN 500 MG PO TABS: 500.0000 mg | ORAL_TABLET | Freq: Two times a day (BID) | ORAL | 1 refills | Status: DC | PRN

## 2017-12-12 MED ORDER — LOSARTAN POTASSIUM-HCTZ 100-25 MG PO TABS: 1.0000 | ORAL_TABLET | Freq: Every day | ORAL | 3 refills | Status: DC

## 2017-12-12 MED ORDER — ATORVASTATIN CALCIUM 40 MG PO TABS: 40.0000 mg | ORAL_TABLET | Freq: Every day | ORAL | 3 refills | Status: DC

## 2017-12-12 MED ORDER — ALLOPURINOL 100 MG PO TABS: 200.0000 mg | ORAL_TABLET | Freq: Every day | ORAL | 3 refills | Status: DC

## 2017-12-12 MED ORDER — AMLODIPINE BESYLATE 5 MG PO TABS: 5.0000 mg | ORAL_TABLET | Freq: Every day | ORAL | 3 refills | Status: DC

## 2017-12-12 MED FILL — ATORVASTATIN 40 MG TABLET: 40 | 30 days supply | Qty: 30 | Fill #0

## 2017-12-12 MED FILL — NAPROXEN 500 MG TABLET: 500 | 15 days supply | Qty: 30 | Fill #0

## 2017-12-12 MED FILL — LOSARTAN-HCTZ 100-25 MG TAB: 100-25 | 30 days supply | Qty: 30 | Fill #0

## 2017-12-12 MED FILL — AMLODIPINE BESYLATE 5 MG TA: 5 | 30 days supply | Qty: 30 | Fill #0

## 2017-12-12 MED FILL — ALLOPURINOL 100 MG TABLET: 100 | 30 days supply | Qty: 60 | Fill #0

## 2017-12-18 ENCOUNTER — Telehealth: Payer: Self-pay | Admitting: Family Medicine

## 2017-12-18 NOTE — Telephone Encounter (Signed)
Copied from [place] [id number]. Topic: Quick Communication - See Telephone Encounter >> Dec 18, 2017  2:09 PM Rosalin Hawking wrote: CRM for notification. See Telephone encounter for: 12/18/17.   Pt came in office stating saw provider on 12-12-2017 for some leg issue, pt got a letter to excuse him from work on 12-12-2017 from provider but pt stated was still not feeling well and did not go to work on 12-13-2017 (Thursday)  and 12-14-2017 (Friday). Pt states is needing a new excuse letter for work for the days of 12-12-2017, 12-13-2017 and 12-14-2017. Please advise.

## 2017-12-19 NOTE — Telephone Encounter (Signed)
Pt would like work note to be fax to his job attn [employer] [phone]

## 2017-12-19 NOTE — Telephone Encounter (Signed)
Will do let me know dates

## 2017-12-19 NOTE — Telephone Encounter (Signed)
He did not see [place] on 10/2? Thus I would have a hard time writing a letter for something I did not evaluate him for. Is he confusing [place] with someone else?

## 2017-12-20 NOTE — Telephone Encounter (Signed)
Called the patient and he explained he did have labs for gout done on 10/2--he was told by someone in the office to stay off his feet.  I informed of PCP instructions regarding him not being seen by PCP and to keep appt on 12/21/17 at 2:30 and can discuss all--he verbalized understanding/agreement.

## 2017-12-21 ENCOUNTER — Encounter: Payer: Self-pay | Admitting: Family Medicine

## 2017-12-21 ENCOUNTER — Ambulatory Visit (INDEPENDENT_AMBULATORY_CARE_PROVIDER_SITE_OTHER): Payer: 59 | Admitting: Family Medicine

## 2017-12-21 VITALS — BP 142/82 | HR 92 | Temp 97.6°F | Ht 75.0 in | Wt 383.0 lb

## 2017-12-21 DIAGNOSIS — M79671 Pain in right foot: Secondary | ICD-10-CM

## 2017-12-21 DIAGNOSIS — M25562 Pain in left knee: Secondary | ICD-10-CM | POA: Diagnosis not present

## 2017-12-21 DIAGNOSIS — M79672 Pain in left foot: Secondary | ICD-10-CM | POA: Diagnosis not present

## 2017-12-21 DIAGNOSIS — M25561 Pain in right knee: Secondary | ICD-10-CM | POA: Diagnosis not present

## 2017-12-21 DIAGNOSIS — G8929 Other chronic pain: Secondary | ICD-10-CM

## 2017-12-21 MED ORDER — METHYLPREDNISOLONE ACETATE 40 MG/ML IJ SUSP
40.0000 mg | Freq: Once | INTRAMUSCULAR | Status: AC
  Administered 2017-12-21: 40 mg via INTRA_ARTICULAR

## 2017-12-21 NOTE — Progress Notes (Signed)
Pre visit review using our clinic review tool, if applicable. No additional management support is needed unless otherwise documented below in the visit note. 
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
## 2017-12-21 NOTE — Patient Instructions (Addendum)
It was good seeing you today.  Give the steroid 7-8 days to fully work.   Try to keep active.   Ice/cold pack over area for 10-15 min twice daily.  OK to take Tylenol 1000 mg (2 extra strength tabs) or 975 mg (3 regular strength tabs) every 6 hours as needed.  Continue naproxen.   If you do not hear anything about your referral in the next 1-2 weeks, call our office and ask for an update.   Let us know if you need anything.

## 2017-12-21 NOTE — Addendum Note (Signed)
Addended by: Sharon Seller B on: 12/21/2017 03:22 PM   Modules accepted: Orders

## 2017-12-27 ENCOUNTER — Ambulatory Visit: Payer: 59 | Admitting: Family Medicine

## 2017-12-31 ENCOUNTER — Ambulatory Visit: Payer: 59 | Admitting: Family Medicine

## 2018-02-01 MED FILL — ATORVASTATIN 40 MG TABLET: 40 | 30 days supply | Qty: 30 | Fill #1

## 2018-02-01 MED FILL — AMLODIPINE BESYLATE 5 MG TA: 5 | 30 days supply | Qty: 30 | Fill #1

## 2018-02-01 MED FILL — LOSARTAN-HCTZ 100-25 MG TAB: 100-25 | 30 days supply | Qty: 30 | Fill #1

## 2018-02-01 MED FILL — NAPROXEN 500 MG TABLET: 500 | 15 days supply | Qty: 30 | Fill #1

## 2018-02-01 MED FILL — ALLOPURINOL 100 MG TABLET: 100 | 30 days supply | Qty: 60 | Fill #1

## 2018-04-18 ENCOUNTER — Encounter: Payer: Self-pay | Admitting: Medical

## 2018-04-18 ENCOUNTER — Ambulatory Visit (INDEPENDENT_AMBULATORY_CARE_PROVIDER_SITE_OTHER): Payer: 59 | Admitting: Medical

## 2018-04-18 VITALS — BP 130/66 | HR 85 | Temp 98.2°F | Resp 16 | Ht 75.0 in | Wt 308.0 lb

## 2018-04-18 DIAGNOSIS — J069 Acute upper respiratory infection, unspecified: Secondary | ICD-10-CM

## 2018-04-18 DIAGNOSIS — R05 Cough: Secondary | ICD-10-CM | POA: Diagnosis not present

## 2018-04-18 DIAGNOSIS — R059 Cough, unspecified: Secondary | ICD-10-CM

## 2018-04-18 DIAGNOSIS — I10 Essential (primary) hypertension: Secondary | ICD-10-CM

## 2018-04-18 LAB — POCT INFLUENZA A/B
Influenza A, POC: NEGATIVE
Influenza B, POC: NEGATIVE

## 2018-04-18 MED ORDER — LOSARTAN POTASSIUM-HCTZ 100-25 MG PO TABS: 1.0000 | ORAL_TABLET | Freq: Every day | ORAL | 3 refills | Status: DC

## 2018-04-18 MED ORDER — BENZONATATE 100 MG PO CAPS: 100.0000 mg | ORAL_CAPSULE | Freq: Three times a day (TID) | ORAL | 0 refills | Status: DC | PRN

## 2018-04-18 MED ORDER — AMLODIPINE BESYLATE 5 MG PO TABS: 5.0000 mg | ORAL_TABLET | Freq: Every day | ORAL | 3 refills | Status: DC

## 2018-04-18 MED ORDER — FLUTICASONE PROPIONATE 50 MCG/ACT NA SUSP: 2.0000 | Freq: Every day | NASAL | 1 refills | Status: DC

## 2018-04-18 MED ORDER — ALLOPURINOL 100 MG PO TABS: 200.0000 mg | ORAL_TABLET | Freq: Every day | ORAL | 3 refills | Status: DC
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
## 2018-04-18 NOTE — Patient Instructions (Signed)
You do appear to have early upper respiratory infection type signs and symptoms.  On exam no bacterial source of infection found.  Will treat conservatively.  Recommend rest, hydration, benzonatate for cough and Flonase for nasal congestion.  If you get any bacterial signs/symptoms of infection as discussed then try to MyChart [place] or call.  In that event would give antibiotic.  For caution sake, we did rapid flu test today.  You need to pick up your son but will notify you if positive result.  I have your cell phone number.  Refilled your chronic meds today.  Blood pressure well controlled.  Follow-up in 7 to 10 days or as needed.  Once you feel completely better from above illness recommend that you call sometime next week and get flu vaccine with nurse visit.

## 2018-04-22 ENCOUNTER — Ambulatory Visit: Payer: 59 | Admitting: Family Medicine

## 2018-04-29 ENCOUNTER — Encounter: Payer: Self-pay | Admitting: Family Medicine

## 2018-04-29 ENCOUNTER — Ambulatory Visit (INDEPENDENT_AMBULATORY_CARE_PROVIDER_SITE_OTHER): Payer: 59 | Admitting: Family Medicine

## 2018-04-29 VITALS — BP 181/100 | HR 83 | Ht 75.0 in | Wt 308.0 lb

## 2018-04-29 DIAGNOSIS — G629 Polyneuropathy, unspecified: Secondary | ICD-10-CM

## 2018-04-29 DIAGNOSIS — Z6838 Body mass index (BMI) 38.0-38.9, adult: Secondary | ICD-10-CM

## 2018-04-29 DIAGNOSIS — G8929 Other chronic pain: Secondary | ICD-10-CM | POA: Diagnosis not present

## 2018-04-29 DIAGNOSIS — M25561 Pain in right knee: Secondary | ICD-10-CM

## 2018-04-29 DIAGNOSIS — M25562 Pain in left knee: Secondary | ICD-10-CM | POA: Diagnosis not present

## 2018-04-29 DIAGNOSIS — M25571 Pain in right ankle and joints of right foot: Secondary | ICD-10-CM | POA: Diagnosis not present

## 2018-04-29 DIAGNOSIS — E669 Obesity, unspecified: Secondary | ICD-10-CM

## 2018-04-29 MED ORDER — METHYLPREDNISOLONE ACETATE 40 MG/ML IJ SUSP
40.0000 mg | Freq: Once | INTRAMUSCULAR | Status: AC
  Administered 2018-04-29: 40 mg via INTRA_ARTICULAR
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
## 2018-04-29 NOTE — Patient Instructions (Signed)
Get your B12 level checked upstairs as you leave today. Get x-rays downstairs also of your right ankle. Your pain is due to arthritis. These are the different medications you can take for this: Tylenol 500mg  1-2 tabs three times a day for pain. Capsaicin, aspercreme, or biofreeze topically up to four times a day may also help with pain. Some supplements that may help for arthritis: Boswellia extract, curcumin, pycnogenol Aleve 1-2 tabs twice a day with food Cortisone injections are an option - you were given these today. If cortisone injections do not help, there are different types of shots that may help but they take longer to take effect. It's important that you continue to stay active. Straight leg raises, knee extensions 3 sets of 10 once a day (add ankle weight if these become too easy). Consider physical therapy to strengthen muscles around the joint that hurts to take pressure off of the joint itself. Shoe inserts with good arch support may be helpful. Heat or ice 15 minutes at a time 3-4 times a day as needed to help with pain. Water aerobics and cycling with low resistance are the best two types of exercise for arthritis though any exercise is ok as long as it doesn't worsen the pain. Follow up with me in 6 weeks.

## 2018-04-30 ENCOUNTER — Encounter: Payer: Self-pay | Admitting: Family Medicine

## 2018-05-06 ENCOUNTER — Ambulatory Visit: Payer: 59 | Attending: Family Medicine | Admitting: Physical Therapy

## 2018-05-11 IMAGING — US US EXTREM LOW VENOUS BILAT
1 series · 13 of 24 positions shown · non-contrast
Comparison: None.

CLINICAL DATA: 46-year-old male with a history of leg swelling



[Series 1: us extrem low venous bilat · 0.11mm/px · 13 of 49 slices shown]
[im 1/49]
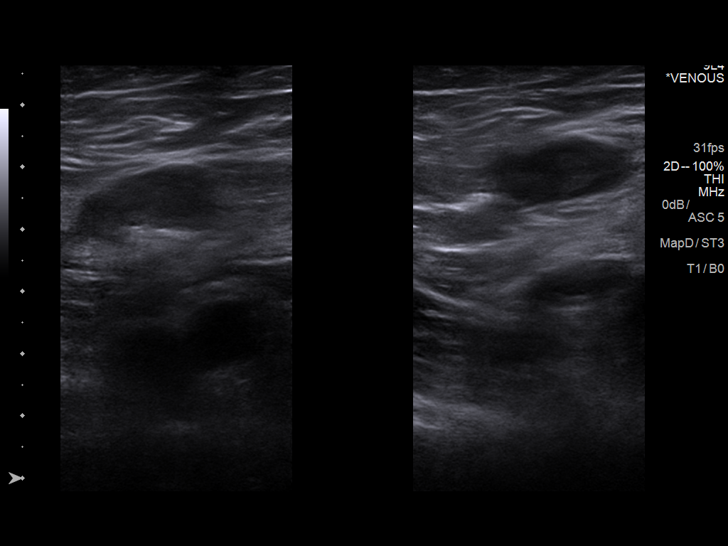
[im 5/49]
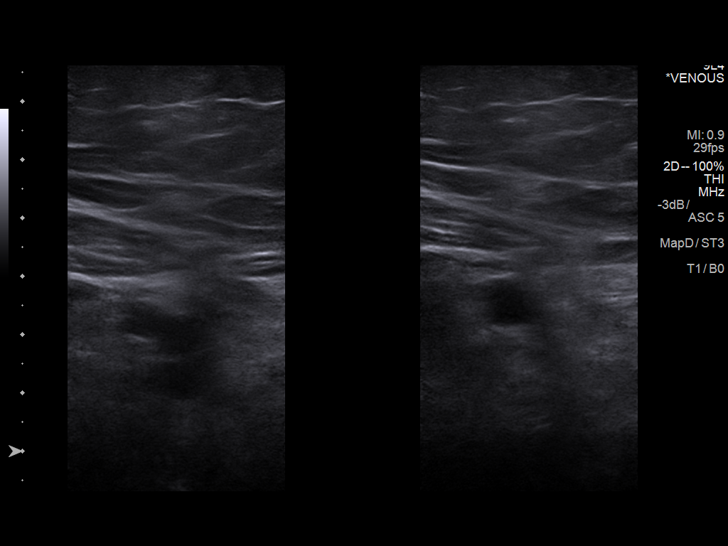
[im 9/49]
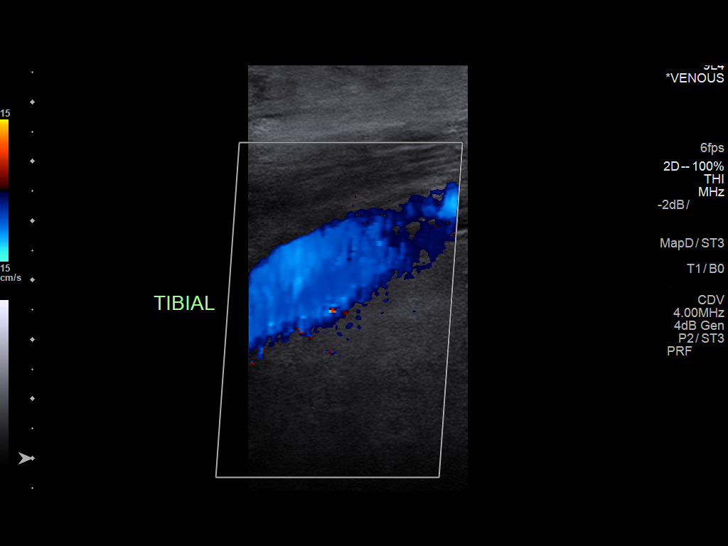
[im 13/49]
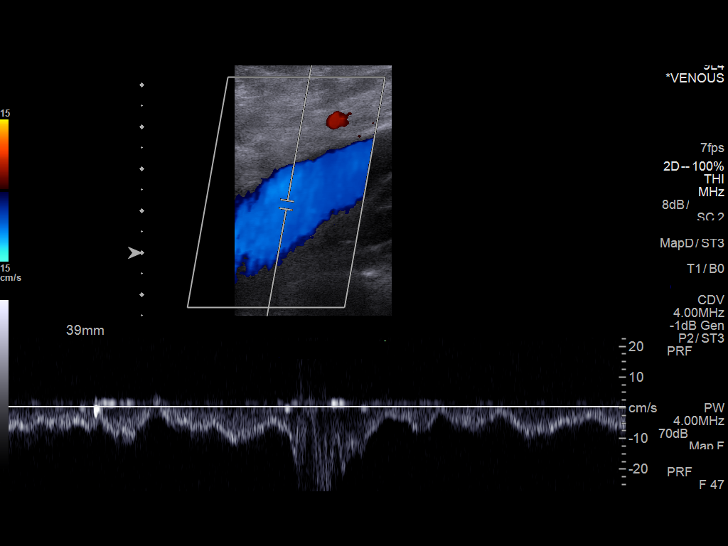
[im 17/49]
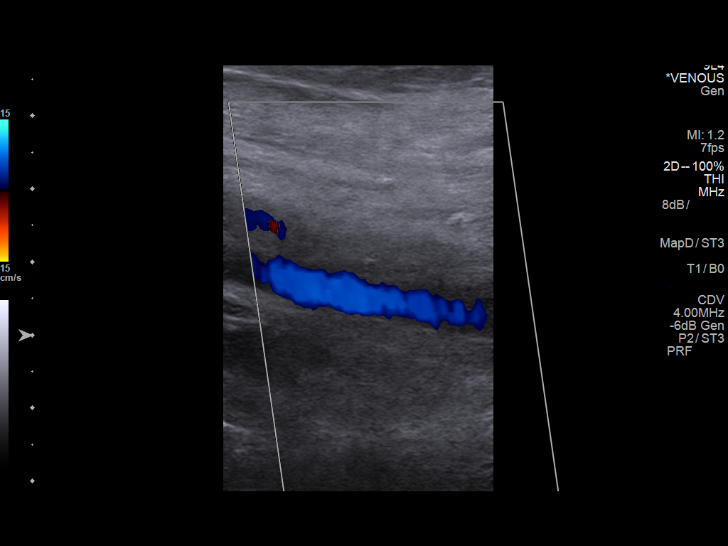
[im 21/49]
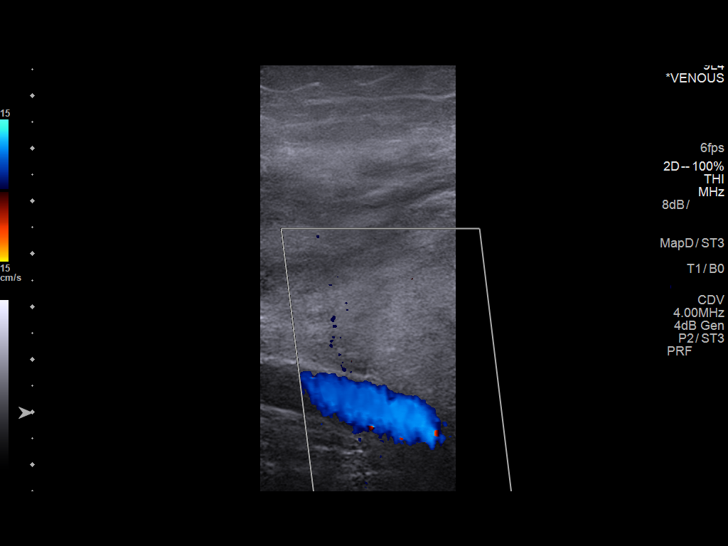
[im 26/49]
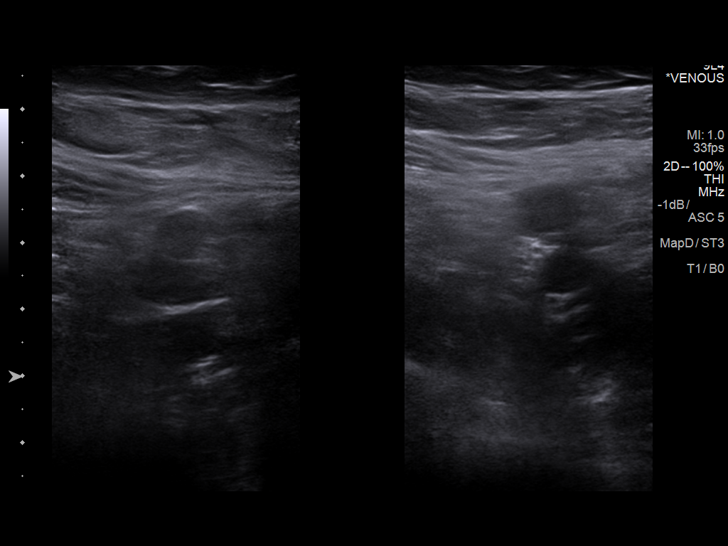
[im 28/49]
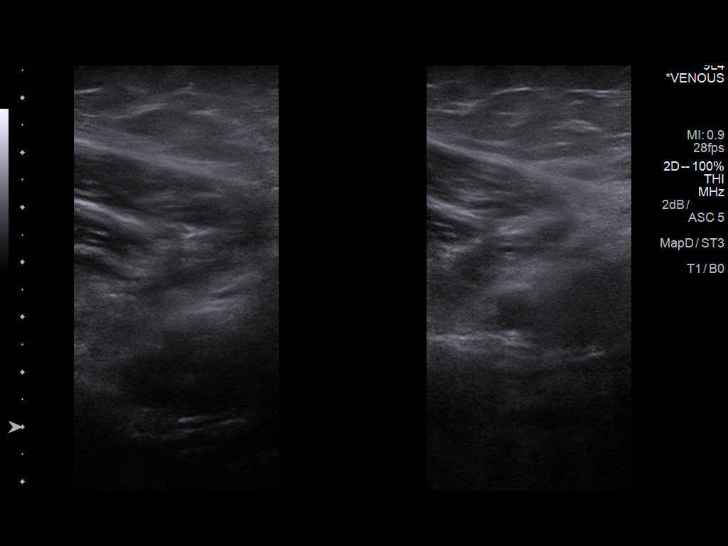
[im 32/49]
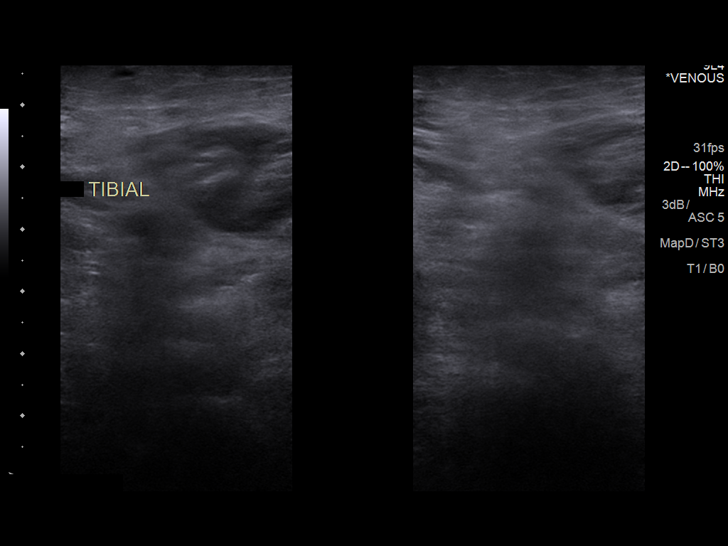
[im 36/49]
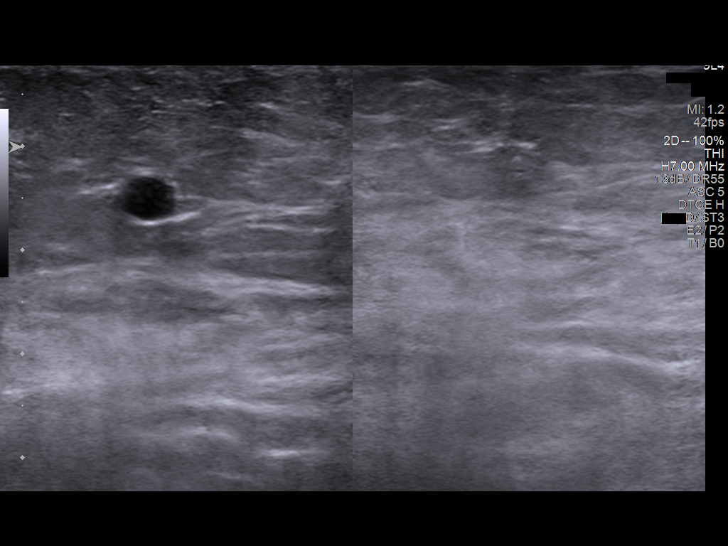
[im 40/49]
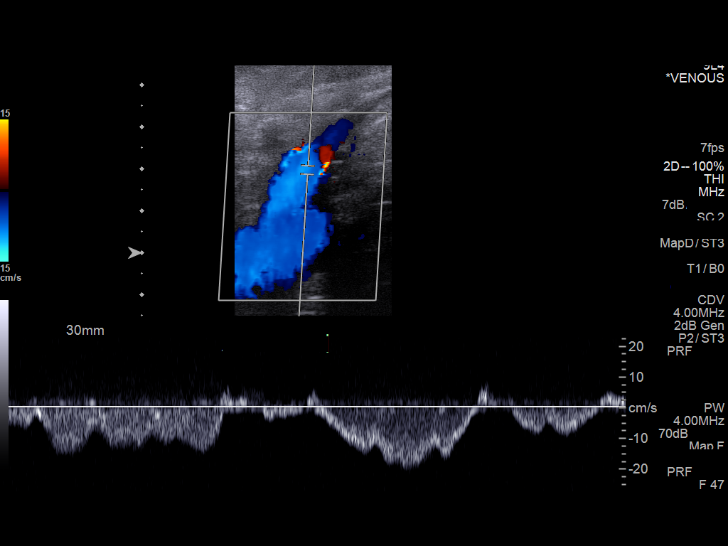
[im 44/49]
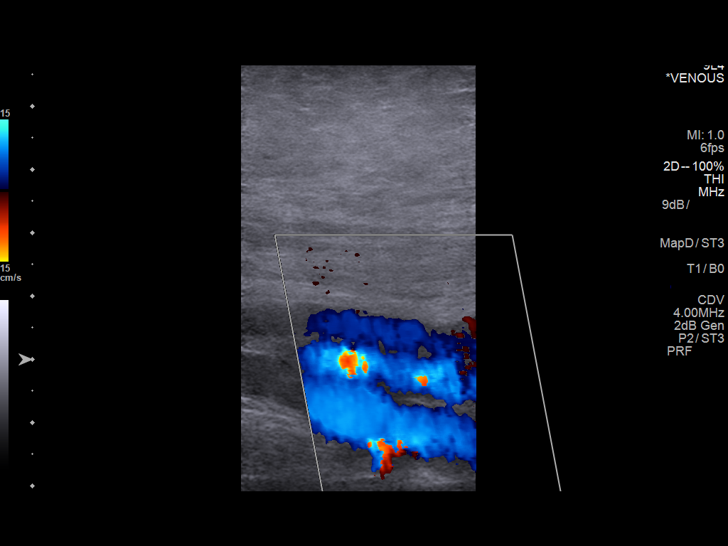
[im 49/49]
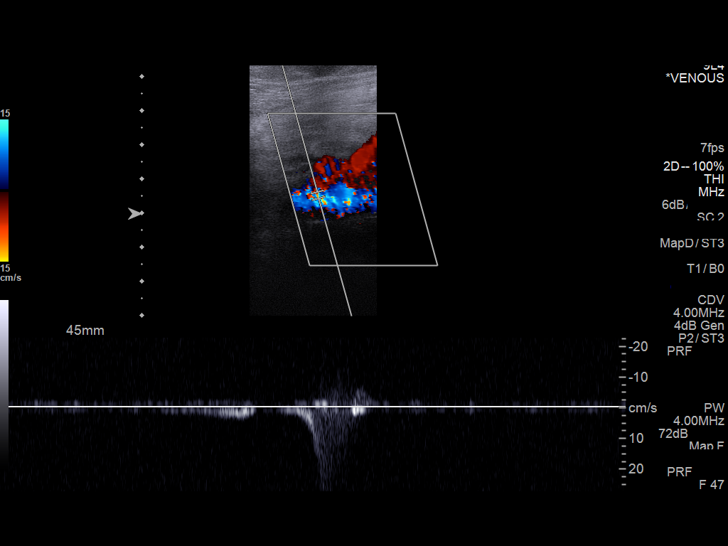

[13 of 24 positions shown; findings below may reference images not displayed]

FINDINGS: RIGHT LOWER EXTREMITY

Common Femoral Vein: No evidence of thrombus. Normal
compressibility, respiratory phasicity and response to augmentation.

Saphenofemoral Junction: No evidence of thrombus. Normal
compressibility and flow on color Doppler imaging.

Profunda Femoral Vein: No evidence of thrombus. Normal
compressibility and flow on color Doppler imaging.

Femoral Vein: No evidence of thrombus. Normal compressibility,
respiratory phasicity and response to augmentation.

Popliteal Vein: No evidence of thrombus. Normal compressibility,
respiratory phasicity and response to augmentation.

Calf Veins: No evidence of thrombus. Normal compressibility and flow
on color Doppler imaging.

Superficial Great Saphenous Vein: No evidence of thrombus. Normal
compressibility and flow on color Doppler imaging.

Other Findings:  Edema at the right ankle

LEFT LOWER EXTREMITY

Common Femoral Vein: No evidence of thrombus. Normal
compressibility, respiratory phasicity and response to augmentation.

Saphenofemoral Junction: No evidence of thrombus. Normal
compressibility and flow on color Doppler imaging.

Profunda Femoral Vein: No evidence of thrombus. Normal
compressibility and flow on color Doppler imaging.

Femoral Vein: No evidence of thrombus. Normal compressibility,
respiratory phasicity and response to augmentation.

Popliteal Vein: No evidence of thrombus. Normal compressibility,
respiratory phasicity and response to augmentation.

Calf Veins: No evidence of thrombus. Normal compressibility and flow
on color Doppler imaging.

Superficial Great Saphenous Vein: No evidence of thrombus. Normal
compressibility and flow on color Doppler imaging.

Other Findings:  Edema at the left ankle
IMPRESSION: Sonographic survey of the bilateral lower extremities negative for
DVT.

Edema of the bilateral ankles

## 2018-06-10 ENCOUNTER — Ambulatory Visit: Payer: 59 | Admitting: Family Medicine

## 2018-07-09 ENCOUNTER — Other Ambulatory Visit: Payer: Self-pay | Admitting: Family Medicine

## 2018-07-09 DIAGNOSIS — M25562 Pain in left knee: Principal | ICD-10-CM

## 2018-07-09 DIAGNOSIS — M25561 Pain in right knee: Principal | ICD-10-CM

## 2018-07-09 DIAGNOSIS — G8929 Other chronic pain: Secondary | ICD-10-CM

## 2018-07-10 ENCOUNTER — Ambulatory Visit (INDEPENDENT_AMBULATORY_CARE_PROVIDER_SITE_OTHER): Payer: 59 | Admitting: Family Medicine

## 2018-07-10 ENCOUNTER — Other Ambulatory Visit: Payer: Self-pay

## 2018-07-10 ENCOUNTER — Encounter: Payer: Self-pay | Admitting: Family Medicine

## 2018-07-10 DIAGNOSIS — G629 Polyneuropathy, unspecified: Secondary | ICD-10-CM | POA: Insufficient documentation

## 2018-07-10 DIAGNOSIS — I10 Essential (primary) hypertension: Secondary | ICD-10-CM | POA: Diagnosis not present

## 2018-07-10 DIAGNOSIS — G8929 Other chronic pain: Secondary | ICD-10-CM

## 2018-07-10 DIAGNOSIS — E1169 Type 2 diabetes mellitus with other specified complication: Secondary | ICD-10-CM

## 2018-07-10 DIAGNOSIS — M25561 Pain in right knee: Secondary | ICD-10-CM | POA: Diagnosis not present

## 2018-07-10 DIAGNOSIS — M25562 Pain in left knee: Secondary | ICD-10-CM

## 2018-07-10 DIAGNOSIS — E669 Obesity, unspecified: Secondary | ICD-10-CM

## 2018-07-10 DIAGNOSIS — M1A0690 Idiopathic chronic gout, unspecified knee, without tophus (tophi): Secondary | ICD-10-CM

## 2018-07-10 MED ORDER — AMLODIPINE BESYLATE 5 MG PO TABS: 5.0000 mg | ORAL_TABLET | Freq: Every day | ORAL | 3 refills | Status: DC

## 2018-07-10 MED ORDER — ATORVASTATIN CALCIUM 40 MG PO TABS: 40.0000 mg | ORAL_TABLET | Freq: Every day | ORAL | 3 refills | Status: DC

## 2018-07-10 MED ORDER — NAPROXEN 500 MG PO TABS: 500.0000 mg | ORAL_TABLET | Freq: Two times a day (BID) | ORAL | 1 refills | Status: DC | PRN

## 2018-07-10 MED ORDER — ALLOPURINOL 100 MG PO TABS: 200.0000 mg | ORAL_TABLET | Freq: Every day | ORAL | 3 refills | Status: DC

## 2018-07-10 MED ORDER — GABAPENTIN 300 MG PO CAPS: 300.0000 mg | ORAL_CAPSULE | Freq: Three times a day (TID) | ORAL | 1 refills | Status: DC

## 2018-07-10 MED ORDER — LOSARTAN POTASSIUM-HCTZ 100-25 MG PO TABS: 1.0000 | ORAL_TABLET | Freq: Every day | ORAL | 3 refills | Status: DC

## 2018-07-10 MED FILL — NAPROXEN 500 MG TABLET: 500 | 15 days supply | Qty: 30 | Fill #0
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
## 2018-07-11 ENCOUNTER — Other Ambulatory Visit: Payer: Self-pay | Admitting: Medical

## 2018-07-11 DIAGNOSIS — M1A0690 Idiopathic chronic gout, unspecified knee, without tophus (tophi): Secondary | ICD-10-CM

## 2018-11-19 ENCOUNTER — Ambulatory Visit: Payer: 59 | Admitting: Family Medicine

## 2018-11-28 ENCOUNTER — Other Ambulatory Visit: Payer: Self-pay

## 2018-11-28 ENCOUNTER — Encounter: Payer: Self-pay | Admitting: Family Medicine

## 2018-11-28 ENCOUNTER — Ambulatory Visit (INDEPENDENT_AMBULATORY_CARE_PROVIDER_SITE_OTHER): Payer: 59 | Admitting: Family Medicine

## 2018-11-28 DIAGNOSIS — G629 Polyneuropathy, unspecified: Secondary | ICD-10-CM

## 2018-11-28 DIAGNOSIS — M545 Low back pain, unspecified: Secondary | ICD-10-CM

## 2018-11-28 DIAGNOSIS — R0681 Apnea, not elsewhere classified: Secondary | ICD-10-CM

## 2018-11-28 DIAGNOSIS — M25561 Pain in right knee: Secondary | ICD-10-CM | POA: Diagnosis not present

## 2018-11-28 DIAGNOSIS — M1A0690 Idiopathic chronic gout, unspecified knee, without tophus (tophi): Secondary | ICD-10-CM

## 2018-11-28 DIAGNOSIS — M25562 Pain in left knee: Secondary | ICD-10-CM

## 2018-11-28 DIAGNOSIS — G8929 Other chronic pain: Secondary | ICD-10-CM

## 2018-11-28 MED ORDER — COLCHICINE 0.6 MG PO CAPS: 0.6000 mg | ORAL_CAPSULE | Freq: Every day | ORAL | 5 refills | Status: DC
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
## 2018-12-04 ENCOUNTER — Ambulatory Visit: Payer: 59 | Admitting: Family Medicine

## 2018-12-04 DIAGNOSIS — Z0289 Encounter for other administrative examinations: Secondary | ICD-10-CM

## 2018-12-05 ENCOUNTER — Ambulatory Visit: Payer: 59 | Attending: Family Medicine | Admitting: Physical Therapy

## 2018-12-12 ENCOUNTER — Ambulatory Visit: Payer: 59 | Admitting: Physical Therapy

## 2019-01-14 ENCOUNTER — Institutional Professional Consult (permissible substitution): Payer: 59 | Admitting: Pulmonary Disease

## 2019-03-12 ENCOUNTER — Encounter: Payer: Self-pay | Admitting: Family Medicine

## 2019-03-12 ENCOUNTER — Ambulatory Visit (INDEPENDENT_AMBULATORY_CARE_PROVIDER_SITE_OTHER): Payer: Managed Care, Other (non HMO) | Admitting: Family Medicine

## 2019-03-12 ENCOUNTER — Other Ambulatory Visit: Payer: Self-pay

## 2019-03-12 DIAGNOSIS — M545 Low back pain, unspecified: Secondary | ICD-10-CM | POA: Insufficient documentation

## 2019-03-12 DIAGNOSIS — I10 Essential (primary) hypertension: Secondary | ICD-10-CM | POA: Diagnosis not present

## 2019-03-12 DIAGNOSIS — M7989 Other specified soft tissue disorders: Secondary | ICD-10-CM

## 2019-03-12 DIAGNOSIS — G8929 Other chronic pain: Secondary | ICD-10-CM
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
## 2019-04-15 ENCOUNTER — Other Ambulatory Visit: Payer: Self-pay

## 2019-04-16 ENCOUNTER — Ambulatory Visit: Payer: Self-pay | Admitting: Family Medicine

## 2019-04-16 ENCOUNTER — Other Ambulatory Visit: Payer: Self-pay

## 2019-05-02 ENCOUNTER — Other Ambulatory Visit: Payer: Self-pay | Admitting: Family Medicine

## 2019-06-24 ENCOUNTER — Institutional Professional Consult (permissible substitution): Payer: Self-pay | Admitting: Internal Medicine

## 2019-08-19 ENCOUNTER — Ambulatory Visit (INDEPENDENT_AMBULATORY_CARE_PROVIDER_SITE_OTHER): Payer: No Typology Code available for payment source | Admitting: Family Medicine

## 2019-08-19 ENCOUNTER — Encounter: Payer: Self-pay | Admitting: Family Medicine

## 2019-08-19 VITALS — BP 140/100 | HR 99 | Temp 95.8°F | Ht 75.0 in | Wt 375.0 lb

## 2019-08-19 DIAGNOSIS — M1A0690 Idiopathic chronic gout, unspecified knee, without tophus (tophi): Secondary | ICD-10-CM | POA: Diagnosis not present

## 2019-08-19 DIAGNOSIS — E1169 Type 2 diabetes mellitus with other specified complication: Secondary | ICD-10-CM

## 2019-08-19 DIAGNOSIS — M7989 Other specified soft tissue disorders: Secondary | ICD-10-CM | POA: Diagnosis not present

## 2019-08-19 DIAGNOSIS — E669 Obesity, unspecified: Secondary | ICD-10-CM

## 2019-08-19 DIAGNOSIS — I10 Essential (primary) hypertension: Secondary | ICD-10-CM | POA: Diagnosis not present

## 2019-08-19 LAB — COMPREHENSIVE METABOLIC PANEL
ALT: 16 U/L (ref 0–53)
AST: 14 U/L (ref 0–37)
Albumin: 3.8 g/dL (ref 3.5–5.2)
Alkaline Phosphatase: 36 U/L — ABNORMAL LOW (ref 39–117)
BUN: 9 mg/dL (ref 6–23)
CO2: 36 mEq/L — ABNORMAL HIGH (ref 19–32)
Calcium: 9.5 mg/dL (ref 8.4–10.5)
Chloride: 97 mEq/L (ref 96–112)
Creatinine, Ser: 0.68 mg/dL (ref 0.40–1.50)
GFR: 150.15 mL/min (ref >60.00)
Glucose, Bld: 102 mg/dL — ABNORMAL HIGH (ref 70–99)
Potassium: 3.4 mEq/L — ABNORMAL LOW (ref 3.5–5.1)
Sodium: 138 mEq/L (ref 135–145)
Total Bilirubin: 0.8 mg/dL (ref 0.2–1.2)
Total Protein: 7 g/dL (ref 6.0–8.3)

## 2019-08-19 LAB — LIPID PANEL
Cholesterol: 108 mg/dL (ref 0–200)
HDL: 29.5 mg/dL — ABNORMAL LOW (ref >39.00)
LDL Cholesterol: 55 mg/dL (ref 0–99)
NonHDL: 78.53
Total CHOL/HDL Ratio: 4
Triglycerides: 117 mg/dL (ref 0.0–149.0)
VLDL: 23.4 mg/dL (ref 0.0–40.0)

## 2019-08-19 LAB — URIC ACID: Uric Acid, Serum: 4.9 mg/dL (ref 4.0–7.8)

## 2019-08-19 LAB — HEMOGLOBIN A1C: Hgb A1c MFr Bld: 6.8 % — ABNORMAL HIGH (ref 4.6–6.5)

## 2019-08-19 MED ORDER — ACCU-CHEK AVIVA PLUS VI STRP: ORAL_STRIP | 3 refills | Status: DC

## 2019-08-19 MED ORDER — ATORVASTATIN CALCIUM 40 MG PO TABS: 40.0000 mg | ORAL_TABLET | Freq: Every day | ORAL | 3 refills | Status: DC

## 2019-08-19 MED ORDER — LOSARTAN POTASSIUM-HCTZ 100-25 MG PO TABS: 1.0000 | ORAL_TABLET | Freq: Every day | ORAL | 3 refills | Status: DC

## 2019-08-19 MED ORDER — ACCU-CHEK SOFTCLIX LANCETS MISC: 3 refills | Status: DC

## 2019-08-19 NOTE — Addendum Note (Signed)
Addended by: Sharon Seller B on: 08/19/2019 01:05 PM   Modules accepted: Orders
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
## 2019-08-19 NOTE — Patient Instructions (Addendum)
Give [place] 2-3 business days to get the results of your labs back.   Keep the diet clean and stay active.  For the swelling in your lower extremities, be sure to elevate your legs when able, mind the salt intake, stay physically active and consider wearing compression stockings.  If you do not hear anything about your referral in the next 1-2 weeks, call our office and ask for an update.  Check your sugars around 2-3 times per week. Alternate checking in the morning before you eat, in the afternoon and before bed. Write them down and bring it to your next appointment.    Let us know if you need anything.

## 2019-08-20 ENCOUNTER — Other Ambulatory Visit: Payer: Self-pay | Admitting: Family Medicine

## 2019-08-20 ENCOUNTER — Telehealth: Payer: Self-pay | Admitting: Family Medicine

## 2019-08-20 DIAGNOSIS — E876 Hypokalemia: Secondary | ICD-10-CM

## 2019-08-20 LAB — MICROALBUMIN / CREATININE URINE RATIO
Creatinine,U: 71.1 mg/dL
Microalb Creat Ratio: 1.4 mg/g (ref 0.0–30.0)
Microalb, Ur: 1 mg/dL (ref 0.0–1.9)

## 2019-08-20 NOTE — Telephone Encounter (Signed)
See lab results.  

## 2019-08-20 NOTE — Telephone Encounter (Signed)
Patient calling back for lab results °

## 2019-08-26 LAB — BASIC METABOLIC PANEL
BUN/Creatinine Ratio: 13 (ref 9–20)
BUN: 8 mg/dL (ref 6–24)
CO2: 22 mmol/L (ref 20–29)
Calcium: 9.2 mg/dL (ref 8.7–10.2)
Chloride: 98 mmol/L (ref 96–106)
Creatinine, Ser: 0.63 mg/dL — ABNORMAL LOW (ref 0.76–1.27)
GFR calc Af Amer: 135 mL/min/{1.73_m2} (ref >59)
GFR calc non Af Amer: 117 mL/min/{1.73_m2} (ref >59)
Glucose: 134 mg/dL — ABNORMAL HIGH (ref 65–99)
Potassium: 3.8 mmol/L (ref 3.5–5.2)
Sodium: 139 mmol/L (ref 134–144)
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
## 2019-09-30 ENCOUNTER — Encounter: Payer: Self-pay | Admitting: Family Medicine

## 2019-09-30 ENCOUNTER — Other Ambulatory Visit: Payer: Self-pay

## 2019-09-30 ENCOUNTER — Ambulatory Visit (INDEPENDENT_AMBULATORY_CARE_PROVIDER_SITE_OTHER): Payer: 59 | Admitting: Family Medicine

## 2019-09-30 VITALS — BP 142/92 | HR 96 | Temp 98.0°F | Ht 75.0 in | Wt 386.0 lb

## 2019-09-30 DIAGNOSIS — I10 Essential (primary) hypertension: Secondary | ICD-10-CM | POA: Diagnosis not present

## 2019-09-30 DIAGNOSIS — E1169 Type 2 diabetes mellitus with other specified complication: Secondary | ICD-10-CM

## 2019-09-30 DIAGNOSIS — G629 Polyneuropathy, unspecified: Secondary | ICD-10-CM

## 2019-09-30 DIAGNOSIS — E669 Obesity, unspecified: Secondary | ICD-10-CM

## 2019-09-30 MED ORDER — DULOXETINE HCL 30 MG PO CPEP
30.0000 mg | ORAL_CAPSULE | Freq: Every day | ORAL | 3 refills | Status: DC
Start: 2019-09-30 — End: 2019-09-30

## 2019-09-30 MED ORDER — DULOXETINE HCL 30 MG PO CPEP: 30.0000 mg | ORAL_CAPSULE | Freq: Every day | ORAL | 3 refills | Status: DC

## 2019-09-30 MED FILL — DULoxetine HCL 30 MG CPEP: 30 | 30 days supply | Qty: 30 | Fill #0
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
## 2019-09-30 NOTE — Patient Instructions (Signed)
If you do not hear anything about your referrals in the next 1-2 weeks, call our office and ask for an update.  Keep the diet clean and stay active.  Let us know if you need anything.

## 2019-10-06 ENCOUNTER — Other Ambulatory Visit: Payer: Self-pay

## 2019-10-06 ENCOUNTER — Ambulatory Visit (INDEPENDENT_AMBULATORY_CARE_PROVIDER_SITE_OTHER): Payer: 59 | Admitting: Neurology

## 2019-10-06 ENCOUNTER — Encounter: Payer: Self-pay | Admitting: Neurology

## 2019-10-06 VITALS — BP 150/100 | HR 104 | Ht 75.0 in | Wt 309.0 lb

## 2019-10-06 DIAGNOSIS — G629 Polyneuropathy, unspecified: Secondary | ICD-10-CM

## 2019-10-06 DIAGNOSIS — R202 Paresthesia of skin: Secondary | ICD-10-CM

## 2019-10-06 DIAGNOSIS — R260 Ataxic gait: Secondary | ICD-10-CM

## 2019-10-06 MED ORDER — TOPIRAMATE 50 MG PO TABS: 50.0000 mg | ORAL_TABLET | Freq: Two times a day (BID) | ORAL | 3 refills | Status: DC

## 2019-10-06 MED FILL — TOPIRAMATE 50 MG TABLET: 50 | 30 days supply | Qty: 60 | Fill #0

## 2019-10-06 NOTE — Patient Instructions (Signed)
I had a long discussion with the patient and his friend regarding his lower extremity numbness and gait imbalance likely being due to underlying peripheral neuropathy and recommend further evaluation by checking neuropathy panel labs, EMG nerve conduction study.  Trial of Topamax 50 mg daily for 1 week to be increased if tolerated without side effects to twice daily.  He was encouraged to continue to use his wheeled walker at all times and we discussed fall safety precautions.  He was also advised to see his primary care physician for treatment for his diabetes and for weight loss.  He will return for follow-up in 2 months or call earlier if necessary.  Neuropathic Pain Neuropathic pain is pain caused by damage to the nerves that are responsible for certain sensations in your body (sensory nerves). The pain can be caused by:  Damage to the sensory nerves that send signals to your spinal cord and brain (peripheral nervous system).  Damage to the sensory nerves in your brain or spinal cord (central nervous system). Neuropathic pain can make you more sensitive to pain. Even a minor sensation can feel very painful. This is usually a long-term condition that can be difficult to treat. The type of pain differs from person to person. It may:  Start suddenly (acute), or it may develop slowly and last for a long time (chronic).  Come and go as damaged nerves heal, or it may stay at the same level for years.  Cause emotional distress, loss of sleep, and a lower quality of life. What are the causes? The most common cause of this condition is diabetes. Many other diseases and conditions can also cause neuropathic pain. Causes of neuropathic pain can be classified as:  Toxic. This is caused by medicines and chemicals. The most common cause of toxic neuropathic pain is damage from cancer treatments (chemotherapy).  Metabolic. This can be caused by: ? Diabetes. This is the most common disease that damages the  nerves. ? Lack of vitamin B from long-term alcohol abuse.  Traumatic. Any injury that cuts, crushes, or stretches a nerve can cause damage and pain. A common example is feeling pain after losing an arm or leg (phantom limb pain).  Compression-related. If a sensory nerve gets trapped or compressed for a long period of time, the blood supply to the nerve can be cut off.  Vascular. Many blood vessel diseases can cause neuropathic pain by decreasing blood supply and oxygen to nerves.  Autoimmune. This type of pain results from diseases in which the body's defense system (immune system) mistakenly attacks sensory nerves. Examples of autoimmune diseases that can cause neuropathic pain include lupus and multiple sclerosis.  Infectious. Many types of viral infections can damage sensory nerves and cause pain. Shingles infection is a common cause of this type of pain.  Inherited. Neuropathic pain can be a symptom of many diseases that are passed down through families (genetic). What increases the risk? You are more likely to develop this condition if:  You have diabetes.  You smoke.  You drink too much alcohol.  You are taking certain medicines, including medicines that kill cancer cells (chemotherapy) or that treat immune system disorders. What are the signs or symptoms? The main symptom is pain. Neuropathic pain is often described as:  Burning.  Shock-like.  Stinging.  Hot or cold.  Itching. How is this diagnosed? No single test can diagnose neuropathic pain. It is diagnosed based on:  Physical exam and your symptoms. Your health care provider  will ask you about your pain. You may be asked to use a pain scale to describe how bad your pain is.  Tests. These may be done to see if you have a high sensitivity to pain and to help find the cause and location of any sensory nerve damage. They include: ? Nerve conduction studies to test how well nerve signals travel through your sensory  nerves (electrodiagnostic testing). ? Stimulating your sensory nerves through electrodes on your skin and measuring the response in your spinal cord and brain (somatosensory evoked potential).  Imaging studies, such as: ? X-rays. ? CT scan. ? MRI. How is this treated? Treatment for neuropathic pain may change over time. You may need to try different treatment options or a combination of treatments. Some options include:  Treating the underlying cause of the neuropathy, such as diabetes, kidney disease, or vitamin deficiencies.  Stopping medicines that can cause neuropathy, such as chemotherapy.  Medicine to relieve pain. Medicines may include: ? Prescription or over-the-counter pain medicine. ? Anti-seizure medicine. ? Antidepressant medicines. ? Pain-relieving patches that are applied to painful areas of skin. ? A medicine to numb the area (local anesthetic), which can be injected as a nerve block.  Transcutaneous nerve stimulation. This uses electrical currents to block painful nerve signals. The treatment is painless.  Alternative treatments, such as: ? Acupuncture. ? Meditation. ? Massage. ? Physical therapy. ? Pain management programs. ? Counseling. Follow these instructions at home: Medicines   Take over-the-counter and prescription medicines only as told by your health care provider.  Do not drive or use heavy machinery while taking prescription pain medicine.  If you are taking prescription pain medicine, take actions to prevent or treat constipation. Your health care provider may recommend that you: ? Drink enough fluid to keep your urine pale yellow. ? Eat foods that are high in fiber, such as fresh fruits and vegetables, whole grains, and beans. ? Limit foods that are high in fat and processed sugars, such as fried or sweet foods. ? Take an over-the-counter or prescription medicine for constipation. Lifestyle   Have a good support system at home.  Consider  joining a chronic pain support group.  Do not use any products that contain nicotine or tobacco, such as cigarettes and e-cigarettes. If you need help quitting, ask your health care provider.  Do not drink alcohol. General instructions  Learn as much as you can about your condition.  Work closely with all your health care providers to find the treatment plan that works best for you.  Ask your health care provider what activities are safe for you.  Keep all follow-up visits as told by your health care provider. This is important. Contact a health care provider if:  Your pain treatments are not working.  You are having side effects from your medicines.  You are struggling with tiredness (fatigue), mood changes, depression, or anxiety. Summary  Neuropathic pain is pain caused by damage to the nerves that are responsible for certain sensations in your body (sensory nerves).  Neuropathic pain may come and go as damaged nerves heal, or it may stay at the same level for years.  Neuropathic pain is usually a long-term condition that can be difficult to treat. Consider joining a chronic pain support group. This information is not intended to replace advice given to you by your health care provider. Make sure you discuss any questions you have with your health care provider. Document Revised: 06/20/2018 Document Reviewed: 03/16/2017 Elsevier Patient  Education  2020 [employer]  
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
## 2019-10-08 LAB — NEUROPATHY PANEL
A/G Ratio: 0.9 (ref 0.7–1.7)
Albumin ELP: 3.5 g/dL (ref 2.9–4.4)
Alpha 1: 0.2 g/dL (ref 0.0–0.4)
Alpha 2: 0.7 g/dL (ref 0.4–1.0)
Angio Convert Enzyme: 75 U/L (ref 14–82)
Anti Nuclear Antibody (ANA): NEGATIVE
Beta: 1.1 g/dL (ref 0.7–1.3)
Gamma Globulin: 2.1 g/dL — ABNORMAL HIGH (ref 0.4–1.8)
Globulin, Total: 4.1 g/dL — ABNORMAL HIGH (ref 2.2–3.9)
Rheumatoid fact SerPl-aCnc: 22.2 IU/mL — ABNORMAL HIGH (ref 0.0–13.9)
Sed Rate: 66 mm/hr — ABNORMAL HIGH (ref 0–15)
TSH: 1.78 u[IU]/mL (ref 0.450–4.500)
Total Protein: 7.6 g/dL (ref 6.0–8.5)
Vit D, 25-Hydroxy: 14.6 ng/mL — ABNORMAL LOW (ref 30.0–100.0)
Vitamin B-12: 307 pg/mL (ref 232–1245)

## 2019-10-09 ENCOUNTER — Telehealth: Payer: Self-pay

## 2019-10-09 NOTE — Telephone Encounter (Signed)
-----   Message from Garvin Fila, MD sent at 10/09/2019  9:06 AM EDT ----- Mark Rich inform the patient that lab work for causes of peripheral neuropathy indicates significantly low vitamin D levels as well as positive test for rheumatoid arthritis and elevated sed rate.  Advised the patient to see primary care physician for treatment and further evaluation for the same.

## 2019-10-09 NOTE — Telephone Encounter (Signed)
Pt verified by name and DOB, results given per provider, pt voiced understanding all question answered.  He will fu with PCP

## 2019-10-09 NOTE — Progress Notes (Signed)
Kindly inform the patient that lab work for causes of peripheral neuropathy indicates significantly low vitamin D levels as well as positive test for rheumatoid arthritis and elevated sed rate.  Advised the patient to see primary care physician for treatment and further evaluation for the same.

## 2019-10-13 ENCOUNTER — Encounter: Payer: Self-pay | Admitting: Family Medicine

## 2019-10-13 ENCOUNTER — Ambulatory Visit: Payer: 59 | Admitting: Family Medicine

## 2019-10-13 ENCOUNTER — Other Ambulatory Visit: Payer: Self-pay

## 2019-10-13 VITALS — BP 140/98 | HR 85 | Temp 97.8°F | Ht 75.0 in | Wt 309.0 lb

## 2019-10-13 DIAGNOSIS — M25562 Pain in left knee: Secondary | ICD-10-CM | POA: Diagnosis not present

## 2019-10-13 DIAGNOSIS — R030 Elevated blood-pressure reading, without diagnosis of hypertension: Secondary | ICD-10-CM

## 2019-10-13 DIAGNOSIS — E559 Vitamin D deficiency, unspecified: Secondary | ICD-10-CM | POA: Diagnosis not present

## 2019-10-13 DIAGNOSIS — G8929 Other chronic pain: Secondary | ICD-10-CM

## 2019-10-13 DIAGNOSIS — M25561 Pain in right knee: Secondary | ICD-10-CM | POA: Diagnosis not present

## 2019-10-13 MED ORDER — VITAMIN D (ERGOCALCIFEROL) 1.25 MG (50000 UNIT) PO CAPS: 50000.0000 [IU] | ORAL_CAPSULE | ORAL | 0 refills | Status: DC

## 2019-10-13 MED ORDER — DICLOFENAC SODIUM 1 % EX GEL: 4.0000 g | Freq: Four times a day (QID) | CUTANEOUS | 2 refills | Status: DC

## 2019-10-13 MED FILL — DICLOFENAC SODIUM 1 % GEL: 1 | 6 days supply | Qty: 100 | Fill #0

## 2019-10-13 MED FILL — VIT D2 1.25 MG (50,000 UNIT: 1.25 MG | 84 days supply | Qty: 12 | Fill #0
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
## 2019-10-13 NOTE — Patient Instructions (Addendum)
For the bariatric surgery team: [phone]  Ice/cold pack over area for 10-15 min twice daily.  Keep doing the stretches for the knee.   If you want to see the sports medicine team again or get injections, let me know.  The cream is available over the counter, let me know if there are cost issues.   Let us know if you need anything.

## 2019-10-16 ENCOUNTER — Telehealth: Payer: Self-pay | Admitting: Family Medicine

## 2019-10-16 NOTE — Telephone Encounter (Signed)
Pt came in office stating is needing a letter from provider directed to his [place] stating and explaining about pt's condition (pt can not walk well due to his condition) (Pt has a up coming court date) Lawyers name is [employer] Tel [phone] fax number [phone]. If any questions please contact pt at [phone]. Pt states is needing letter by 10-17-2019.

## 2019-10-17 ENCOUNTER — Telehealth: Payer: Self-pay | Admitting: Family Medicine

## 2019-10-17 ENCOUNTER — Encounter: Payer: Self-pay | Admitting: Family Medicine

## 2019-10-17 NOTE — Telephone Encounter (Signed)
OK to write letter stating he is seen at our office and that he has a medical condition making it difficult to walk. TY.

## 2019-10-17 NOTE — Telephone Encounter (Signed)
Letter done and the patient informed to pickup at the front desk

## 2019-10-17 NOTE — Telephone Encounter (Signed)
Patient calling in reference to letter he wanted  Dr Nani Ravens to write . Pt states the letter is needed today. Patient has a medical condition, that has caused him to to be able to easily walk.

## 2019-10-17 NOTE — Telephone Encounter (Signed)
How much detail does he need? Is writing "Patient has two medical conditions that affect his ability to walk as would be expected for an otherwise healthy 49 yo male"?

## 2019-11-03 ENCOUNTER — Ambulatory Visit: Payer: 59 | Admitting: Family Medicine

## 2019-11-05 LAB — HM DIABETES EYE EXAM

## 2019-11-06 ENCOUNTER — Ambulatory Visit (INDEPENDENT_AMBULATORY_CARE_PROVIDER_SITE_OTHER): Payer: 59 | Admitting: Neurology

## 2019-11-06 ENCOUNTER — Encounter: Payer: 59 | Admitting: Neurology

## 2019-11-06 DIAGNOSIS — M48062 Spinal stenosis, lumbar region with neurogenic claudication: Secondary | ICD-10-CM

## 2019-11-06 DIAGNOSIS — R202 Paresthesia of skin: Secondary | ICD-10-CM

## 2019-11-06 DIAGNOSIS — Z0289 Encounter for other administrative examinations: Secondary | ICD-10-CM

## 2019-11-06 DIAGNOSIS — G629 Polyneuropathy, unspecified: Secondary | ICD-10-CM

## 2019-11-06 DIAGNOSIS — R29898 Other symptoms and signs involving the musculoskeletal system: Secondary | ICD-10-CM

## 2019-11-06 DIAGNOSIS — M5416 Radiculopathy, lumbar region: Secondary | ICD-10-CM

## 2019-11-06 NOTE — Progress Notes (Addendum)
Full Name: Mark Rich Gender: Male MRN #: [id number] Date of Birth: Jan 30, 1971    Visit Date: 11/06/2019 09:23 Age: 49 Years Examining Physician: Sarina Ill, MD  Referring Physician: Antony Contras, MD Height: 6 feet 3 inch  History: Patient with PMHx of morbid obesity, HTN, gout, alcohol abuse, diabetes, chronic back pain and h/o back surgeries here for evaluation of progressive paresthesias in his toes which has spread to involve the knee down area. Today he also reports back pain with left radiculopathy.   Summary: EMG/NCS was performed on the bilateral lower extremities. The left Peroneal motor nerve showed reduced amplitude(1.8mV,N>2) and decreased conduction velocity(Fib head-Ankle,54m/s,N>44) and decreased conduction velocity(Pop Fossa-Fib head,91m/s,N>44). The right Peroneal motor nerve showed no response. The left Tibial and the right Tibial motor nerves showed no response. The left Sural sensory nerve showed delayed distal peak latency(4.68ms,N<4.4) and reduced amplitude(2uV,N>6). The left Superficial Peroneal sensory nerve showed reduced amplictude 3uV, N>6). The right Superficial Peroneal sensory nerve showed no response. All remaining nerves (as indicated in the following tables) were within normal limits.  The left tibialis anterior showed diminished motor unit recruitment, the left extensor hallucis longus showed increased spontaneous activity and diminished motor unit recruitment.  The left biceps femoris showed increased spontaneous activity. Cannot perform emg needle testing on the paraspinals as those are unreliable after surgery. All remaining muscles (as indicated in the following tables) were within normal limits.       Conclusion: 1. There is electrophysiologic evidence for acute on chronic left L5 radiculopathy and right-sided L5/S1 radiculopathy. Recommend MRI lumbar spine. 2. 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   Sarina Ill,  M.D.  [facility] [place], [place] [address] Tel: [phone] Fax: [phone]  Verbal informed consent was obtained from the patient, patient was informed of potential risk of procedure, including bruising, bleeding, hematoma formation, infection, muscle weakness, muscle pain, numbness, among others.         [place]    Nerve / Sites Muscle Latency Ref. Amplitude Ref. Rel Amp Segments Distance Velocity Ref. Area    ms ms mV mV %  cm m/s m/s mVms  L Peroneal - EDB     Ankle EDB 4.7 ?6.5 1.1 ?2.0 100 Ankle - EDB 9   3.8     Fib head EDB 13.3  0.5  47.1 Fib head - Ankle 31 36 ?44 2.2     Pop fossa EDB 16.0  0.4  77.9 Pop fossa - Fib head 10 36 ?44 2.0         Pop fossa - Ankle      R Peroneal - EDB     Ankle EDB NR ?6.5 NR ?2.0 NR Ankle - EDB 9   NR     Fib head EDB NR  NR  NR Fib head - Ankle 32 NR ?44 NR  L Tibial - AH     Ankle AH NR ?5.8 NR ?4.0 NR Ankle - AH 9 NR  NR  R Tibial - AH     Ankle AH NR ?5.8 NR ?4.0 NR Ankle - AH 9 NR  NR             SNC    Nerve / Sites Rec. Site Peak Lat Ref.  Amp Ref. Segments Distance    ms ms V V  cm  L Sural - Ankle (Calf)     Calf Ankle 4.7 ?4.4 2 ?6 Calf - Ankle 14  R  Sural - Ankle (Calf)     Calf Ankle 2.8 ?4.4 6 ?6 Calf - Ankle 14  L Superficial peroneal - Ankle     Lat leg Ankle 4.3 ?4.4 3 ?6 Lat leg - Ankle 14  R Superficial peroneal - Ankle     Lat leg Ankle NR ?4.4 NR ?6 Lat leg - Ankle 14             EMG Summary Table    Spontaneous MUAP Recruitment  Muscle IA Fib PSW Fasc Other Amp Dur. Poly Pattern  L. Vastus medialis Normal None None None _______ Normal Normal Normal Normal  L. Tibialis anterior Normal None None None _______ Normal Normal Normal Reduced  L. Gastrocnemius (Medial head) Normal None None None _______ Normal Normal Normal Normal  L. Extensor hallucis longus Normal 1+ 2+ None _______ Normal Normal Normal Reduced  L. Abductor hallucis Normal None None None _______ Normal Normal  Normal Normal  L. Biceps femoris (long head) Normal None 3+ None _______ Normal Normal Normal Normal  L. Gluteus maximus Normal None None None _______ Normal Normal Normal Normal  L. Gluteus medius Normal None None None _______ Normal Normal Normal Normal

## 2019-11-08 NOTE — Progress Notes (Signed)
See procedure note.

## 2019-11-08 NOTE — Addendum Note (Signed)
Addended by: Sarina Ill B on: 11/08/2019 02:29 PM   Modules accepted: Orders

## 2019-11-08 NOTE — Procedures (Signed)
Full Name: Mark Rich Gender: Male MRN #: [id number] Date of Birth: 1970-03-20    Visit Date: 11/06/2019 09:23 Age: 49 Years Examining Physician: Sarina Ill, MD  Referring Physician: Antony Contras, MD Height: 6 feet 3 inch  History: Patient with PMHx of morbid obesity, HTN, gout, alcohol abuse, diabetes, chronic back pain and h/o back surgeries here for evaluation of progressive paresthesias in his toes which has spread to involve the knee down area. Today he also reports back pain with left radiculopathy.   Summary: EMG/NCS was performed on the bilateral lower extremities. The left Peroneal motor nerve showed reduced amplitude(1.68mV,N>2) and decreased conduction velocity(Fib head-Ankle,43m/s,N>44) and decreased conduction velocity(Pop Fossa-Fib head,39m/s,N>44). The right Peroneal motor nerve showed no response. The left Tibial and the right Tibial motor nerves showed no response. The left Sural sensory nerve showed delayed distal peak latency(4.66ms,N<4.4) and reduced amplitude(2uV,N>6). The left Superficial Peroneal sensory nerve showed reduced amplictude 3uV, N>6). The right Superficial Peroneal sensory nerve showed no response. All remaining nerves (as indicated in the following tables) were within normal limits.  The left tibialis anterior showed diminished motor unit recruitment, the left extensor hallucis longus showed increased spontaneous activity and diminished motor unit recruitment.  The left biceps femoris showed increased spontaneous activity. Cannot perform emg needle testing on the paraspinals as those are unreliable after surgery. All remaining muscles (as indicated in the following tables) were within normal limits.       Conclusion: 1. There is electrophysiologic evidence for acute on chronic left L5 radiculopathy and right-sided L5/S1 radiculopathy. 2. 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   Sarina Ill, M.D.  [facility] [place], [place] [address] Tel: [phone] Fax: [phone]  Verbal informed consent was obtained from the patient, patient was informed of potential risk of procedure, including bruising, bleeding, hematoma formation, infection, muscle weakness, muscle pain, numbness, among others.         [place]    Nerve / Sites Muscle Latency Ref. Amplitude Ref. Rel Amp Segments Distance Velocity Ref. Area    ms ms mV mV %  cm m/s m/s mVms  L Peroneal - EDB     Ankle EDB 4.7 ?6.5 1.1 ?2.0 100 Ankle - EDB 9   3.8     Fib head EDB 13.3  0.5  47.1 Fib head - Ankle 31 36 ?44 2.2     Pop fossa EDB 16.0  0.4  77.9 Pop fossa - Fib head 10 36 ?44 2.0         Pop fossa - Ankle      R Peroneal - EDB     Ankle EDB NR ?6.5 NR ?2.0 NR Ankle - EDB 9   NR     Fib head EDB NR  NR  NR Fib head - Ankle 32 NR ?44 NR  L Tibial - AH     Ankle AH NR ?5.8 NR ?4.0 NR Ankle - AH 9 NR  NR  R Tibial - AH     Ankle AH NR ?5.8 NR ?4.0 NR Ankle - AH 9 NR  NR             SNC    Nerve / Sites Rec. Site Peak Lat Ref.  Amp Ref. Segments Distance    ms ms V V  cm  L Sural - Ankle (Calf)     Calf Ankle 4.7 ?4.4 2 ?6 Calf - Ankle 14  R Sural - Ankle (Calf)  Calf Ankle 2.8 ?4.4 6 ?6 Calf - Ankle 14  L Superficial peroneal - Ankle     Lat leg Ankle 4.3 ?4.4 3 ?6 Lat leg - Ankle 14  R Superficial peroneal - Ankle     Lat leg Ankle NR ?4.4 NR ?6 Lat leg - Ankle 14             EMG Summary Table    Spontaneous MUAP Recruitment  Muscle IA Fib PSW Fasc Other Amp Dur. Poly Pattern  L. Vastus medialis Normal None None None _______ Normal Normal Normal Normal  L. Tibialis anterior Normal None None None _______ Normal Normal Normal Reduced  L. Gastrocnemius (Medial head) Normal None None None _______ Normal Normal Normal Normal  L. Extensor hallucis longus Normal 1+ 2+ None _______ Normal Normal Normal Reduced  L. Abductor hallucis Normal None None None _______ Normal Normal Normal Normal  L. Biceps  femoris (long head) Normal None 3+ None _______ Normal Normal Normal Normal  L. Gluteus maximus Normal None None None _______ Normal Normal Normal Normal  L. Gluteus medius Normal None None None _______ Normal Normal Normal Normal

## 2019-11-10 ENCOUNTER — Telehealth: Payer: Self-pay | Admitting: Neurology

## 2019-11-10 NOTE — Telephone Encounter (Signed)
[employer] pending uploaded notes

## 2019-11-10 NOTE — Telephone Encounter (Signed)
[employer] auth: [id number] (exp. 11/10/19 to 02/08/20) order sent to GI. They will reach out to the patient to schedule.

## 2019-11-11 ENCOUNTER — Other Ambulatory Visit: Payer: Self-pay | Admitting: Neurology

## 2019-11-11 DIAGNOSIS — Z77018 Contact with and (suspected) exposure to other hazardous metals: Secondary | ICD-10-CM

## 2019-11-12 ENCOUNTER — Other Ambulatory Visit: Payer: Self-pay

## 2019-11-12 ENCOUNTER — Ambulatory Visit
Admission: RE | Admit: 2019-11-12 | Discharge: 2019-11-12 | Disposition: A | Discharge status: Home / Self Care | Payer: 59 | Source: Ambulatory Visit | Attending: Neurology | Admitting: Neurology

## 2019-11-12 DIAGNOSIS — Z77018 Contact with and (suspected) exposure to other hazardous metals: Secondary | ICD-10-CM

## 2019-11-16 ENCOUNTER — Ambulatory Visit
Admission: RE | Admit: 2019-11-16 | Discharge: 2019-11-16 | Disposition: A | Discharge status: Home / Self Care | Payer: 59 | Source: Ambulatory Visit | Attending: Neurology | Admitting: Neurology

## 2019-11-16 ENCOUNTER — Other Ambulatory Visit: Payer: Self-pay

## 2019-11-16 DIAGNOSIS — R29898 Other symptoms and signs involving the musculoskeletal system: Secondary | ICD-10-CM

## 2019-11-16 DIAGNOSIS — M48062 Spinal stenosis, lumbar region with neurogenic claudication: Secondary | ICD-10-CM

## 2019-11-16 DIAGNOSIS — M5416 Radiculopathy, lumbar region: Secondary | ICD-10-CM

## 2019-11-16 DIAGNOSIS — R202 Paresthesia of skin: Secondary | ICD-10-CM

## 2019-11-18 ENCOUNTER — Telehealth: Payer: Self-pay | Admitting: Neurology

## 2019-11-18 ENCOUNTER — Other Ambulatory Visit: Payer: Self-pay | Admitting: Neurology

## 2019-11-18 MED ORDER — ALPRAZOLAM 0.25 MG PO TABS: 0.2500 mg | ORAL_TABLET | ORAL | 0 refills | Status: DC | PRN

## 2019-11-18 NOTE — Telephone Encounter (Signed)
Ok for xanax 0. 25 mg for mri and may repeat x 1

## 2019-11-18 NOTE — Telephone Encounter (Signed)
Pt's caregiver, Mark Rich(not on DPR) called, was not able to do MRI because, could not fit in machine and sweating a lot. Was advise next appt prescribe Valium or Xanax.

## 2019-11-18 NOTE — Telephone Encounter (Signed)
Okay noted

## 2019-11-19 NOTE — Progress Notes (Signed)
I spoke to the patient listed number and spoke to his mother and gave results of EMG nerve conduction studies confirming both a peripheral neuropathy likely from underlying diabetes as well as chronic bilateral radiculopathies likely from his previous back surgeries which appear to be chronic.  She stated she would communicate the results to her son.  He was advised to call me back if he had any further questions else keep his follow-up appointment.  She voiced understanding

## 2019-11-19 NOTE — Telephone Encounter (Signed)
I spoke with the patient and discussed new prescription for Xanax PO for MRI and also plan to try to coordinate an open MRI. The patient is aware that he should not drive while taking this medication. He verbalized appreciation. He also requested that his ex-wife, Bilal Manzer, be removed from his contacts in the chart. We can speak with the pt or his  mother per latest DPR on file. I also called his pharmacy and let them know not to fill the medication until the patient calls as he does not have a date set for his MRI.

## 2019-11-20 ENCOUNTER — Other Ambulatory Visit: Payer: Self-pay | Admitting: Family Medicine

## 2019-11-20 DIAGNOSIS — I10 Essential (primary) hypertension: Secondary | ICD-10-CM

## 2019-11-20 NOTE — Telephone Encounter (Signed)
Noted, I started the process of the sight location change.. it is pending for [facility] .

## 2019-11-24 ENCOUNTER — Encounter: Payer: Self-pay | Admitting: Family Medicine

## 2019-11-24 ENCOUNTER — Ambulatory Visit (INDEPENDENT_AMBULATORY_CARE_PROVIDER_SITE_OTHER): Payer: 59 | Admitting: Family Medicine

## 2019-11-24 ENCOUNTER — Other Ambulatory Visit: Payer: Self-pay

## 2019-11-24 VITALS — BP 122/84 | HR 85 | Temp 97.6°F | Ht 75.0 in | Wt 383.4 lb

## 2019-11-24 DIAGNOSIS — M25561 Pain in right knee: Secondary | ICD-10-CM

## 2019-11-24 DIAGNOSIS — M25562 Pain in left knee: Secondary | ICD-10-CM | POA: Diagnosis not present

## 2019-11-24 DIAGNOSIS — G8929 Other chronic pain: Secondary | ICD-10-CM

## 2019-11-24 DIAGNOSIS — N529 Male erectile dysfunction, unspecified: Secondary | ICD-10-CM

## 2019-11-24 MED ORDER — SILDENAFIL CITRATE 100 MG PO TABS: 50.0000 mg | ORAL_TABLET | Freq: Every day | ORAL | 11 refills | Status: DC | PRN

## 2019-11-24 MED ORDER — DICLOFENAC SODIUM 1 % EX GEL: 4.0000 g | Freq: Four times a day (QID) | CUTANEOUS | 5 refills | Status: DC

## 2019-11-24 MED ORDER — HYDROXYZINE HCL 25 MG PO TABS: 25.0000 mg | ORAL_TABLET | Freq: Three times a day (TID) | ORAL | 0 refills | Status: DC | PRN

## 2019-11-24 MED FILL — SILDENAFIL CITRATE 100 MG T: 100 | 10 days supply | Qty: 10 | Fill #0

## 2019-11-24 MED FILL — hydrOXYzine HCL 25 MG TABS: 25 | 10 days supply | Qty: 30 | Fill #0

## 2019-11-24 MED FILL — DICLOFENAC SODIUM 1 % GEL: 1 | 7 days supply | Qty: 100 | Fill #0

## 2019-11-24 NOTE — Patient Instructions (Signed)
I think you look good today.  Keep taking your medicine for now.  Call your insurance company to see what erectile dysfunction medications they wil cover. Looking for manufacturer coupons online is also reasonable.   Let us know if you need anything.

## 2019-11-24 NOTE — Telephone Encounter (Signed)
Noted, I was about to get the location switch to [facility] /with his insurance [employer]. [employer] Auth: [id number] (exp. 11/10/19 to 02/08/20)  Faxed order to [facility] they will reach out to the patient to schedule.
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
## 2019-12-01 MED FILL — ALPRAZolam 0.25 MG TABS: 0.25 | 2 days supply | Qty: 2 | Fill #0

## 2019-12-01 NOTE — Telephone Encounter (Signed)
Scheduled at [facility] for 12/02/19.

## 2019-12-08 ENCOUNTER — Inpatient Hospital Stay: Admission: RE | Admit: 2019-12-08 | Payer: 59 | Source: Ambulatory Visit

## 2019-12-15 ENCOUNTER — Ambulatory Visit (INDEPENDENT_AMBULATORY_CARE_PROVIDER_SITE_OTHER): Payer: 59 | Admitting: Neurology

## 2019-12-15 ENCOUNTER — Other Ambulatory Visit: Payer: Self-pay

## 2019-12-15 ENCOUNTER — Encounter: Payer: Self-pay | Admitting: Neurology

## 2019-12-15 ENCOUNTER — Telehealth: Payer: Self-pay | Admitting: Neurology

## 2019-12-15 VITALS — BP 144/75 | HR 83 | Ht 74.0 in | Wt 398.0 lb

## 2019-12-15 DIAGNOSIS — E559 Vitamin D deficiency, unspecified: Secondary | ICD-10-CM | POA: Diagnosis not present

## 2019-12-15 DIAGNOSIS — M5417 Radiculopathy, lumbosacral region: Secondary | ICD-10-CM

## 2019-12-15 DIAGNOSIS — G609 Hereditary and idiopathic neuropathy, unspecified: Secondary | ICD-10-CM

## 2019-12-15 MED ORDER — TOPIRAMATE 50 MG PO TABS: 100.0000 mg | ORAL_TABLET | Freq: Two times a day (BID) | ORAL | 3 refills | Status: DC

## 2019-12-15 NOTE — Patient Instructions (Addendum)
I had a long discussion the patient and his mother regarding the findings of EMG nerve conduction study and MRI of the lumbar spine as well as neuropathy panel labs and answered questions.  I recommend we increase the Topamax to 100 mg twice daily gradually over the next 2 weeks as tolerated.  Refer to neurosurgery to consider surgical options versus interventional pain management treatment for his chronic back and radicular pain.  Continue vitamin D replacement as per primary care physicians for his vitamin D deficiency.  He will return for follow-up in the future as needed only.  Lumbosacral Radiculopathy Lumbosacral radiculopathy is a condition that involves the spinal nerves and nerve roots in the low back and bottom of the spine. The condition develops when these nerves and nerve roots move out of place or become inflamed and cause symptoms. What are the causes? This condition may be caused by:  Pressure from a disk that bulges out of place (herniated disk). A disk is a plate of soft cartilage that separates bones in the spine.  Disk changes that occur with age (disk degeneration).  A narrowing of the bones of the lower back (spinal stenosis).  A tumor.  An infection.  An injury that places sudden pressure on the disks that cushion the bones of your lower spine. What increases the risk? You are more likely to develop this condition if:  You are a male who is 49-35 years old.  You are a male who is 49-68 years old.  You use improper technique when lifting things.  You are overweight or live a sedentary lifestyle.  Your work requires frequent lifting.  You smoke.  You do repetitive activities that strain the spine. What are the signs or symptoms? Symptoms of this condition include:  Pain that goes down from your back into your legs (sciatica), usually on one side of the body. This is the most common symptom. The pain may be worse with sitting, coughing, or sneezing.  Pain  and numbness in your legs.  Muscle weakness.  Tingling.  Loss of bladder control or bowel control. How is this diagnosed? This condition may be diagnosed based on:  Your symptoms and medical history.  A physical exam. If the pain is lasting, you may have tests, such as:  MRI scan.  X-ray.  CT scan.  A type of X-ray used to examine the spinal canal after injecting a dye into your spine (myelogram).  A test to measure how electrical impulses move through a nerve (nerve conduction study). How is this treated? Treatment may depend on the cause of the condition and may include:  Working with a physical therapist.  Taking pain medicine.  Applying heat and ice to affected areas.  Doing stretches to improve flexibility.  Doing exercises to strengthen back muscles.  Having chiropractic spinal manipulation.  Using transcutaneous electrical nerve stimulation (TENS) therapy.  Getting a steroid injection in the spine. In some cases, no treatment is needed. If the condition is long-lasting (chronic), or if symptoms are severe, treatment may involve surgery or lifestyle changes, such as following a weight-loss plan. Follow these instructions at home: Activity  Avoid bending and other activities that make the problem worse.  Maintain a proper position when standing or sitting: ? When standing, keep your upper back and neck straight, with your shoulders pulled back. Avoid slouching. ? When sitting, keep your back straight and relax your shoulders. Do not round your shoulders or pull them backward.  Do not sit  or stand in one place for long periods of time.  Take brief periods of rest throughout the day. This will reduce your pain. It is usually better to rest by lying down or standing, not sitting.  When you are resting for longer periods, mix in some mild activity or stretching between periods of rest. This will help to prevent stiffness and pain.  Get regular exercise. Ask  your health care provider what activities are safe for you. If you were shown how to do any exercises or stretches, do them as directed by your health care provider.  Do not lift anything that is heavier than 10 lb (4.5 kg) or the limit that you are told by your health care provider. Always use proper lifting technique, which includes: ? Bending your knees. ? Keeping the load close to your body. ? Avoiding twisting. Managing pain  If directed, put ice on the affected area: ? Put ice in a plastic bag. ? Place a towel between your skin and the bag. ? Leave the ice on for 20 minutes, 2-3 times a day.  If directed, apply heat to the affected area as often as told by your health care provider. Use the heat source that your health care provider recommends, such as a moist heat pack or a heating pad. ? Place a towel between your skin and the heat source. ? Leave the heat on for 20-30 minutes. ? Remove the heat if your skin turns bright red. This is especially important if you are unable to feel pain, heat, or cold. You may have a greater risk of getting burned.  Take over-the-counter and prescription medicines only as told by your health care provider. General instructions  Sleep on a firm mattress in a comfortable position. Try lying on your side with your knees slightly bent. If you lie on your back, put a pillow under your knees.  Do not drive or use heavy machinery while taking prescription pain medicine.  If your health care provider prescribed a diet or exercise program, follow it as directed.  Keep all follow-up visits as told by your health care provider. This is important. Contact a health care provider if:  Your pain does not improve over time, even when taking pain medicines. Get help right away if:  You develop severe pain.  Your pain suddenly gets worse.  You develop increasing weakness in your legs.  You lose the ability to control your bladder or bowel.  You have  difficulty walking or balancing.  You have a fever. Summary  Lumbosacral radiculopathy is a condition that occurs when the spinal nerves and nerve roots in the lower part of the spine move out of place or become inflamed and cause symptoms.  Symptoms include pain, numbness, and tingling that go down from your back into your legs (sciatica), muscle weakness, and loss of bladder control or bowel control.  If directed, apply ice or heat to the affected area as told by your health care provider.  Follow instructions about activity, rest, and proper lifting technique. This information is not intended to replace advice given to you by your health care provider. Make sure you discuss any questions you have with your health care provider. Document Revised: 02/15/2017 Document Reviewed: 02/15/2017 Elsevier Patient Education  [place].
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
## 2019-12-15 NOTE — Telephone Encounter (Signed)
Pharmacy called and LVM wanting clarification on instructions for the pt's topiramate (TOPAMAX) 50 MG tablet prescription. Please advise.

## 2019-12-16 NOTE — Telephone Encounter (Signed)
Called Pharmacy for more information, prescription as written is unclear and if they dispense 1 tablet to take 2 x day and then 2 tablets to take 2 x day, patient has enough for 2 weeks.  To fill prescription, patient would need a total of 120 tablets for 30 days, or 106 for 1 month trial.    Please advise.

## 2019-12-17 ENCOUNTER — Other Ambulatory Visit: Payer: Self-pay | Admitting: Emergency Medicine

## 2019-12-17 MED ORDER — TOPIRAMATE 50 MG PO TABS: 100.0000 mg | ORAL_TABLET | Freq: Two times a day (BID) | ORAL | 3 refills | Status: DC

## 2019-12-17 MED FILL — TOPIRAMATE 50 MG TABLET: 50 | 34 days supply | Qty: 120 | Fill #0

## 2019-12-29 ENCOUNTER — Other Ambulatory Visit: Payer: Self-pay | Admitting: Family Medicine

## 2019-12-29 DIAGNOSIS — E559 Vitamin D deficiency, unspecified: Secondary | ICD-10-CM

## 2019-12-29 MED FILL — DICLOFENAC SODIUM 1 % GEL: 1 | 7 days supply | Qty: 100 | Fill #1

## 2019-12-29 MED FILL — VIT D2 1.25 MG (50,000 UNIT: 1.25 MG | 84 days supply | Qty: 12 | Fill #0

## 2019-12-29 MED FILL — TOPIRAMATE 50 MG TABLET: 50 | 34 days supply | Qty: 120 | Fill #0

## 2019-12-30 ENCOUNTER — Other Ambulatory Visit: Payer: Self-pay | Admitting: Neurosurgery

## 2019-12-30 DIAGNOSIS — M48061 Spinal stenosis, lumbar region without neurogenic claudication: Secondary | ICD-10-CM

## 2019-12-30 MED FILL — DULoxetine HCL 30 MG CPEP: 30 | 30 days supply | Qty: 30 | Fill #1

## 2020-01-01 ENCOUNTER — Ambulatory Visit
Admission: RE | Admit: 2020-01-01 | Discharge: 2020-01-01 | Disposition: A | Discharge status: Home / Self Care | Payer: 59 | Source: Ambulatory Visit | Attending: Neurosurgery | Admitting: Neurosurgery

## 2020-01-01 DIAGNOSIS — M48061 Spinal stenosis, lumbar region without neurogenic claudication: Secondary | ICD-10-CM

## 2020-01-05 ENCOUNTER — Ambulatory Visit: Payer: 59 | Admitting: Family Medicine

## 2020-01-05 ENCOUNTER — Other Ambulatory Visit: Payer: Self-pay

## 2020-01-05 ENCOUNTER — Encounter: Payer: Self-pay | Admitting: Family Medicine

## 2020-01-05 VITALS — BP 128/88 | HR 77 | Temp 97.5°F | Ht 75.0 in | Wt 393.2 lb

## 2020-01-05 DIAGNOSIS — E559 Vitamin D deficiency, unspecified: Secondary | ICD-10-CM

## 2020-01-05 DIAGNOSIS — Z23 Encounter for immunization: Secondary | ICD-10-CM | POA: Diagnosis not present

## 2020-01-05 DIAGNOSIS — L309 Dermatitis, unspecified: Secondary | ICD-10-CM | POA: Diagnosis not present

## 2020-01-05 DIAGNOSIS — G629 Polyneuropathy, unspecified: Secondary | ICD-10-CM

## 2020-01-05 MED ORDER — OZEMPIC (0.25 OR 0.5 MG/DOSE) 2 MG/1.5ML ~~LOC~~ SOPN: PEN_INJECTOR | SUBCUTANEOUS | 1 refills | Status: AC

## 2020-01-05 MED ORDER — TRIAMCINOLONE ACETONIDE 0.1 % EX CREA: 1.0000 "application " | TOPICAL_CREAM | Freq: Two times a day (BID) | CUTANEOUS | 0 refills | Status: DC
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
## 2020-01-05 NOTE — Patient Instructions (Signed)
Give [place] 2-3 business days to get the results of your labs back.   Keep the diet clean and stay active.  Let me know if there are cost issues with the Ozempic.  Call the bariatric surgery team for an appointment. You shouldn't need a new referral, but if you do, please send me a message.  Let us know if you need anything.

## 2020-01-06 LAB — VITAMIN D 25 HYDROXY (VIT D DEFICIENCY, FRACTURES): Vit D, 25-Hydroxy: 37 ng/mL (ref 30–100)

## 2020-02-23 ENCOUNTER — Ambulatory Visit: Payer: 59 | Admitting: Family Medicine

## 2020-02-23 DIAGNOSIS — Z0289 Encounter for other administrative examinations: Secondary | ICD-10-CM

## 2020-08-12 ENCOUNTER — Telehealth: Payer: Self-pay | Admitting: Family Medicine

## 2020-08-12 NOTE — Telephone Encounter (Signed)
Called pt and lvm to return call to make an appt

## 2020-08-12 NOTE — Telephone Encounter (Signed)
Appt made by front desk for 06/03

## 2020-08-12 NOTE — Telephone Encounter (Signed)
Pt, called would like a replica of the letter you gave him prior  in regards to his condition not able to walk pt, states he needs this letter urgently  Please adive

## 2020-08-12 NOTE — Telephone Encounter (Signed)
Spoke with patient states he just needs a letter with his dx of spinal stenosis which causes problems with working and walking  and his treatment plan for his lawyer ...  Do you want pt to come in for a visit for the letter since he has not been seen since 2021

## 2020-08-13 ENCOUNTER — Ambulatory Visit: Payer: 59 | Admitting: Family Medicine

## 2020-08-24 ENCOUNTER — Telehealth: Payer: Self-pay | Admitting: Family Medicine

## 2020-08-24 NOTE — Telephone Encounter (Signed)
Patient states he has to court on 08/27/19. He need a letter explaining his medication condition.

## 2020-08-24 NOTE — Telephone Encounter (Signed)
The patient states that Dr. Cottie Banda has written a letter to him in the past. The patient states he would like to pick up the letter tomorrow morning.

## 2020-08-24 NOTE — Telephone Encounter (Signed)
He said there was an increase in child support and he is not working due to his inability to walk. He stated PCP has written a letter previously for this.  He has to appear in court on Thursday and will need the letter by then .

## 2020-08-25 ENCOUNTER — Encounter: Payer: Self-pay | Admitting: Family Medicine

## 2020-08-25 NOTE — Telephone Encounter (Signed)
Letter done Called the patient to pickup---no answer/mailbox full

## 2020-08-25 NOTE — Telephone Encounter (Signed)
Patient informed. 

## 2020-11-23 ENCOUNTER — Ambulatory Visit: Payer: Self-pay | Admitting: Family Medicine

## 2020-11-29 ENCOUNTER — Encounter: Payer: Self-pay | Admitting: Family Medicine

## 2020-11-29 ENCOUNTER — Ambulatory Visit: Payer: Self-pay | Admitting: Family Medicine

## 2020-11-30 ENCOUNTER — Other Ambulatory Visit: Payer: Self-pay

## 2020-11-30 ENCOUNTER — Encounter: Payer: Self-pay | Admitting: Family Medicine

## 2020-11-30 ENCOUNTER — Ambulatory Visit (INDEPENDENT_AMBULATORY_CARE_PROVIDER_SITE_OTHER): Payer: Self-pay | Admitting: Family Medicine

## 2020-11-30 VITALS — BP 160/100 | HR 90 | Temp 98.1°F | Ht 75.0 in | Wt 363.5 lb

## 2020-11-30 DIAGNOSIS — G8929 Other chronic pain: Secondary | ICD-10-CM

## 2020-11-30 DIAGNOSIS — M25562 Pain in left knee: Secondary | ICD-10-CM

## 2020-11-30 DIAGNOSIS — E669 Obesity, unspecified: Secondary | ICD-10-CM

## 2020-11-30 DIAGNOSIS — M545 Low back pain, unspecified: Secondary | ICD-10-CM

## 2020-11-30 DIAGNOSIS — M25561 Pain in right knee: Secondary | ICD-10-CM

## 2020-11-30 DIAGNOSIS — I10 Essential (primary) hypertension: Secondary | ICD-10-CM

## 2020-11-30 DIAGNOSIS — G629 Polyneuropathy, unspecified: Secondary | ICD-10-CM

## 2020-11-30 DIAGNOSIS — E1169 Type 2 diabetes mellitus with other specified complication: Secondary | ICD-10-CM

## 2020-11-30 MED ORDER — LISINOPRIL 20 MG PO TABS: 20.0000 mg | ORAL_TABLET | Freq: Every day | ORAL | 1 refills | Status: DC

## 2020-11-30 MED ORDER — PRAVASTATIN SODIUM 40 MG PO TABS: 40.0000 mg | ORAL_TABLET | Freq: Every day | ORAL | 1 refills | Status: DC

## 2020-11-30 MED ORDER — TRAMADOL HCL 50 MG PO TABS: 50.0000 mg | ORAL_TABLET | Freq: Two times a day (BID) | ORAL | 1 refills | Status: DC | PRN
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
## 2020-11-30 NOTE — Patient Instructions (Signed)
If you do not hear anything about your referral in the next 1-2 weeks, call our office and ask for an update.  Consider the [facility] and [facility] for your care. They have more resources available for uninsured patients and may be better able to provide you the care you need.  [place], [place] [address] [phone]   Let us know if you need anything.

## 2020-12-01 ENCOUNTER — Encounter: Payer: Self-pay | Admitting: Family Medicine

## 2021-01-19 ENCOUNTER — Telehealth: Payer: Self-pay | Admitting: Family Medicine

## 2021-01-19 NOTE — Telephone Encounter (Signed)
Pt in workup phase, lost insurance so was lost to f/u, hopefully temporary. Cannot walk or stand at this time.

## 2021-01-19 NOTE — Telephone Encounter (Signed)
Called Social Services informed of PCP response. She verbalized understanding.

## 2021-01-19 NOTE — Telephone Encounter (Signed)
Helene Kelp from [place] of social services called regarding pt. Helene Kelp received a letter indicating that the pt will not be able to work. She would like to know if it is indefinite or would he be returning at some point. As well as if his disability is limited to standing or walking. Information is needed before court date next week. Voicemail can be left if there's no answer. Please advise.

## 2021-03-28 ENCOUNTER — Ambulatory Visit: Payer: Self-pay | Admitting: Family Medicine

## 2021-04-04 ENCOUNTER — Encounter: Payer: Self-pay | Admitting: Family Medicine

## 2021-04-04 ENCOUNTER — Other Ambulatory Visit (HOSPITAL_BASED_OUTPATIENT_CLINIC_OR_DEPARTMENT_OTHER): Payer: Self-pay

## 2021-04-04 ENCOUNTER — Ambulatory Visit (INDEPENDENT_AMBULATORY_CARE_PROVIDER_SITE_OTHER): Payer: Self-pay | Admitting: Family Medicine

## 2021-04-04 ENCOUNTER — Telehealth: Payer: Self-pay

## 2021-04-04 VITALS — BP 146/102 | HR 75 | Temp 98.4°F | Ht 75.0 in | Wt 372.0 lb

## 2021-04-04 DIAGNOSIS — E1169 Type 2 diabetes mellitus with other specified complication: Secondary | ICD-10-CM

## 2021-04-04 DIAGNOSIS — G8929 Other chronic pain: Secondary | ICD-10-CM

## 2021-04-04 DIAGNOSIS — R5381 Other malaise: Secondary | ICD-10-CM

## 2021-04-04 DIAGNOSIS — Z23 Encounter for immunization: Secondary | ICD-10-CM

## 2021-04-04 DIAGNOSIS — M7062 Trochanteric bursitis, left hip: Secondary | ICD-10-CM

## 2021-04-04 DIAGNOSIS — M545 Low back pain, unspecified: Secondary | ICD-10-CM

## 2021-04-04 DIAGNOSIS — I10 Essential (primary) hypertension: Secondary | ICD-10-CM

## 2021-04-04 DIAGNOSIS — Z1211 Encounter for screening for malignant neoplasm of colon: Secondary | ICD-10-CM

## 2021-04-04 DIAGNOSIS — E669 Obesity, unspecified: Secondary | ICD-10-CM

## 2021-04-04 MED ORDER — PRAVASTATIN SODIUM 40 MG PO TABS
40.0000 mg | ORAL_TABLET | Freq: Every day | ORAL | 4 refills | Status: DC
  Filled 2021-04-04: qty 30, 30d supply, fill #0
  Filled 2021-05-22: qty 30, 30d supply, fill #1
  Filled 2021-06-20: qty 30, 30d supply, fill #2

## 2021-04-04 MED ORDER — OZEMPIC (0.25 OR 0.5 MG/DOSE) 2 MG/1.5ML ~~LOC~~ SOPN
PEN_INJECTOR | SUBCUTANEOUS | 1 refills | Status: AC
  Filled 2021-04-04: qty 1.5, 35d supply, fill #0
  Filled 2021-05-22: qty 4.5, 84d supply, fill #1
  Filled 2021-10-10: qty 1.5, 28d supply, fill #2
  Filled 2021-11-04: qty 4.5, 28d supply, fill #2

## 2021-04-04 MED ORDER — TRAMADOL HCL 50 MG PO TABS: 50.0000 mg | ORAL_TABLET | Freq: Two times a day (BID) | ORAL | 1 refills | Status: DC | PRN

## 2021-04-04 MED ORDER — LISINOPRIL-HYDROCHLOROTHIAZIDE 20-25 MG PO TABS
1.0000 | ORAL_TABLET | Freq: Every day | ORAL | 3 refills | Status: DC
  Filled 2021-04-04: qty 30, 30d supply, fill #0
  Filled 2021-05-22: qty 30, 30d supply, fill #1
  Filled 2021-06-20: qty 30, 30d supply, fill #2

## 2021-04-04 MED ORDER — TRAMADOL HCL 50 MG PO TABS
50.0000 mg | ORAL_TABLET | Freq: Two times a day (BID) | ORAL | 1 refills | Status: DC | PRN
  Filled 2021-04-04: qty 14, 7d supply, fill #0

## 2021-04-04 MED ORDER — METHYLPREDNISOLONE ACETATE 40 MG/ML IJ SUSP
40.0000 mg | Freq: Once | INTRAMUSCULAR | Status: AC
  Administered 2021-04-04: 40 mg via INTRA_ARTICULAR
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
## 2021-04-04 NOTE — Telephone Encounter (Signed)
PA initiated via Covermymeds; KEY: [id number]. Awaiting determination.

## 2021-04-04 NOTE — Patient Instructions (Signed)
Give us 2-3 business days to get the results of your labs back.   Keep the diet clean and stay active.  If you do not hear anything about your referral in the next 1-2 weeks, call our office and ask for an update.  Let us know if you need anything. 

## 2021-04-05 ENCOUNTER — Other Ambulatory Visit (HOSPITAL_BASED_OUTPATIENT_CLINIC_OR_DEPARTMENT_OTHER): Payer: Self-pay

## 2021-04-05 NOTE — Telephone Encounter (Signed)
Faxed over completion of PA form/included office visit notes.

## 2021-04-06 ENCOUNTER — Other Ambulatory Visit (HOSPITAL_BASED_OUTPATIENT_CLINIC_OR_DEPARTMENT_OTHER): Payer: Self-pay

## 2021-04-06 NOTE — Telephone Encounter (Signed)
PA approved.   PA Case: [id number], Status: Approved, Coverage Starts on: 04/04/2021 12:00 AM, Coverage Ends on: 04/04/2022 12:00 AM. Questions? Contact [phone].

## 2021-04-29 ENCOUNTER — Encounter: Payer: Self-pay | Admitting: Family Medicine

## 2021-04-29 ENCOUNTER — Other Ambulatory Visit (HOSPITAL_BASED_OUTPATIENT_CLINIC_OR_DEPARTMENT_OTHER): Payer: Self-pay

## 2021-04-29 ENCOUNTER — Ambulatory Visit (INDEPENDENT_AMBULATORY_CARE_PROVIDER_SITE_OTHER): Payer: 59 | Admitting: Family Medicine

## 2021-04-29 VITALS — BP 122/82 | HR 103 | Temp 97.8°F | Ht 75.0 in | Wt 372.0 lb

## 2021-04-29 DIAGNOSIS — M545 Low back pain, unspecified: Secondary | ICD-10-CM | POA: Diagnosis not present

## 2021-04-29 DIAGNOSIS — F418 Other specified anxiety disorders: Secondary | ICD-10-CM

## 2021-04-29 DIAGNOSIS — S161XXA Strain of muscle, fascia and tendon at neck level, initial encounter: Secondary | ICD-10-CM | POA: Diagnosis not present

## 2021-04-29 MED ORDER — KETOROLAC TROMETHAMINE 60 MG/2ML IM SOLN
60.0000 mg | Freq: Once | INTRAMUSCULAR | Status: AC
  Administered 2021-04-29: 60 mg via INTRAMUSCULAR

## 2021-04-29 MED ORDER — TIZANIDINE HCL 4 MG PO TABS
4.0000 mg | ORAL_TABLET | Freq: Four times a day (QID) | ORAL | 0 refills | Status: DC | PRN
  Filled 2021-04-29: qty 30, 8d supply, fill #0

## 2021-04-29 MED ORDER — NORTRIPTYLINE HCL 25 MG PO CAPS
25.0000 mg | ORAL_CAPSULE | Freq: Every day | ORAL | 2 refills | Status: DC
  Filled 2021-04-29: qty 30, 30d supply, fill #0
  Filled 2021-05-22: qty 30, 30d supply, fill #1
  Filled 2021-07-04: qty 30, 30d supply, fill #2

## 2021-04-29 MED ORDER — MELOXICAM 15 MG PO TABS
15.0000 mg | ORAL_TABLET | Freq: Every day | ORAL | 0 refills | Status: DC
  Filled 2021-04-29: qty 30, 30d supply, fill #0

## 2021-04-29 NOTE — Patient Instructions (Addendum)
Ice/cold pack over area for 10-15 min twice daily.  Heat (pad or rice pillow in microwave) over affected area, 10-15 minutes twice daily.   OK to take Tylenol 1000 mg (2 extra strength tabs) or 975 mg (3 regular strength tabs) every 6 hours as needed.  Start the meloxicam tomorrow.   Please consider counseling. Contact [phone] to schedule an appointment or inquire about cost/insurance coverage.  [facility] located at [place], [place], [place].  Phone number = [phone].  Dr. Lennice Sites - Adult Psychiatry.    [facility] located at [address] [place], [place], [place]. Phone number = [phone].   [facility] located at [address] [place], [place].  Phone number = [phone].   [facility] located at [place], [place], [place].  Phone number = [phone].  EXERCISES RANGE OF MOTION (ROM) AND STRETCHING EXERCISES  These exercises may help you when beginning to rehabilitate your issue. In order to successfully resolve your symptoms, you must improve your posture. These exercises are designed to help reduce the forward-head and rounded-shoulder posture which contributes to this condition. Your symptoms may resolve with or without further involvement from your physician, physical therapist or athletic trainer. While completing these exercises, remember:  Restoring tissue flexibility helps normal motion to return to the joints. This allows healthier, less painful movement and activity. An effective stretch should be held for at least 20 seconds, although you may need to begin with shorter hold times for comfort. A stretch should never be painful. You should only feel a gentle lengthening or release in the stretched tissue. Do not do any stretch or exercise that you cannot tolerate.  STRETCH- Axial Extensors Lie on your back on the floor. You may bend your knees for comfort. Place a  rolled-up hand towel or dish towel, about 2 inches in diameter, under the part of your head that makes contact with the floor. Gently tuck your chin, as if trying to make a "double chin," until you feel a gentle stretch at the base of your head. Hold 15-20 seconds. Repeat 2-3 times. Complete this exercise 1 time per day.   STRETCH - Axial Extension  Stand or sit on a firm surface. Assume a good posture: chest up, shoulders drawn back, abdominal muscles slightly tense, knees unlocked (if standing) and feet hip width apart. Slowly retract your chin so your head slides back and your chin slightly lowers. Continue to look straight ahead. You should feel a gentle stretch in the back of your head. Be certain not to feel an aggressive stretch since this can cause headaches later. Hold for 15-20 seconds. Repeat 2-3 times. Complete this exercise 1 time per day.  STRETCH - Cervical Side Bend  Stand or sit on a firm surface. Assume a good posture: chest up, shoulders drawn back, abdominal muscles slightly tense, knees unlocked (if standing) and feet hip width apart. Without letting your nose or shoulders move, slowly tip your right / left ear to your shoulder until your feel a gentle stretch in the muscles on the opposite side of your neck. Hold 15-20 seconds. Repeat 2-3 times. Complete this exercise 1-2 times per day.  STRETCH - Cervical Rotators  Stand or sit on a firm surface. Assume a good posture: chest up, shoulders drawn back, abdominal muscles slightly tense, knees unlocked (if standing) and feet hip width apart. Keeping your eyes level with the ground, slowly turn your head until you feel  a gentle stretch along the back and opposite side of your neck. Hold 15-20 seconds. Repeat 2-3 times. Complete this exercise 1-2 times per day.  RANGE OF MOTION - Neck Circles  Stand or sit on a firm surface. Assume a good posture: chest up, shoulders drawn back, abdominal muscles slightly tense, knees  unlocked (if standing) and feet hip width apart. Gently roll your head down and around from the back of one shoulder to the back of the other. The motion should never be forced or painful. Repeat the motion 10-20 times, or until you feel the neck muscles relax and loosen. Repeat 2-3 times. Complete the exercise 1-2 times per day. STRENGTHENING EXERCISES - Cervical Strain and Sprain These exercises may help you when beginning to rehabilitate your injury. They may resolve your symptoms with or without further involvement from your physician, physical therapist, or athletic trainer. While completing these exercises, remember:  Muscles can gain both the endurance and the strength needed for everyday activities through controlled exercises. Complete these exercises as instructed by your physician, physical therapist, or athletic trainer. Progress the resistance and repetitions only as guided. You may experience muscle soreness or fatigue, but the pain or discomfort you are trying to eliminate should never worsen during these exercises. If this pain does worsen, stop and make certain you are following the directions exactly. If the pain is still present after adjustments, discontinue the exercise until you can discuss the trouble with your clinician.  STRENGTH - Cervical Flexors, Isometric Face a wall, standing about 6 inches away. Place a small pillow, a ball about 6-8 inches in diameter, or a folded towel between your forehead and the wall. Slightly tuck your chin and gently push your forehead into the soft object. Push only with mild to moderate intensity, building up tension gradually. Keep your jaw and forehead relaxed. Hold 10 to 20 seconds. Keep your breathing relaxed. Release the tension slowly. Relax your neck muscles completely before you start the next repetition. Repeat 2-3 times. Complete this exercise 1 time per day.  STRENGTH- Cervical Lateral Flexors, Isometric  Stand about 6 inches away  from a wall. Place a small pillow, a ball about 6-8 inches in diameter, or a folded towel between the side of your head and the wall. Slightly tuck your chin and gently tilt your head into the soft object. Push only with mild to moderate intensity, building up tension gradually. Keep your jaw and forehead relaxed. Hold 10 to 20 seconds. Keep your breathing relaxed. Release the tension slowly. Relax your neck muscles completely before you start the next repetition. Repeat 2-3 times. Complete this exercise 1 time per day.  STRENGTH - Cervical Extensors, Isometric  Stand about 6 inches away from a wall. Place a small pillow, a ball about 6-8 inches in diameter, or a folded towel between the back of your head and the wall. Slightly tuck your chin and gently tilt your head back into the soft object. Push only with mild to moderate intensity, building up tension gradually. Keep your jaw and forehead relaxed. Hold 10 to 20 seconds. Keep your breathing relaxed. Release the tension slowly. Relax your neck muscles completely before you start the next repetition. Repeat 2-3 times. Complete this exercise 1 time per day.  POSTURE AND BODY MECHANICS CONSIDERATIONS Keeping correct posture when sitting, standing or completing your activities will reduce the stress put on different body tissues, allowing injured tissues a chance to heal and limiting painful experiences. The following are general guidelines  for improved posture. Your physician or physical therapist will provide you with any instructions specific to your needs. While reading these guidelines, remember: The exercises prescribed by your provider will help you have the flexibility and strength to maintain correct postures. The correct posture provides the optimal environment for your joints to work. All of your joints have less wear and tear when properly supported by a spine with good posture. This means you will experience a healthier, less painful  body. Correct posture must be practiced with all of your activities, especially prolonged sitting and standing. Correct posture is as important when doing repetitive low-stress activities (typing) as it is when doing a single heavy-load activity (lifting).  PROLONGED STANDING WHILE SLIGHTLY LEANING FORWARD When completing a task that requires you to lean forward while standing in one place for a long time, place either foot up on a stationary 2- to 4-inch high object to help maintain the best posture. When both feet are on the ground, the low back tends to lose its slight inward curve. If this curve flattens (or becomes too large), then the back and your other joints will experience too much stress, fatigue more quickly, and can cause pain.   RESTING POSITIONS Consider which positions are most painful for you when choosing a resting position. If you have pain with flexion-based activities (sitting, bending, stooping, squatting), choose a position that allows you to rest in a less flexed posture. You would want to avoid curling into a fetal position on your side. If your pain worsens with extension-based activities (prolonged standing, working overhead), avoid resting in an extended position such as sleeping on your stomach. Most people will find more comfort when they rest with their spine in a more neutral position, neither too rounded nor too arched. Lying on a non-sagging bed on your side with a pillow between your knees, or on your back with a pillow under your knees will often provide some relief. Keep in mind, being in any one position for a prolonged period of time, no matter how correct your posture, can still lead to stiffness.  WALKING Walk with an upright posture. Your ears, shoulders, and hips should all line up. OFFICE WORK When working at a desk, create an environment that supports good, upright posture. Without extra support, muscles fatigue and lead to excessive strain on joints and other  tissues.  CHAIR: A chair should be able to slide under your desk when your back makes contact with the back of the chair. This allows you to work closely. The chair's height should allow your eyes to be level with the upper part of your monitor and your hands to be slightly lower than your elbows. Body position: Your feet should make contact with the floor. If this is not possible, use a foot rest. Keep your ears over your shoulders. This will reduce stress on your neck and low back.  EXERCISES  RANGE OF MOTION (ROM) AND STRETCHING EXERCISES - Low Back Pain Most people with lower back pain will find that their symptoms get worse with excessive bending forward (flexion) or arching at the lower back (extension). The exercises that will help resolve your symptoms will focus on the opposite motion.  If you have pain, numbness or tingling which travels down into your buttocks, leg or foot, the goal of the therapy is for these symptoms to move closer to your back and eventually resolve. Sometimes, these leg symptoms will get better, but your lower back pain may worsen.  This is often an indication of progress in your rehabilitation. Be very alert to any changes in your symptoms and the activities in which you participated in the 24 hours prior to the change. Sharing this information with your caregiver will allow him or her to most efficiently treat your condition. These exercises may help you when beginning to rehabilitate your injury. Your symptoms may resolve with or without further involvement from your physician, physical therapist or athletic trainer. While completing these exercises, remember:  Restoring tissue flexibility helps normal motion to return to the joints. This allows healthier, less painful movement and activity. An effective stretch should be held for at least 30 seconds. A stretch should never be painful. You should only feel a gentle lengthening or release in the stretched  tissue. FLEXION RANGE OF MOTION AND STRETCHING EXERCISES:  STRETCH - Flexion, Single Knee to Chest  Lie on a firm bed or floor with both legs extended in front of you. Keeping one leg in contact with the floor, bring your opposite knee to your chest. Hold your leg in place by either grabbing behind your thigh or at your knee. Pull until you feel a gentle stretch in your low back. Hold 30 seconds. Slowly release your grasp and repeat the exercise with the opposite side. Repeat 2 times. Complete this exercise 3 times per week.   STRETCH - Flexion, Double Knee to Chest Lie on a firm bed or floor with both legs extended in front of you. Keeping one leg in contact with the floor, bring your opposite knee to your chest. Tense your stomach muscles to support your back and then lift your other knee to your chest. Hold your legs in place by either grabbing behind your thighs or at your knees. Pull both knees toward your chest until you feel a gentle stretch in your low back. Hold 30 seconds. Tense your stomach muscles and slowly return one leg at a time to the floor. Repeat 2 times. Complete this exercise 3 times per week.   STRETCH - Low Trunk Rotation Lie on a firm bed or floor. Keeping your legs in front of you, bend your knees so they are both pointed toward the ceiling and your feet are flat on the floor. Extend your arms out to the side. This will stabilize your upper body by keeping your shoulders in contact with the floor. Gently and slowly drop both knees together to one side until you feel a gentle stretch in your low back. Hold for 30 seconds. Tense your stomach muscles to support your lower back as you bring your knees back to the starting position. Repeat the exercise to the other side. Repeat 2 times. Complete this exercise at least 3 times per week.   EXTENSION RANGE OF MOTION AND FLEXIBILITY EXERCISES:  STRETCH - Extension, Prone on Elbows  Lie on your stomach on the floor, a bed  will be too soft. Place your palms about shoulder width apart and at the height of your head. Place your elbows under your shoulders. If this is too painful, stack pillows under your chest. Allow your body to relax so that your hips drop lower and make contact more completely with the floor. Hold this position for 30 seconds. Slowly return to lying flat on the floor. Repeat 2 times. Complete this exercise 3 times per week.   RANGE OF MOTION - Extension, Prone Press Ups Lie on your stomach on the floor, a bed will be too soft. Place your  palms about shoulder width apart and at the height of your head. Keeping your back as relaxed as possible, slowly straighten your elbows while keeping your hips on the floor. You may adjust the placement of your hands to maximize your comfort. As you gain motion, your hands will come more underneath your shoulders. Hold this position 30 seconds. Slowly return to lying flat on the floor. Repeat 2 times. Complete this exercise 3 times per week.   RANGE OF MOTION- Quadruped, Neutral Spine  Assume a hands and knees position on a firm surface. Keep your hands under your shoulders and your knees under your hips. You may place padding under your knees for comfort. Drop your head and point your tailbone toward the ground below you. This will round out your lower back like an angry cat. Hold this position for 30 seconds. Slowly lift your head and release your tail bone so that your back sags into a large arch, like an old horse. Hold this position for 30 seconds. Repeat this until you feel limber in your low back. Now, find your "sweet spot." This will be the most comfortable position somewhere between the two previous positions. This is your neutral spine. Once you have found this position, tense your stomach muscles to support your low back. Hold this position for 30 seconds. Repeat 2 times. Complete this exercise 3 times per week.   STRENGTHENING EXERCISES - Low  Back Sprain These exercises may help you when beginning to rehabilitate your injury. These exercises should be done near your "sweet spot." This is the neutral, low-back arch, somewhere between fully rounded and fully arched, that is your least painful position. When performed in this safe range of motion, these exercises can be used for people who have either a flexion or extension based injury. These exercises may resolve your symptoms with or without further involvement from your physician, physical therapist or athletic trainer. While completing these exercises, remember:  Muscles can gain both the endurance and the strength needed for everyday activities through controlled exercises. Complete these exercises as instructed by your physician, physical therapist or athletic trainer. Increase the resistance and repetitions only as guided. You may experience muscle soreness or fatigue, but the pain or discomfort you are trying to eliminate should never worsen during these exercises. If this pain does worsen, stop and make certain you are following the directions exactly. If the pain is still present after adjustments, discontinue the exercise until you can discuss the trouble with your caregiver.  STRENGTHENING - Deep Abdominals, Pelvic Tilt  Lie on a firm bed or floor. Keeping your legs in front of you, bend your knees so they are both pointed toward the ceiling and your feet are flat on the floor. Tense your lower abdominal muscles to press your low back into the floor. This motion will rotate your pelvis so that your tail bone is scooping upwards rather than pointing at your feet or into the floor. With a gentle tension and even breathing, hold this position for 3 seconds. Repeat 2 times. Complete this exercise 3 times per week.   STRENGTHENING - Abdominals, Crunches  Lie on a firm bed or floor. Keeping your legs in front of you, bend your knees so they are both pointed toward the ceiling and your  feet are flat on the floor. Cross your arms over your chest. Slightly tip your chin down without bending your neck. Tense your abdominals and slowly lift your trunk high enough to just clear  your shoulder blades. Lifting higher can put excessive stress on the lower back and does not further strengthen your abdominal muscles. Control your return to the starting position. Repeat 2 times. Complete this exercise 3 times per week.   STRENGTHENING - Quadruped, Opposite UE/LE Lift  Assume a hands and knees position on a firm surface. Keep your hands under your shoulders and your knees under your hips. You may place padding under your knees for comfort. Find your neutral spine and gently tense your abdominal muscles so that you can maintain this position. Your shoulders and hips should form a rectangle that is parallel with the floor and is not twisted. Keeping your trunk steady, lift your right hand no higher than your shoulder and then your left leg no higher than your hip. Make sure you are not holding your breath. Hold this position for 30 seconds. Continuing to keep your abdominal muscles tense and your back steady, slowly return to your starting position. Repeat with the opposite arm and leg. Repeat 2 times. Complete this exercise 3 times per week.   STRENGTHENING - Abdominals and Quadriceps, Straight Leg Raise  Lie on a firm bed or floor with both legs extended in front of you. Keeping one leg in contact with the floor, bend the other knee so that your foot can rest flat on the floor. Find your neutral spine, and tense your abdominal muscles to maintain your spinal position throughout the exercise. Slowly lift your straight leg off the floor about 6 inches for a count of 3, making sure to not hold your breath. Still keeping your neutral spine, slowly lower your leg all the way to the floor. Repeat this exercise with each leg 2 times. Complete this exercise 3 times per week.  POSTURE AND BODY  MECHANICS CONSIDERATIONS - Low Back Sprain Keeping correct posture when sitting, standing or completing your activities will reduce the stress put on different body tissues, allowing injured tissues a chance to heal and limiting painful experiences. The following are general guidelines for improved posture.  While reading these guidelines, remember: The exercises prescribed by your provider will help you have the flexibility and strength to maintain correct postures. The correct posture provides the best environment for your joints to work. All of your joints have less wear and tear when properly supported by a spine with good posture. This means you will experience a healthier, less painful body. Correct posture must be practiced with all of your activities, especially prolonged sitting and standing. Correct posture is as important when doing repetitive low-stress activities (typing) as it is when doing a single heavy-load activity (lifting).  RESTING POSITIONS Consider which positions are most painful for you when choosing a resting position. If you have pain with flexion-based activities (sitting, bending, stooping, squatting), choose a position that allows you to rest in a less flexed posture. You would want to avoid curling into a fetal position on your side. If your pain worsens with extension-based activities (prolonged standing, working overhead), avoid resting in an extended position such as sleeping on your stomach. Most people will find more comfort when they rest with their spine in a more neutral position, neither too rounded nor too arched. Lying on a non-sagging bed on your side with a pillow between your knees, or on your back with a pillow under your knees will often provide some relief. Keep in mind, being in any one position for a prolonged period of time, no matter how correct your posture, can  still lead to stiffness.  PROPER SITTING POSTURE In order to minimize stress and discomfort  on your spine, you must sit with correct posture. Sitting with good posture should be effortless for a healthy body. Returning to good posture is a gradual process. Many people can work toward this most comfortably by using various supports until they have the flexibility and strength to maintain this posture on their own. When sitting with proper posture, your ears will fall over your shoulders and your shoulders will fall over your hips. You should use the back of the chair to support your upper back. Your lower back will be in a neutral position, just slightly arched. You may place a small pillow or folded towel at the base of your lower back for  support.  When working at a desk, create an environment that supports good, upright posture. Without extra support, muscles tire, which leads to excessive strain on joints and other tissues. Keep these recommendations in mind:  CHAIR: A chair should be able to slide under your desk when your back makes contact with the back of the chair. This allows you to work closely. The chair's height should allow your eyes to be level with the upper part of your monitor and your hands to be slightly lower than your elbows.  BODY POSITION Your feet should make contact with the floor. If this is not possible, use a foot rest. Keep your ears over your shoulders. This will reduce stress on your neck and low back.  INCORRECT SITTING POSTURES  If you are feeling tired and unable to assume a healthy sitting posture, do not slouch or slump. This puts excessive strain on your back tissues, causing more damage and pain. Healthier options include: Using more support, like a lumbar pillow. Switching tasks to something that requires you to be upright or walking. Talking a brief walk. Lying down to rest in a neutral-spine position.  PROLONGED STANDING WHILE SLIGHTLY LEANING FORWARD  When completing a task that requires you to lean forward while standing in one place for a  long time, place either foot up on a stationary 2-4 inch high object to help maintain the best posture. When both feet are on the ground, the lower back tends to lose its slight inward curve. If this curve flattens (or becomes too large), then the back and your other joints will experience too much stress, tire more quickly, and can cause pain.  CORRECT STANDING POSTURES Proper standing posture should be assumed with all daily activities, even if they only take a few moments, like when brushing your teeth. As in sitting, your ears should fall over your shoulders and your shoulders should fall over your hips. You should keep a slight tension in your abdominal muscles to brace your spine. Your tailbone should point down to the ground, not behind your body, resulting in an over-extended swayback posture.   INCORRECT STANDING POSTURES  Common incorrect standing postures include a forward head, locked knees and/or an excessive swayback. WALKING Walk with an upright posture. Your ears, shoulders and hips should all line-up.  PROLONGED ACTIVITY IN A FLEXED POSITION When completing a task that requires you to bend forward at your waist or lean over a low surface, try to find a way to stabilize 3 out of 4 of your limbs. You can place a hand or elbow on your thigh or rest a knee on the surface you are reaching across. This will provide you more stability, so that your  muscles do not tire as quickly. By keeping your knees relaxed, or slightly bent, you will also reduce stress across your lower back. CORRECT LIFTING TECHNIQUES  DO : Assume a wide stance. This will provide you more stability and the opportunity to get as close as possible to the object which you are lifting. Tense your abdominals to brace your spine. Bend at the knees and hips. Keeping your back locked in a neutral-spine position, lift using your leg muscles. Lift with your legs, keeping your back straight. Test the weight of unknown objects  before attempting to lift them. Try to keep your elbows locked down at your sides in order get the best strength from your shoulders when carrying an object.   Always ask for help when lifting heavy or awkward objects. INCORRECT LIFTING TECHNIQUES DO NOT:  Lock your knees when lifting, even if it is a small object. Bend and twist. Pivot at your feet or move your feet when needing to change directions. Assume that you can safely pick up even a paperclip without proper posture.
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
## 2021-05-05 ENCOUNTER — Encounter: Payer: Self-pay | Admitting: Family Medicine

## 2021-05-09 ENCOUNTER — Ambulatory Visit: Payer: 59 | Admitting: Family Medicine

## 2021-05-10 ENCOUNTER — Encounter: Payer: Self-pay | Admitting: Physical Medicine and Rehabilitation

## 2021-05-11 ENCOUNTER — Other Ambulatory Visit (HOSPITAL_BASED_OUTPATIENT_CLINIC_OR_DEPARTMENT_OTHER): Payer: Self-pay

## 2021-05-11 ENCOUNTER — Ambulatory Visit: Payer: 59 | Admitting: Family Medicine

## 2021-05-11 ENCOUNTER — Encounter: Payer: Self-pay | Admitting: Family Medicine

## 2021-05-11 ENCOUNTER — Ambulatory Visit (INDEPENDENT_AMBULATORY_CARE_PROVIDER_SITE_OTHER): Payer: 59 | Admitting: Family Medicine

## 2021-05-11 VITALS — BP 135/82 | HR 97 | Temp 97.9°F | Resp 16 | Ht 75.0 in | Wt 372.0 lb

## 2021-05-11 DIAGNOSIS — F418 Other specified anxiety disorders: Secondary | ICD-10-CM | POA: Diagnosis not present

## 2021-05-11 DIAGNOSIS — G629 Polyneuropathy, unspecified: Secondary | ICD-10-CM | POA: Diagnosis not present

## 2021-05-11 DIAGNOSIS — M542 Cervicalgia: Secondary | ICD-10-CM

## 2021-05-11 DIAGNOSIS — M545 Low back pain, unspecified: Secondary | ICD-10-CM

## 2021-05-11 DIAGNOSIS — E1169 Type 2 diabetes mellitus with other specified complication: Secondary | ICD-10-CM | POA: Diagnosis not present

## 2021-05-11 DIAGNOSIS — E669 Obesity, unspecified: Secondary | ICD-10-CM

## 2021-05-11 DIAGNOSIS — G8929 Other chronic pain: Secondary | ICD-10-CM

## 2021-05-11 LAB — MICROALBUMIN / CREATININE URINE RATIO
Creatinine,U: 216.1 mg/dL
Microalb Creat Ratio: 2.6 mg/g (ref 0.0–30.0)
Microalb, Ur: 5.6 mg/dL — ABNORMAL HIGH (ref 0.0–1.9)

## 2021-05-11 LAB — COMPREHENSIVE METABOLIC PANEL
ALT: 36 U/L (ref 0–53)
AST: 23 U/L (ref 0–37)
Albumin: 4.4 g/dL (ref 3.5–5.2)
Alkaline Phosphatase: 36 U/L — ABNORMAL LOW (ref 39–117)
BUN: 21 mg/dL (ref 6–23)
CO2: 27 mEq/L (ref 19–32)
Calcium: 10.5 mg/dL (ref 8.4–10.5)
Chloride: 99 mEq/L (ref 96–112)
Creatinine, Ser: 0.74 mg/dL (ref 0.40–1.50)
GFR: 105.7 mL/min (ref >60.00)
Glucose, Bld: 98 mg/dL (ref 70–99)
Potassium: 4.1 mEq/L (ref 3.5–5.1)
Sodium: 135 mEq/L (ref 135–145)
Total Bilirubin: 0.8 mg/dL (ref 0.2–1.2)
Total Protein: 7.9 g/dL (ref 6.0–8.3)

## 2021-05-11 LAB — CBC
HCT: 51.8 % (ref 39.0–52.0)
Hemoglobin: 17.2 g/dL — ABNORMAL HIGH (ref 13.0–17.0)
MCHC: 33.3 g/dL (ref 30.0–36.0)
MCV: 104 fl — ABNORMAL HIGH (ref 78.0–100.0)
Platelets: 229 10*3/uL (ref 150.0–400.0)
RBC: 4.98 Mil/uL (ref 4.22–5.81)
RDW: 13.7 % (ref 11.5–15.5)
WBC: 4 10*3/uL (ref 4.0–10.5)

## 2021-05-11 LAB — LIPID PANEL
Cholesterol: 121 mg/dL (ref 0–200)
HDL: 34.5 mg/dL — ABNORMAL LOW (ref >39.00)
LDL Cholesterol: 65 mg/dL (ref 0–99)
NonHDL: 86.89
Total CHOL/HDL Ratio: 4
Triglycerides: 108 mg/dL (ref 0.0–149.0)
VLDL: 21.6 mg/dL (ref 0.0–40.0)

## 2021-05-11 LAB — HEMOGLOBIN A1C: Hgb A1c MFr Bld: 5.6 % (ref 4.6–6.5)

## 2021-05-11 MED ORDER — TRAMADOL HCL 50 MG PO TABS
50.0000 mg | ORAL_TABLET | Freq: Two times a day (BID) | ORAL | 1 refills | Status: DC | PRN
  Filled 2021-05-11: qty 60, 30d supply, fill #0
  Filled 2021-11-04: qty 60, 30d supply, fill #1

## 2021-05-11 MED ORDER — BUSPIRONE HCL 7.5 MG PO TABS
7.5000 mg | ORAL_TABLET | Freq: Two times a day (BID) | ORAL | 2 refills | Status: DC
  Filled 2021-05-11: qty 60, 30d supply, fill #0
  Filled 2021-06-20: qty 60, 30d supply, fill #1

## 2021-05-11 NOTE — Patient Instructions (Addendum)
Keep working hard with physical therapy. I wouldn't see a chiropractor at this time.  ? ?Give [place] 2-3 business days to get the results of your labs back.  ? ?Keep the diet clean and stay active. ? ?Let us know if you need anything. ?
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
## 2021-05-22 ENCOUNTER — Other Ambulatory Visit: Payer: Self-pay | Admitting: Family Medicine

## 2021-05-22 DIAGNOSIS — S161XXA Strain of muscle, fascia and tendon at neck level, initial encounter: Secondary | ICD-10-CM

## 2021-05-22 DIAGNOSIS — M545 Low back pain, unspecified: Secondary | ICD-10-CM

## 2021-05-23 ENCOUNTER — Other Ambulatory Visit (HOSPITAL_BASED_OUTPATIENT_CLINIC_OR_DEPARTMENT_OTHER): Payer: Self-pay

## 2021-05-23 MED ORDER — MELOXICAM 15 MG PO TABS
15.0000 mg | ORAL_TABLET | Freq: Every day | ORAL | 0 refills | Status: DC
  Filled 2021-05-23: qty 30, 30d supply, fill #0

## 2021-05-23 MED ORDER — TIZANIDINE HCL 4 MG PO TABS
4.0000 mg | ORAL_TABLET | Freq: Four times a day (QID) | ORAL | 0 refills | Status: DC | PRN
  Filled 2021-05-23: qty 30, 8d supply, fill #0

## 2021-05-31 ENCOUNTER — Ambulatory Visit: Payer: 59 | Admitting: Family Medicine

## 2021-06-02 ENCOUNTER — Telehealth: Payer: Self-pay | Admitting: Family Medicine

## 2021-06-02 NOTE — Telephone Encounter (Signed)
[facility] is requestiong orders for physical therapy.  ?2x for 2 weeks ?1x for 2 weeks ? ?She also wanted to mention pt's nortriptyline  interacts with traMADol.  ?

## 2021-06-03 ENCOUNTER — Encounter: Payer: Self-pay | Admitting: Family Medicine

## 2021-06-03 ENCOUNTER — Other Ambulatory Visit (HOSPITAL_BASED_OUTPATIENT_CLINIC_OR_DEPARTMENT_OTHER): Payer: Self-pay

## 2021-06-03 ENCOUNTER — Ambulatory Visit (INDEPENDENT_AMBULATORY_CARE_PROVIDER_SITE_OTHER): Payer: 59 | Admitting: Family Medicine

## 2021-06-03 VITALS — BP 126/84 | HR 93 | Temp 97.6°F | Resp 18 | Ht 75.0 in

## 2021-06-03 DIAGNOSIS — M545 Low back pain, unspecified: Secondary | ICD-10-CM | POA: Diagnosis not present

## 2021-06-03 DIAGNOSIS — F418 Other specified anxiety disorders: Secondary | ICD-10-CM | POA: Diagnosis not present

## 2021-06-03 DIAGNOSIS — G8929 Other chronic pain: Secondary | ICD-10-CM

## 2021-06-03 DIAGNOSIS — E1169 Type 2 diabetes mellitus with other specified complication: Secondary | ICD-10-CM | POA: Diagnosis not present

## 2021-06-03 DIAGNOSIS — E669 Obesity, unspecified: Secondary | ICD-10-CM

## 2021-06-03 MED ORDER — DIAZEPAM 10 MG PO TABS
ORAL_TABLET | ORAL | 0 refills | Status: DC
  Filled 2021-06-03: qty 2, 1d supply, fill #0

## 2021-06-03 NOTE — Telephone Encounter (Signed)
OK. I am aware of interaction.  ?

## 2021-06-03 NOTE — Patient Instructions (Addendum)
Please consider counseling. Contact [phone] to schedule an appointment or inquire about cost/insurance coverage. ? ?[facility] located at [address] [place], [place], [place].  Phone number = [phone].  Dr. Lennice Sites - Adult Psychiatry.  ?  ?[facility] located at [address], [place], [place]. Phone number = [phone]. ?  ?[facility] located at [address], [place], [place].  Phone number = [phone]. ?  ?[facility] located at [place], [place], [place].  Phone number = [phone]. ? ?OK to take Tylenol 1000 mg (2 extra strength tabs) or 975 mg (3 regular strength tabs) every 6 hours as needed. ? ?If you do not hear anything about your referral in the next 1-2 weeks, call our office and ask for an update. ? ?Coping skills ?Choose 5 that work for you: ?Take a deep breath ?Count to 20 ?Read a book ?Do a puzzle ?Meditate ?Bake ?Sing ?Knit ?Garden ?Pray ?Go outside ?Call a friend ?Listen to music ?Take a walk ?Color ?Send a note ?Take a bath ?Watch a movie ?Be alone in a quiet place ?Pet an animal ?Visit a friend ?Journal ?Exercise ?Stretch  ? ?Someone will reach out regarding your MRI. Have someone drive you to your appointment. ? ?[facility] ?[address] ?[place], [place] [address] ?[phone] ? ?[facility] ?[address] ?[place], [place] [address] ?[phone] ? ?[facility] ?[place], [place] [address] ?[phone] ? ?[facility] ?[place], [place], [place], [address][phone] ?[address], [place], [place], #[phone] ? ?[facility] ?[address] ?[phone] ? ?[facility] ?[place] ?[place], [place] ?[phone] ? ?[facility] ?[address] ?[place], [place] ?[phone] ? ?[facility] ?[address] ?[place], [place]   ?[phone].   ?Please go online to complete a form to submit first. ? ?[facility]  ?[address] [place], [place] ?[phone].   ?  ?[facility] -  local practices located at: ?[address], [place], [place]. [phone]. ?[place], [place], [place].  [phone]. ?[address], [place], [place], [place].  [phone]. ? ?Contact one of these offices sooner than later as it can take 2-3 months to get a new patient appointment.  ? ?Let us know if you need anything. ? ?
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
## 2021-06-03 NOTE — Telephone Encounter (Signed)
Please advise 

## 2021-06-03 NOTE — Telephone Encounter (Signed)
Called Mark Rich , lvm to return call since voicemail doesn't say its secure ?

## 2021-06-06 ENCOUNTER — Other Ambulatory Visit (HOSPITAL_BASED_OUTPATIENT_CLINIC_OR_DEPARTMENT_OTHER): Payer: Self-pay

## 2021-06-07 NOTE — Telephone Encounter (Signed)
HH informed of PCP verbal ok per orders and ok interaction of medications. ?

## 2021-06-09 ENCOUNTER — Other Ambulatory Visit (HOSPITAL_BASED_OUTPATIENT_CLINIC_OR_DEPARTMENT_OTHER): Payer: Self-pay

## 2021-06-09 ENCOUNTER — Telehealth: Payer: Self-pay | Admitting: Family Medicine

## 2021-06-09 ENCOUNTER — Ambulatory Visit (AMBULATORY_SURGERY_CENTER): Payer: 59 | Admitting: *Deleted

## 2021-06-09 VITALS — Ht 74.0 in | Wt 360.0 lb

## 2021-06-09 DIAGNOSIS — Z1211 Encounter for screening for malignant neoplasm of colon: Secondary | ICD-10-CM

## 2021-06-09 MED ORDER — PEG 3350-KCL-NA BICARB-NACL 420 G PO SOLR
4000.0000 mL | Freq: Once | ORAL | 0 refills | Status: AC
  Filled 2021-06-09: qty 4000, 1d supply, fill #0
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
## 2021-06-09 NOTE — Progress Notes (Signed)

## 2021-06-10 ENCOUNTER — Other Ambulatory Visit (HOSPITAL_BASED_OUTPATIENT_CLINIC_OR_DEPARTMENT_OTHER): Payer: Self-pay

## 2021-06-13 ENCOUNTER — Telehealth: Payer: Self-pay | Admitting: Family Medicine

## 2021-06-13 ENCOUNTER — Other Ambulatory Visit: Payer: Self-pay | Admitting: Family Medicine

## 2021-06-13 DIAGNOSIS — F418 Other specified anxiety disorders: Secondary | ICD-10-CM

## 2021-06-13 NOTE — Telephone Encounter (Signed)
Spoke to the patient and his attorney has found a place for him to go for his MRI. ?Scheduled a 3 week follow-up with PCP ?

## 2021-06-13 NOTE — Telephone Encounter (Signed)
Pt called back with information on referral he is requesting:  [facility] with [facility], [address] [place], [place] [address], [phone]. ?

## 2021-06-13 NOTE — Telephone Encounter (Signed)
Referral done

## 2021-06-13 NOTE — Telephone Encounter (Signed)
Patient needs a referral but will call back with demographics  ?

## 2021-06-13 NOTE — Telephone Encounter (Signed)
Called the patient informed of PCP response ?He verbalize understanding. ?
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
## 2021-06-14 ENCOUNTER — Other Ambulatory Visit (HOSPITAL_BASED_OUTPATIENT_CLINIC_OR_DEPARTMENT_OTHER): Payer: Self-pay

## 2021-06-20 ENCOUNTER — Other Ambulatory Visit (HOSPITAL_BASED_OUTPATIENT_CLINIC_OR_DEPARTMENT_OTHER): Payer: Self-pay

## 2021-06-24 ENCOUNTER — Encounter: Payer: Self-pay | Admitting: Family Medicine

## 2021-06-24 ENCOUNTER — Ambulatory Visit (INDEPENDENT_AMBULATORY_CARE_PROVIDER_SITE_OTHER): Payer: 59 | Admitting: Family Medicine

## 2021-06-24 VITALS — BP 122/84 | HR 89 | Temp 98.1°F | Ht 74.0 in | Wt 326.4 lb

## 2021-06-24 DIAGNOSIS — F4310 Post-traumatic stress disorder, unspecified: Secondary | ICD-10-CM | POA: Insufficient documentation

## 2021-06-24 DIAGNOSIS — M545 Low back pain, unspecified: Secondary | ICD-10-CM

## 2021-06-24 DIAGNOSIS — G8929 Other chronic pain: Secondary | ICD-10-CM

## 2021-06-24 NOTE — Patient Instructions (Addendum)
Continue with the therapy team.  ? ?Dr. Erling Cruz: [phone] ? ?Very strong work with your weight loss. ? ?Let me know if you change your mind about adjusting the medication.  ? ?We've order the MRI for here since you are under 350 lbs now.  ? ?Let us know if you need anything. ?
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
## 2021-06-28 ENCOUNTER — Telehealth: Payer: Self-pay | Admitting: Family Medicine

## 2021-06-28 NOTE — Telephone Encounter (Signed)
Called HH informed of verbal ok per PCP. 

## 2021-06-28 NOTE — Telephone Encounter (Signed)
Caller/Agency: [facility] ?Callback Number: [phone] ?Requesting OT/PT/Skilled Nursing/Social Work/Speech Therapy: PT ?Frequency: 1x for 4 weeks  ?

## 2021-06-30 ENCOUNTER — Ambulatory Visit (AMBULATORY_SURGERY_CENTER): Payer: 59 | Admitting: Internal Medicine

## 2021-06-30 ENCOUNTER — Encounter: Payer: Self-pay | Admitting: Internal Medicine

## 2021-06-30 VITALS — BP 111/67 | HR 77 | Temp 96.8°F | Resp 11 | Ht 75.0 in | Wt 360.0 lb

## 2021-06-30 DIAGNOSIS — Z1211 Encounter for screening for malignant neoplasm of colon: Secondary | ICD-10-CM

## 2021-06-30 DIAGNOSIS — D122 Benign neoplasm of ascending colon: Secondary | ICD-10-CM

## 2021-06-30 MED ORDER — SODIUM CHLORIDE 0.9 % IV SOLN: 500.0000 mL | Freq: Once | INTRAVENOUS | Status: DC

## 2021-06-30 NOTE — Patient Instructions (Signed)
Thank you for coming in to see [place] today. ?Resume previous diet and medications today. ?Return to normal daily activities tomorrow. ?Recommend next surveillance colonoscopy in 7-10 years, based on the biopsy result of the polyp removed today. ? ? ? ?YOU HAD AN ENDOSCOPIC PROCEDURE TODAY AT [facility]:   Refer to the procedure report that was given to you for any specific questions about what was found during the examination.  If the procedure report does not answer your questions, please call your gastroenterologist to clarify.  If you requested that your care partner not be given the details of your procedure findings, then the procedure report has been included in a sealed envelope for you to review at your convenience later. ? ?YOU SHOULD EXPECT: Some feelings of bloating in the abdomen. Passage of more gas than usual.  Walking can help get rid of the air that was put into your GI tract during the procedure and reduce the bloating. If you had a lower endoscopy (such as a colonoscopy or flexible sigmoidoscopy) you may notice spotting of blood in your stool or on the toilet paper. If you underwent a bowel prep for your procedure, you may not have a normal bowel movement for a few days. ? ?Please Note:  You might notice some irritation and congestion in your nose or some drainage.  This is from the oxygen used during your procedure.  There is no need for concern and it should clear up in a day or so. ? ?SYMPTOMS TO REPORT IMMEDIATELY: ? ?Following lower endoscopy (colonoscopy or flexible sigmoidoscopy): ? Excessive amounts of blood in the stool ? Significant tenderness or worsening of abdominal pains ? Swelling of the abdomen that is new, acute ? Fever of 100?F or higher ? ? ? ?For urgent or emergent issues, a gastroenterologist can be reached at any hour by calling [phone]. ?Do not use MyChart messaging for urgent concerns.  ? ? ?DIET:  We do recommend a small meal at first, but then you may  proceed to your regular diet.  Drink plenty of fluids but you should avoid alcoholic beverages for 24 hours. ? ?ACTIVITY:  You should plan to take it easy for the rest of today and you should NOT DRIVE or use heavy machinery until tomorrow (because of the sedation medicines used during the test).   ? ?FOLLOW UP: ?Our staff will call the number listed on your records 48-72 hours following your procedure to check on you and address any questions or concerns that you may have regarding the information given to you following your procedure. If we do not reach you, we will leave a message.  We will attempt to reach you two times.  During this call, we will ask if you have developed any symptoms of COVID 19. If you develop any symptoms (ie: fever, flu-like symptoms, shortness of breath, cough etc.) before then, please call [phone].  If you test positive for Covid 19 in the 2 weeks post procedure, please call and report this information to [place].   ? ?If any biopsies were taken you will be contacted by phone or by letter within the next 1-3 weeks.  Please call us at [phone] if you have not heard about the biopsies in 3 weeks.  ? ? ?SIGNATURES/CONFIDENTIALITY: ?You and/or your care partner have signed paperwork which will be entered into your electronic medical record.  These signatures attest to the fact that that the information above on your After Visit  Summary has been reviewed and is understood.  Full responsibility of the confidentiality of this discharge information lies with you and/or your care-partner.  ?
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
## 2021-06-30 NOTE — Progress Notes (Signed)
PT taken to PACU. Monitors in place. VSS. Report given to RN.  ? ?Mark Rich vomited approximately 15m of blood tinged fluid during the procedure. Oral suctioning was performed. No dental, or soft tissue damage noted. Aspiration risks and appropriate measures were discussed with the patient, and his caregiver.  ?

## 2021-06-30 NOTE — Progress Notes (Signed)
Called to room to assist during endoscopic procedure.  Patient ID and intended procedure confirmed with present staff. Received instructions for my participation in the procedure from the performing physician.  

## 2021-06-30 NOTE — Op Note (Signed)
[facility] ?Patient Name: Mark Rich ?Procedure Date: 06/30/2021 9:35 AM ?MRN: [id number] ?Endoscopist: Docia Chuck. Henrene Pastor , MD ?Age: 51 ?Referring MD:  ?Date of Birth: 11/26/1970 ?Gender: Male ?Account #: [id number] ?Procedure:                Colonoscopy with cold snare polypectomy x 1 ?Indications:              Screening for colorectal malignant neoplasm ?Medicines:                Monitored Anesthesia Care ?Procedure:                Pre-Anesthesia Assessment: ?                          - Prior to the procedure, a History and Physical  ?                          was performed, and patient medications and  ?                          allergies were reviewed. The patient's tolerance of  ?                          previous anesthesia was also reviewed. The risks  ?                          and benefits of the procedure and the sedation  ?                          options and risks were discussed with the patient.  ?                          All questions were answered, and informed consent  ?                          was obtained. Prior Anticoagulants: The patient has  ?                          taken no previous anticoagulant or antiplatelet  ?                          agents. ASA Grade Assessment: II - A patient with  ?                          mild systemic disease. After reviewing the risks  ?                          and benefits, the patient was deemed in  ?                          satisfactory condition to undergo the procedure. ?                          After obtaining informed consent, the colonoscope  ?  was passed under direct vision. Throughout the  ?                          procedure, the patient's blood pressure, pulse, and  ?                          oxygen saturations were monitored continuously. The  ?                          CF [id number] #[id number] was introduced through the anus  ?                          and advanced to the the cecum, identified by  ?                           appendiceal orifice and ileocecal valve. The  ?                          ileocecal valve, appendiceal orifice, and rectum  ?                          were photographed. The quality of the bowel  ?                          preparation was excellent. The colonoscopy was  ?                          performed without difficulty. The patient tolerated  ?                          the procedure well. The bowel preparation used was  ?                          GoLYTELY via split dose instruction. ?Scope In: 9:54:38 AM ?Scope Out: 10:10:21 AM ?Scope Withdrawal Time: 0 hours 12 minutes 48 seconds  ?Total Procedure Duration: 0 hours 15 minutes 43 seconds  ?Findings:                 A 3 mm polyp was found in the ascending colon. The  ?                          polyp was removed with a cold snare. Resection and  ?                          retrieval were complete. ?                          The exam was otherwise without abnormality on  ?                          direct and retroflexion views. ?Complications:            No immediate complications. Estimated blood loss:  ?  None. ?Estimated Blood Loss:     Estimated blood loss: none. ?Impression:               - One 3 mm polyp in the ascending colon, removed  ?                          with a cold snare. Resected and retrieved. ?                          - The examination was otherwise normal on direct  ?                          and retroflexion views. ?Recommendation:           - Repeat colonoscopy in 7-10 years for surveillance. ?                          - Patient has a contact number available for  ?                          emergencies. The signs and symptoms of potential  ?                          delayed complications were discussed with the  ?                          patient. Return to normal activities tomorrow.  ?                          Written discharge instructions were provided to the  ?                          patient. ?                           - Resume previous diet. ?                          - Continue present medications. ?                          - Await pathology results. ?Docia Chuck. Henrene Pastor, MD ?06/30/2021 10:16:45 AM ?This report has been signed electronically. ?

## 2021-07-04 ENCOUNTER — Telehealth: Payer: Self-pay | Admitting: Family Medicine

## 2021-07-04 ENCOUNTER — Telehealth: Payer: Self-pay

## 2021-07-04 ENCOUNTER — Other Ambulatory Visit: Payer: Self-pay | Admitting: Family Medicine

## 2021-07-04 ENCOUNTER — Encounter: Payer: Self-pay | Admitting: Internal Medicine

## 2021-07-04 ENCOUNTER — Other Ambulatory Visit (HOSPITAL_BASED_OUTPATIENT_CLINIC_OR_DEPARTMENT_OTHER): Payer: Self-pay

## 2021-07-04 DIAGNOSIS — M545 Low back pain, unspecified: Secondary | ICD-10-CM

## 2021-07-04 DIAGNOSIS — S161XXA Strain of muscle, fascia and tendon at neck level, initial encounter: Secondary | ICD-10-CM

## 2021-07-04 MED ORDER — TIZANIDINE HCL 4 MG PO TABS
4.0000 mg | ORAL_TABLET | Freq: Four times a day (QID) | ORAL | 0 refills | Status: DC | PRN
  Filled 2021-07-04: qty 30, 8d supply, fill #0

## 2021-07-04 MED ORDER — MELOXICAM 15 MG PO TABS
15.0000 mg | ORAL_TABLET | Freq: Every day | ORAL | 0 refills | Status: DC
  Filled 2021-07-04: qty 30, 30d supply, fill #0

## 2021-07-04 NOTE — Telephone Encounter (Signed)
Called the patient informed he would need to sign a release of records for to give [place] permission to fax over the order. ?

## 2021-07-04 NOTE — Telephone Encounter (Signed)
?  Follow up Call- ? ? ?  06/30/2021  ?  8:34 AM  ?Call back number  ?Post procedure Call Back phone  # [phone]  ?Permission to leave phone message Yes  ?  ? ?Patient questions: ? ?Do you have a fever, pain , or abdominal swelling? No. ?Pain Score  0 * ? ?Have you tolerated food without any problems? Yes.   ? ?Have you been able to return to your normal activities? Yes.   ? ?Do you have any questions about your discharge instructions: ?Diet   No. ?Medications  No. ?Follow up visit  No. ? ?Do you have questions or concerns about your Care? No. ? ?Actions: ?* If pain score is 4 or above: ?No action needed, pain <4. ? ? ?
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
## 2021-07-04 NOTE — Telephone Encounter (Signed)
Called the patient back to inform he would need to find out more specifics from attorney office they were needing and they would need to fax over something with specifics of what they needed. ? ?

## 2021-07-04 NOTE — Telephone Encounter (Signed)
Is this ok to do?

## 2021-07-05 NOTE — Telephone Encounter (Signed)
Called Hailey back and gave her the number for HIM at [phone] for this request. ?She agreed to call them. ?

## 2021-07-05 NOTE — Telephone Encounter (Signed)
Mark Rich called to get a status update. She can be reached at [phone] ?

## 2021-08-12 ENCOUNTER — Telehealth: Payer: Self-pay | Admitting: Family Medicine

## 2021-08-12 DIAGNOSIS — M545 Low back pain, unspecified: Secondary | ICD-10-CM

## 2021-08-12 NOTE — Telephone Encounter (Signed)
Mark Rich [phone]  Pt states he needs a prescription for outpatient physical therapy sent to [phone]. He states he has been getting in house treatment but now he needs a referral for outpatient. Please advise.

## 2021-08-15 NOTE — Telephone Encounter (Signed)
Referral done

## 2021-08-15 NOTE — Telephone Encounter (Signed)
Called to get the name

## 2021-08-15 NOTE — Telephone Encounter (Signed)
Ok to refer to [facility].  The patient states to send to [facility] but they do come into the home

## 2021-08-23 ENCOUNTER — Other Ambulatory Visit (HOSPITAL_BASED_OUTPATIENT_CLINIC_OR_DEPARTMENT_OTHER): Payer: Self-pay

## 2021-08-23 ENCOUNTER — Telehealth: Payer: Self-pay | Admitting: Family Medicine

## 2021-08-23 DIAGNOSIS — M545 Low back pain, unspecified: Secondary | ICD-10-CM

## 2021-08-23 DIAGNOSIS — E1169 Type 2 diabetes mellitus with other specified complication: Secondary | ICD-10-CM

## 2021-08-23 DIAGNOSIS — S161XXA Strain of muscle, fascia and tendon at neck level, initial encounter: Secondary | ICD-10-CM

## 2021-08-23 DIAGNOSIS — I10 Essential (primary) hypertension: Secondary | ICD-10-CM

## 2021-08-23 MED ORDER — LISINOPRIL-HYDROCHLOROTHIAZIDE 20-25 MG PO TABS
1.0000 | ORAL_TABLET | Freq: Every day | ORAL | 3 refills | Status: DC
  Filled 2021-08-23: qty 30, 30d supply, fill #0
  Filled 2021-11-04: qty 30, 30d supply, fill #1
  Filled 2021-12-08: qty 30, 30d supply, fill #2
  Filled 2022-01-26: qty 30, 30d supply, fill #3

## 2021-08-23 MED ORDER — MELOXICAM 15 MG PO TABS
15.0000 mg | ORAL_TABLET | Freq: Every day | ORAL | 0 refills | Status: DC
  Filled 2021-08-23: qty 30, 30d supply, fill #0

## 2021-08-23 MED ORDER — PRAVASTATIN SODIUM 40 MG PO TABS
40.0000 mg | ORAL_TABLET | Freq: Every day | ORAL | 4 refills | Status: DC
  Filled 2021-08-23: qty 30, 30d supply, fill #0
  Filled 2021-11-04: qty 30, 30d supply, fill #1
  Filled 2021-12-08: qty 30, 30d supply, fill #2
  Filled 2022-01-26: qty 30, 30d supply, fill #3

## 2021-08-23 NOTE — Telephone Encounter (Signed)
Medication: patient states he needs all rx's refilled expect weight loss injection      Has the patient contacted their pharmacy? No.     Preferred Pharmacy (with phone number or street name): [facility]  [address], [place], [place] [address]  Phone:  [phone]  Fax:  [phone]

## 2021-08-23 NOTE — Telephone Encounter (Signed)
Refills done.

## 2021-08-26 ENCOUNTER — Ambulatory Visit: Payer: 59 | Admitting: Family Medicine

## 2021-09-14 ENCOUNTER — Encounter: Payer: Self-pay | Admitting: Family Medicine

## 2021-09-14 ENCOUNTER — Ambulatory Visit: Payer: Self-pay | Admitting: Family Medicine

## 2021-09-19 ENCOUNTER — Ambulatory Visit: Payer: 59 | Attending: Family Medicine | Admitting: Physical Therapy

## 2021-09-19 DIAGNOSIS — R262 Difficulty in walking, not elsewhere classified: Secondary | ICD-10-CM | POA: Insufficient documentation

## 2021-09-19 DIAGNOSIS — R252 Cramp and spasm: Secondary | ICD-10-CM | POA: Insufficient documentation

## 2021-09-19 DIAGNOSIS — M6281 Muscle weakness (generalized): Secondary | ICD-10-CM | POA: Insufficient documentation

## 2021-09-19 DIAGNOSIS — G8929 Other chronic pain: Secondary | ICD-10-CM | POA: Insufficient documentation

## 2021-09-19 DIAGNOSIS — M5442 Lumbago with sciatica, left side: Secondary | ICD-10-CM | POA: Insufficient documentation
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
## 2021-09-22 ENCOUNTER — Ambulatory Visit: Payer: 59

## 2021-09-26 ENCOUNTER — Ambulatory Visit: Payer: 59 | Admitting: Physical Therapy

## 2021-09-26 DIAGNOSIS — R262 Difficulty in walking, not elsewhere classified: Secondary | ICD-10-CM

## 2021-09-26 DIAGNOSIS — R252 Cramp and spasm: Secondary | ICD-10-CM

## 2021-09-26 DIAGNOSIS — M5442 Lumbago with sciatica, left side: Secondary | ICD-10-CM | POA: Diagnosis present

## 2021-09-26 DIAGNOSIS — M6281 Muscle weakness (generalized): Secondary | ICD-10-CM

## 2021-09-26 DIAGNOSIS — G8929 Other chronic pain: Secondary | ICD-10-CM
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
## 2021-09-27 ENCOUNTER — Other Ambulatory Visit (HOSPITAL_BASED_OUTPATIENT_CLINIC_OR_DEPARTMENT_OTHER): Payer: Self-pay

## 2021-09-27 ENCOUNTER — Encounter: Payer: Self-pay | Admitting: Family Medicine

## 2021-09-27 ENCOUNTER — Telehealth (INDEPENDENT_AMBULATORY_CARE_PROVIDER_SITE_OTHER): Payer: Self-pay | Admitting: Family Medicine

## 2021-09-27 DIAGNOSIS — M48061 Spinal stenosis, lumbar region without neurogenic claudication: Secondary | ICD-10-CM

## 2021-09-27 MED ORDER — CYCLOBENZAPRINE HCL 10 MG PO TABS
5.0000 mg | ORAL_TABLET | Freq: Three times a day (TID) | ORAL | 0 refills | Status: DC | PRN
  Filled 2021-09-27: qty 21, 7d supply, fill #0
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
## 2021-09-29 ENCOUNTER — Ambulatory Visit: Payer: 59

## 2021-09-30 ENCOUNTER — Other Ambulatory Visit (HOSPITAL_BASED_OUTPATIENT_CLINIC_OR_DEPARTMENT_OTHER): Payer: Self-pay

## 2021-10-03 ENCOUNTER — Ambulatory Visit: Payer: 59 | Admitting: Physical Therapy

## 2021-10-10 ENCOUNTER — Other Ambulatory Visit: Payer: Self-pay | Admitting: Family Medicine

## 2021-10-10 ENCOUNTER — Other Ambulatory Visit (HOSPITAL_BASED_OUTPATIENT_CLINIC_OR_DEPARTMENT_OTHER): Payer: Self-pay

## 2021-10-10 MED ORDER — TRULICITY 1.5 MG/0.5ML ~~LOC~~ SOAJ
1.5000 mg | SUBCUTANEOUS | 2 refills | Status: DC
  Filled 2021-10-10: qty 2, 28d supply, fill #0

## 2021-10-11 ENCOUNTER — Other Ambulatory Visit (HOSPITAL_BASED_OUTPATIENT_CLINIC_OR_DEPARTMENT_OTHER): Payer: Self-pay

## 2021-10-12 ENCOUNTER — Ambulatory Visit: Payer: 59 | Admitting: Physical Therapy

## 2021-10-13 ENCOUNTER — Ambulatory Visit (INDEPENDENT_AMBULATORY_CARE_PROVIDER_SITE_OTHER): Payer: Commercial Managed Care - HMO | Admitting: Orthopaedic Surgery

## 2021-10-13 ENCOUNTER — Other Ambulatory Visit (HOSPITAL_BASED_OUTPATIENT_CLINIC_OR_DEPARTMENT_OTHER): Payer: Self-pay

## 2021-10-13 DIAGNOSIS — M48062 Spinal stenosis, lumbar region with neurogenic claudication: Secondary | ICD-10-CM
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
## 2021-10-20 ENCOUNTER — Encounter: Payer: 59 | Admitting: Physical Therapy

## 2021-10-25 ENCOUNTER — Ambulatory Visit (HOSPITAL_BASED_OUTPATIENT_CLINIC_OR_DEPARTMENT_OTHER): Payer: Managed Care, Other (non HMO) | Admitting: Physical Therapy

## 2021-10-25 ENCOUNTER — Ambulatory Visit (HOSPITAL_BASED_OUTPATIENT_CLINIC_OR_DEPARTMENT_OTHER): Payer: 59 | Attending: Family Medicine | Admitting: Physical Therapy

## 2021-11-01 ENCOUNTER — Ambulatory Visit (HOSPITAL_BASED_OUTPATIENT_CLINIC_OR_DEPARTMENT_OTHER): Payer: 59 | Admitting: Physical Therapy

## 2021-11-04 ENCOUNTER — Telehealth: Payer: Self-pay

## 2021-11-04 ENCOUNTER — Other Ambulatory Visit (HOSPITAL_BASED_OUTPATIENT_CLINIC_OR_DEPARTMENT_OTHER): Payer: Self-pay

## 2021-11-04 ENCOUNTER — Other Ambulatory Visit: Payer: Self-pay | Admitting: Family Medicine

## 2021-11-04 DIAGNOSIS — F418 Other specified anxiety disorders: Secondary | ICD-10-CM

## 2021-11-04 MED ORDER — NORTRIPTYLINE HCL 25 MG PO CAPS
25.0000 mg | ORAL_CAPSULE | Freq: Every day | ORAL | 2 refills | Status: DC
  Filled 2021-11-04: qty 30, 30d supply, fill #0
  Filled 2021-12-08: qty 30, 30d supply, fill #1
  Filled 2022-01-26: qty 30, 30d supply, fill #2

## 2021-11-04 MED ORDER — OZEMPIC (0.25 OR 0.5 MG/DOSE) 2 MG/3ML ~~LOC~~ SOPN
0.2500 mg | PEN_INJECTOR | SUBCUTANEOUS | 0 refills | Status: DC
  Filled 2021-11-04: qty 9, 84d supply, fill #0

## 2021-11-04 NOTE — Telephone Encounter (Signed)
Correct. Ozempic removed. I think we were trying to see what was covered for a while, disregard PA for Ozempic. Ty.

## 2021-11-04 NOTE — Telephone Encounter (Signed)
Received PA for Ozempic- I see that he is on Trulicity- he should not be on both correct?

## 2021-11-07 ENCOUNTER — Encounter: Payer: Self-pay | Admitting: Family Medicine

## 2021-11-07 ENCOUNTER — Ambulatory Visit (INDEPENDENT_AMBULATORY_CARE_PROVIDER_SITE_OTHER): Payer: Managed Care, Other (non HMO) | Admitting: Family Medicine

## 2021-11-07 VITALS — BP 118/72 | HR 101 | Temp 98.2°F | Ht 75.0 in | Wt 323.4 lb

## 2021-11-07 DIAGNOSIS — E669 Obesity, unspecified: Secondary | ICD-10-CM

## 2021-11-07 DIAGNOSIS — I10 Essential (primary) hypertension: Secondary | ICD-10-CM | POA: Diagnosis not present

## 2021-11-07 DIAGNOSIS — E1169 Type 2 diabetes mellitus with other specified complication: Secondary | ICD-10-CM

## 2021-11-07 DIAGNOSIS — Z01818 Encounter for other preprocedural examination: Secondary | ICD-10-CM | POA: Diagnosis not present

## 2021-11-07 LAB — CBC
HCT: 50.7 % (ref 39.0–52.0)
Hemoglobin: 17.5 g/dL — ABNORMAL HIGH (ref 13.0–17.0)
MCHC: 34.5 g/dL (ref 30.0–36.0)
MCV: 104.1 fl — ABNORMAL HIGH (ref 78.0–100.0)
Platelets: 178 10*3/uL (ref 150.0–400.0)
RBC: 4.87 Mil/uL (ref 4.22–5.81)
RDW: 13.1 % (ref 11.5–15.5)
WBC: 4.8 10*3/uL (ref 4.0–10.5)

## 2021-11-07 LAB — COMPREHENSIVE METABOLIC PANEL
ALT: 11 U/L (ref 0–53)
AST: 16 U/L (ref 0–37)
Albumin: 4.2 g/dL (ref 3.5–5.2)
Alkaline Phosphatase: 24 U/L — ABNORMAL LOW (ref 39–117)
BUN: 12 mg/dL (ref 6–23)
CO2: 23 mEq/L (ref 19–32)
Calcium: 9.7 mg/dL (ref 8.4–10.5)
Chloride: 93 mEq/L — ABNORMAL LOW (ref 96–112)
Creatinine, Ser: 0.93 mg/dL (ref 0.40–1.50)
GFR: 95.46 mL/min (ref >60.00)
Glucose, Bld: 92 mg/dL (ref 70–99)
Potassium: 3.9 mEq/L (ref 3.5–5.1)
Sodium: 133 mEq/L — ABNORMAL LOW (ref 135–145)
Total Bilirubin: 1.4 mg/dL — ABNORMAL HIGH (ref 0.2–1.2)
Total Protein: 7.3 g/dL (ref 6.0–8.3)

## 2021-11-07 LAB — LIPID PANEL
Cholesterol: 138 mg/dL (ref 0–200)
HDL: 37.9 mg/dL — ABNORMAL LOW (ref >39.00)
LDL Cholesterol: 74 mg/dL (ref 0–99)
NonHDL: 100.45
Total CHOL/HDL Ratio: 4
Triglycerides: 134 mg/dL (ref 0.0–149.0)
VLDL: 26.8 mg/dL (ref 0.0–40.0)

## 2021-11-07 LAB — HEMOGLOBIN A1C: Hgb A1c MFr Bld: 5.8 % (ref 4.6–6.5)

## 2021-11-07 NOTE — Telephone Encounter (Signed)
Noted, PA cancelled.

## 2021-11-07 NOTE — Patient Instructions (Addendum)
Dr. Eddie Dibbles office: [phone] His office should have information about the neurosurgeon they referred you to. Please do not miss that specialty appointment.  Check your blood pressure a few times a month. Let me know if it gets consistently lower than 110 on the top.   Give [place] 2-3 business days to get the results of your labs back.   I recommend getting the flu shot in mid October. This suggestion would change if the CDC comes out with a different recommendation.   Let us know if you need anything.
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
## 2021-11-08 ENCOUNTER — Other Ambulatory Visit (HOSPITAL_BASED_OUTPATIENT_CLINIC_OR_DEPARTMENT_OTHER): Payer: Self-pay

## 2021-11-08 ENCOUNTER — Ambulatory Visit (HOSPITAL_BASED_OUTPATIENT_CLINIC_OR_DEPARTMENT_OTHER): Payer: 59 | Admitting: Physical Therapy

## 2021-11-08 ENCOUNTER — Telehealth: Payer: Self-pay | Admitting: Family Medicine

## 2021-11-08 MED ORDER — AMOXICILLIN 500 MG PO CAPS
500.0000 mg | ORAL_CAPSULE | Freq: Two times a day (BID) | ORAL | 0 refills | Status: AC
  Filled 2021-11-08: qty 10, 5d supply, fill #0

## 2021-11-08 NOTE — Telephone Encounter (Signed)
Patient informed. 

## 2021-11-08 NOTE — Telephone Encounter (Signed)
Patient called dentist and they cannot see him until next week. Patient is having some pain related to the broken tooth and the dental office suggested he call our office and request amoxicillin and ibuprofen to help prior to his appointment. Patient is planning to come to pick up some other medications this afternoon after lunch and wanted to know if he could get these prescriptions at the same time. Please call to advise.

## 2021-11-08 NOTE — Telephone Encounter (Signed)
Amox sent. Ibuprofen is OTC. Ty.

## 2021-11-10 ENCOUNTER — Ambulatory Visit (HOSPITAL_BASED_OUTPATIENT_CLINIC_OR_DEPARTMENT_OTHER): Payer: 59 | Admitting: Physical Therapy

## 2021-11-15 ENCOUNTER — Ambulatory Visit: Payer: 59 | Admitting: Physical Therapy

## 2021-11-22 ENCOUNTER — Other Ambulatory Visit: Payer: Self-pay | Admitting: Neurosurgery

## 2021-11-25 ENCOUNTER — Other Ambulatory Visit (HOSPITAL_BASED_OUTPATIENT_CLINIC_OR_DEPARTMENT_OTHER): Payer: Self-pay

## 2021-11-25 ENCOUNTER — Telehealth: Payer: Self-pay | Admitting: Family Medicine

## 2021-11-25 MED ORDER — TRULICITY 1.5 MG/0.5ML ~~LOC~~ SOAJ
1.5000 mg | SUBCUTANEOUS | 2 refills | Status: DC
  Filled 2021-11-25: qty 2, 28d supply, fill #0
  Filled 2022-01-26: qty 2, 28d supply, fill #1

## 2021-11-25 NOTE — Telephone Encounter (Signed)
Medication:   Rx #: [id number]  Dulaglutide (TRULICITY) 1.5 PP/9.5LO SOPN [[id number]]   Has the patient contacted their pharmacy? No. (If no, request that the patient contact the pharmacy for the refill.) (If yes, when and what did the pharmacy advise?)  Preferred Pharmacy (with phone number or street name):   [facility]  [address], [place], [place] [address]  Phone:  [phone]  Fax:  [phone]   Agent: Please be advised that RX refills may take up to 3 business days. We ask that you follow-up with your pharmacy.

## 2021-11-25 NOTE — Telephone Encounter (Signed)
Refill done Patient notified.

## 2021-12-01 ENCOUNTER — Encounter
Payer: Commercial Managed Care - HMO | Attending: Physical Medicine and Rehabilitation | Admitting: Physical Medicine and Rehabilitation

## 2021-12-08 ENCOUNTER — Other Ambulatory Visit (HOSPITAL_BASED_OUTPATIENT_CLINIC_OR_DEPARTMENT_OTHER): Payer: Self-pay

## 2021-12-22 ENCOUNTER — Other Ambulatory Visit: Payer: Self-pay | Admitting: Neurosurgery

## 2022-01-17 ENCOUNTER — Telehealth: Payer: Self-pay | Admitting: Family Medicine

## 2022-01-17 ENCOUNTER — Other Ambulatory Visit (HOSPITAL_BASED_OUTPATIENT_CLINIC_OR_DEPARTMENT_OTHER): Payer: Self-pay

## 2022-01-17 NOTE — Telephone Encounter (Signed)
Called informed of PCP instructions  He will call his neurosurgeon Appt with PCP on 01/25/22.

## 2022-01-17 NOTE — Telephone Encounter (Signed)
Patient states he needs a letter stating he is still not able to go back to work due to not being able to walk. He would like a call when the letter is ready so he can come pick it up. Please advise.

## 2022-01-17 NOTE — Telephone Encounter (Signed)
Probably needs appt as it has been a while. If it is because of his back issues, may be better coming from surgeon. Ty.

## 2022-01-18 ENCOUNTER — Encounter: Payer: Self-pay | Admitting: Family Medicine

## 2022-01-18 NOTE — Telephone Encounter (Signed)
Error

## 2022-01-18 NOTE — Telephone Encounter (Signed)
Pt called to follow up on earlier information and to see if Dr. Nani Ravens could write the note for work. Asked if pt had called neurosurgeon to see if they could write it. Pt stated he hadn't and did not understand why Dr. Nani Ravens could not do it. Advised that it may be due to neurosurgeon providing treatment that it would be better that it came from him if any questions needed to be answered. Pt asked if Shirlean Mylar could call him back at a later time. Advised a note would be sent back to call him back later. Pt acknowledged understanding.

## 2022-01-18 NOTE — Telephone Encounter (Signed)
Patient called to follow up on letter he requested this morning. Advised provider has been sent the message and to allow a little more time.

## 2022-01-24 ENCOUNTER — Encounter: Payer: Self-pay | Admitting: Family Medicine

## 2022-01-24 ENCOUNTER — Ambulatory Visit: Payer: Commercial Managed Care - HMO | Admitting: Family Medicine

## 2022-01-25 ENCOUNTER — Ambulatory Visit: Payer: Commercial Managed Care - HMO | Admitting: Family Medicine

## 2022-01-26 ENCOUNTER — Other Ambulatory Visit (HOSPITAL_BASED_OUTPATIENT_CLINIC_OR_DEPARTMENT_OTHER): Payer: Self-pay

## 2022-01-27 NOTE — Pre-Procedure Instructions (Signed)
Surgical Instructions    Your procedure is scheduled on Wednesday, November 29th.  Report to [facility] Main Entrance "A" at 10:30 A.M., then check in with the Admitting office.  Call this number if you have problems the morning of surgery:  [phone]   If you have any questions prior to your surgery date call [phone]: Open Monday-Friday 8am-4pm    Remember:  Do not eat or drink after midnight the night before your surgery    Take these medicines the morning of surgery with A SIP OF WATER   If needed: cyclobenzaprine (FLEXERIL)  hydroxypropyl methylcellulose / hypromellose (ISOPTO TEARS / GONIOVISC) eye drops traMADol (ULTRAM)   As of today, STOP taking any Aspirin (unless otherwise instructed by your surgeon) Aleve, Naproxen, Ibuprofen, Motrin, Advil, Goody's, BC's, all herbal medications, fish oil, and all vitamins.  WHAT DO I DO ABOUT MY DIABETES MEDICATION?   Hold Dulaglutide (TRULICITY) for 7 DAYS prior to surgery.     HOW TO MANAGE YOUR DIABETES BEFORE AND AFTER SURGERY  Why is it important to control my blood sugar before and after surgery? Improving blood sugar levels before and after surgery helps healing and can limit problems. A way of improving blood sugar control is eating a healthy diet by:  Eating less sugar and carbohydrates  Increasing activity/exercise  Talking with your doctor about reaching your blood sugar goals High blood sugars (greater than 180 mg/dL) can raise your risk of infections and slow your recovery, so you will need to focus on controlling your diabetes during the weeks before surgery. Make sure that the doctor who takes care of your diabetes knows about your planned surgery including the date and location.  How do I manage my blood sugar before surgery? Check your blood sugar at least 4 times a day, starting 2 days before surgery, to make sure that the level is not too high or low.  Check your blood sugar the morning of your  surgery when you wake up and every 2 hours until you get to the Short Stay unit.  If your blood sugar is less than 70 mg/dL, you will need to treat for low blood sugar: Do not take insulin. Treat a low blood sugar (less than 70 mg/dL) with  cup of clear juice (cranberry or apple), 4 glucose tablets, OR glucose gel. Recheck blood sugar in 15 minutes after treatment (to make sure it is greater than 70 mg/dL). If your blood sugar is not greater than 70 mg/dL on recheck, call [phone] for further instructions. Report your blood sugar to the short stay nurse when you get to Short Stay.  If you are admitted to the hospital after surgery: Your blood sugar will be checked by the staff and you will probably be given insulin after surgery (instead of oral diabetes medicines) to make sure you have good blood sugar levels. The goal for blood sugar control after surgery is 80-180 mg/dL.                     Do NOT Smoke (Tobacco/Vaping) for 24 hours prior to your procedure.  If you use a CPAP at night, you may bring your mask/headgear for your overnight stay.   Contacts, glasses, piercing's, hearing aid's, dentures or partials may not be worn into surgery, please bring cases for these belongings.    For patients admitted to the hospital, discharge time will be determined by your treatment team.   Patients discharged the day of surgery will  not be allowed to drive home, and someone needs to stay with them for 24 hours.  SURGICAL WAITING ROOM VISITATION Patients having surgery or a procedure may have no more than 2 support people in the waiting area - these visitors may rotate.   Children under the age of 31 must have an adult with them who is not the patient. If the patient needs to stay at the hospital during part of their recovery, the visitor guidelines for inpatient rooms apply. Pre-op nurse will coordinate an appropriate time for 1 support person to accompany patient in pre-op.  This support  person may not rotate.   Please refer to the [facility] website for the visitor guidelines for Inpatients (after your surgery is over and you are in a regular room).    Special instructions:   [facility]- Preparing For Surgery  Before surgery, you can play an important role. Because skin is not sterile, your skin needs to be as free of germs as possible. You can reduce the number of germs on your skin by washing with CHG (chlorahexidine gluconate) Soap before surgery.  CHG is an antiseptic cleaner which kills germs and bonds with the skin to continue killing germs even after washing.    Oral Hygiene is also important to reduce your risk of infection.  Remember - BRUSH YOUR TEETH THE MORNING OF SURGERY WITH YOUR REGULAR TOOTHPASTE  Please do not use if you have an allergy to CHG or antibacterial soaps. If your skin becomes reddened/irritated stop using the CHG.  Do not shave (including legs and underarms) for at least 48 hours prior to first CHG shower. It is OK to shave your face.  Please follow these instructions carefully.   Shower the NIGHT BEFORE SURGERY and the MORNING OF SURGERY  If you chose to wash your hair, wash your hair first as usual with your normal shampoo.  After you shampoo, rinse your hair and body thoroughly to remove the shampoo.  Use CHG Soap as you would any other liquid soap. You can apply CHG directly to the skin and wash gently with a scrungie or a clean washcloth.   Apply the CHG Soap to your body ONLY FROM THE NECK DOWN.  Do not use on open wounds or open sores. Avoid contact with your eyes, ears, mouth and genitals (private parts). Wash Face and genitals (private parts)  with your normal soap.   Wash thoroughly, paying special attention to the area where your surgery will be performed.  Thoroughly rinse your body with warm water from the neck down.  DO NOT shower/wash with your normal soap after using and rinsing off the CHG Soap.  Pat yourself dry with a  CLEAN TOWEL.  Wear CLEAN PAJAMAS to bed the night before surgery  Place CLEAN SHEETS on your bed the night before your surgery  DO NOT SLEEP WITH PETS.   Day of Surgery: Take a shower with CHG soap. Do not wear jewelry  Do not wear lotions, powders, colognes, or deodorant.  Men may shave face and neck. Do not bring valuables to the hospital. [facility] is not responsible for any belongings or valuables.  Wear Clean/Comfortable clothing the morning of surgery Remember to brush your teeth WITH YOUR REGULAR TOOTHPASTE.   Please read over the following fact sheets that you were given.    If you received a COVID test during your pre-op visit  it is requested that you wear a mask when out in public, stay away  from anyone that may not be feeling well and notify your surgeon if you develop symptoms. If you have been in contact with anyone that has tested positive in the last 10 days please notify you surgeon.

## 2022-01-30 ENCOUNTER — Encounter (HOSPITAL_COMMUNITY)
Admission: RE | Admit: 2022-01-30 | Discharge: 2022-01-30 | Disposition: A | Discharge status: Home / Self Care | Payer: Commercial Managed Care - HMO | Source: Ambulatory Visit | Attending: Neurosurgery | Admitting: Neurosurgery

## 2022-01-30 ENCOUNTER — Other Ambulatory Visit: Payer: Self-pay

## 2022-01-30 ENCOUNTER — Encounter (HOSPITAL_COMMUNITY): Payer: Self-pay

## 2022-01-30 VITALS — BP 150/99 | HR 78 | Temp 98.2°F | Resp 19 | Ht 74.0 in | Wt 349.0 lb

## 2022-01-30 DIAGNOSIS — E1169 Type 2 diabetes mellitus with other specified complication: Secondary | ICD-10-CM | POA: Insufficient documentation

## 2022-01-30 DIAGNOSIS — I10 Essential (primary) hypertension: Secondary | ICD-10-CM | POA: Diagnosis not present

## 2022-01-30 DIAGNOSIS — Z01818 Encounter for other preprocedural examination: Secondary | ICD-10-CM | POA: Diagnosis present

## 2022-01-30 DIAGNOSIS — F1010 Alcohol abuse, uncomplicated: Secondary | ICD-10-CM | POA: Diagnosis not present

## 2022-01-30 DIAGNOSIS — E669 Obesity, unspecified: Secondary | ICD-10-CM | POA: Diagnosis not present

## 2022-01-30 LAB — CBC
HCT: 48.9 % (ref 39.0–52.0)
Hemoglobin: 16.5 g/dL (ref 13.0–17.0)
MCH: 35.2 pg — ABNORMAL HIGH (ref 26.0–34.0)
MCHC: 33.7 g/dL (ref 30.0–36.0)
MCV: 104.3 fL — ABNORMAL HIGH (ref 80.0–100.0)
Platelets: 162 K/uL (ref 150–400)
RBC: 4.69 MIL/uL (ref 4.22–5.81)
RDW: 14.4 % (ref 11.5–15.5)
WBC: 3.3 K/uL — ABNORMAL LOW (ref 4.0–10.5)
nRBC: 0 % (ref 0.0–0.2)

## 2022-01-30 LAB — COMPREHENSIVE METABOLIC PANEL
ALT: 26 U/L (ref 0–44)
AST: 36 U/L (ref 15–41)
Albumin: 3.9 g/dL (ref 3.5–5.0)
Alkaline Phosphatase: 42 U/L (ref 38–126)
Anion gap: 13 (ref 5–15)
BUN: 7 mg/dL (ref 6–20)
CO2: 22 mmol/L (ref 22–32)
Calcium: 8.8 mg/dL — ABNORMAL LOW (ref 8.9–10.3)
Chloride: 103 mmol/L (ref 98–111)
Creatinine, Ser: 0.63 mg/dL (ref 0.61–1.24)
GFR, Estimated: >60 mL/min (ref >60)
Glucose, Bld: 99 mg/dL (ref 70–99)
Potassium: 3.8 mmol/L (ref 3.5–5.1)
Sodium: 138 mmol/L (ref 135–145)
Total Bilirubin: 0.7 mg/dL (ref 0.3–1.2)
Total Protein: 7.5 g/dL (ref 6.5–8.1)

## 2022-01-30 LAB — HEMOGLOBIN A1C
Hgb A1c MFr Bld: 5.3 % (ref 4.8–5.6)
Mean Plasma Glucose: 105.41 mg/dL

## 2022-01-30 LAB — GLUCOSE, CAPILLARY: Glucose-Capillary: 117 mg/dL — ABNORMAL HIGH (ref 70–99)

## 2022-01-30 LAB — SURGICAL PCR SCREEN
MRSA, PCR: NEGATIVE
Staphylococcus aureus: NEGATIVE

## 2022-01-30 NOTE — Progress Notes (Signed)
   01/30/22 1126  OBSTRUCTIVE SLEEP APNEA  Have you ever been diagnosed with sleep apnea through a sleep study? No  Do you snore loudly (loud enough to be heard through closed doors)?  1  Do you often feel tired, fatigued, or sleepy during the daytime (such as falling asleep during driving or talking to someone)? 1  Has anyone observed you stop breathing during your sleep? 0  Do you have, or are you being treated for high blood pressure? 1  BMI more than 35 kg/m2? 1  Age > 50 (1-yes) 1  Neck circumference greater than:Male 16 inches or larger, Male 17inches or larger? 1  Male Gender (Yes=1) 1  Obstructive Sleep Apnea Score 7  Score 5 or greater  Results sent to PCP

## 2022-01-30 NOTE — Progress Notes (Signed)
PCP - Dr. Riki Sheer Cardiologist - denies  PPM/ICD - denies   Chest x-ray - 01/31/17 EKG - 01/30/22 Stress Test - denies ECHO - denies Cardiac Cath - denies  Sleep Study - denies   DM- Type 2 Pt does not check CBG at home  Last dose of GLP1 agonist-  01/27/22 GLP1 instructions: Pt instructed not to take Trulicity again before surgery. Next dose 12/1  ASA/Blood Thinner Instructions: n/a   ERAS Protcol - no, NPO   COVID TEST- n/a   Anesthesia review: no  Patient denies shortness of breath, fever, cough and chest pain at PAT appointment   All instructions explained to the patient, with a verbal understanding of the material. Patient agrees to go over the instructions while at home for a better understanding. The opportunity to ask questions was provided.

## 2022-02-08 ENCOUNTER — Encounter (HOSPITAL_COMMUNITY): Payer: Self-pay

## 2022-02-08 ENCOUNTER — Other Ambulatory Visit (HOSPITAL_BASED_OUTPATIENT_CLINIC_OR_DEPARTMENT_OTHER): Payer: Self-pay

## 2022-02-08 ENCOUNTER — Ambulatory Visit (HOSPITAL_BASED_OUTPATIENT_CLINIC_OR_DEPARTMENT_OTHER): Payer: Commercial Managed Care - HMO | Admitting: Anesthesiology

## 2022-02-08 ENCOUNTER — Ambulatory Visit (HOSPITAL_COMMUNITY): Payer: Commercial Managed Care - HMO

## 2022-02-08 ENCOUNTER — Ambulatory Visit (HOSPITAL_COMMUNITY): Payer: Commercial Managed Care - HMO | Admitting: Anesthesiology

## 2022-02-08 ENCOUNTER — Other Ambulatory Visit: Payer: Self-pay

## 2022-02-08 ENCOUNTER — Ambulatory Visit (HOSPITAL_COMMUNITY)
Admission: RE | Disposition: A | Discharge status: Home / Self Care | Payer: Self-pay | Source: Home / Self Care | Attending: Neurosurgery

## 2022-02-08 ENCOUNTER — Ambulatory Visit (HOSPITAL_COMMUNITY)
Admission: RE | Admit: 2022-02-08 | Discharge: 2022-02-08 | Disposition: A | Discharge status: Home / Self Care | Payer: Commercial Managed Care - HMO | Attending: Neurosurgery | Admitting: Neurosurgery

## 2022-02-08 DIAGNOSIS — I10 Essential (primary) hypertension: Secondary | ICD-10-CM | POA: Insufficient documentation

## 2022-02-08 DIAGNOSIS — E559 Vitamin D deficiency, unspecified: Secondary | ICD-10-CM | POA: Insufficient documentation

## 2022-02-08 DIAGNOSIS — F17290 Nicotine dependence, other tobacco product, uncomplicated: Secondary | ICD-10-CM | POA: Diagnosis not present

## 2022-02-08 DIAGNOSIS — F419 Anxiety disorder, unspecified: Secondary | ICD-10-CM | POA: Diagnosis not present

## 2022-02-08 DIAGNOSIS — F17210 Nicotine dependence, cigarettes, uncomplicated: Secondary | ICD-10-CM | POA: Diagnosis not present

## 2022-02-08 DIAGNOSIS — Z6841 Body Mass Index (BMI) 40.0 and over, adult: Secondary | ICD-10-CM | POA: Diagnosis not present

## 2022-02-08 DIAGNOSIS — M47819 Spondylosis without myelopathy or radiculopathy, site unspecified: Secondary | ICD-10-CM | POA: Diagnosis not present

## 2022-02-08 DIAGNOSIS — F4310 Post-traumatic stress disorder, unspecified: Secondary | ICD-10-CM | POA: Insufficient documentation

## 2022-02-08 DIAGNOSIS — M48062 Spinal stenosis, lumbar region with neurogenic claudication: Secondary | ICD-10-CM | POA: Diagnosis present

## 2022-02-08 DIAGNOSIS — E119 Type 2 diabetes mellitus without complications: Secondary | ICD-10-CM | POA: Insufficient documentation

## 2022-02-08 DIAGNOSIS — M48061 Spinal stenosis, lumbar region without neurogenic claudication: Secondary | ICD-10-CM

## 2022-02-08 DIAGNOSIS — E785 Hyperlipidemia, unspecified: Secondary | ICD-10-CM | POA: Diagnosis not present

## 2022-02-08 DIAGNOSIS — M545 Low back pain, unspecified: Secondary | ICD-10-CM | POA: Diagnosis not present

## 2022-02-08 DIAGNOSIS — E1169 Type 2 diabetes mellitus with other specified complication: Secondary | ICD-10-CM

## 2022-02-08 HISTORY — PX: LUMBAR LAMINECTOMY/DECOMPRESSION MICRODISCECTOMY: SHX5026

## 2022-02-08 LAB — GLUCOSE, CAPILLARY
Glucose-Capillary: 148 mg/dL — ABNORMAL HIGH (ref 70–99)
Glucose-Capillary: 130 mg/dL — ABNORMAL HIGH (ref 70–99)

## 2022-02-08 SURGERY — LUMBAR LAMINECTOMY/DECOMPRESSION MICRODISCECTOMY 1 LEVEL
Anesthesia: General

## 2022-02-08 MED ORDER — THROMBIN 5000 UNITS EX SOLR
CUTANEOUS | Status: AC
  Filled 2022-02-08: qty 5000

## 2022-02-08 MED ORDER — DEXMEDETOMIDINE HCL IN NACL 80 MCG/20ML IV SOLN
INTRAVENOUS | Status: AC
  Filled 2022-02-08: qty 20

## 2022-02-08 MED ORDER — MIDAZOLAM HCL 2 MG/2ML IJ SOLN: 0.5000 mg | Freq: Once | INTRAMUSCULAR | Status: DC | PRN

## 2022-02-08 MED ORDER — INSULIN ASPART 100 UNIT/ML IJ SOLN: 0.0000 [IU] | INTRAMUSCULAR | Status: DC | PRN

## 2022-02-08 MED ORDER — CHLORHEXIDINE GLUCONATE CLOTH 2 % EX PADS: 6.0000 | MEDICATED_PAD | Freq: Once | CUTANEOUS | Status: DC

## 2022-02-08 MED ORDER — SUGAMMADEX SODIUM 200 MG/2ML IV SOLN
INTRAVENOUS | Status: DC | PRN
  Administered 2022-02-08: 350 mg via INTRAVENOUS

## 2022-02-08 MED ORDER — ORAL CARE MOUTH RINSE: 15.0000 mL | Freq: Once | OROMUCOSAL | Status: AC

## 2022-02-08 MED ORDER — ONDANSETRON HCL 4 MG/2ML IJ SOLN
INTRAMUSCULAR | Status: AC
  Filled 2022-02-08: qty 2

## 2022-02-08 MED ORDER — OXYCODONE HCL 5 MG PO TABS
ORAL_TABLET | ORAL | Status: AC
  Filled 2022-02-08: qty 1

## 2022-02-08 MED ORDER — LIDOCAINE 2% (20 MG/ML) 5 ML SYRINGE
INTRAMUSCULAR | Status: AC
  Filled 2022-02-08: qty 5

## 2022-02-08 MED ORDER — DEXMEDETOMIDINE HCL IN NACL 80 MCG/20ML IV SOLN
INTRAVENOUS | Status: DC | PRN
  Administered 2022-02-08: 8 ug via BUCCAL
  Administered 2022-02-08: 16 ug via BUCCAL

## 2022-02-08 MED ORDER — FENTANYL CITRATE (PF) 100 MCG/2ML IJ SOLN
INTRAMUSCULAR | Status: DC | PRN
  Administered 2022-02-08: 100 ug via INTRAVENOUS
  Administered 2022-02-08: 150 ug via INTRAVENOUS

## 2022-02-08 MED ORDER — SUGAMMADEX SODIUM 500 MG/5ML IV SOLN
INTRAVENOUS | Status: AC
  Filled 2022-02-08: qty 5

## 2022-02-08 MED ORDER — DEXAMETHASONE SODIUM PHOSPHATE 10 MG/ML IJ SOLN
INTRAMUSCULAR | Status: DC | PRN
  Administered 2022-02-08: 10 mg via INTRAVENOUS

## 2022-02-08 MED ORDER — LIDOCAINE-EPINEPHRINE 1 %-1:100000 IJ SOLN
INTRAMUSCULAR | Status: AC
  Filled 2022-02-08: qty 1

## 2022-02-08 MED ORDER — ACETAMINOPHEN 500 MG PO TABS
1000.0000 mg | ORAL_TABLET | Freq: Once | ORAL | Status: AC
  Administered 2022-02-08: 1000 mg via ORAL
  Filled 2022-02-08: qty 2

## 2022-02-08 MED ORDER — 0.9 % SODIUM CHLORIDE (POUR BTL) OPTIME
TOPICAL | Status: DC | PRN
  Administered 2022-02-08: 1000 mL

## 2022-02-08 MED ORDER — MEPERIDINE HCL 25 MG/ML IJ SOLN: 6.2500 mg | INTRAMUSCULAR | Status: DC | PRN

## 2022-02-08 MED ORDER — BUPIVACAINE HCL (PF) 0.5 % IJ SOLN
INTRAMUSCULAR | Status: DC | PRN
  Administered 2022-02-08: 3 mL

## 2022-02-08 MED ORDER — METHYLPREDNISOLONE ACETATE 80 MG/ML IJ SUSP
INTRAMUSCULAR | Status: DC | PRN
  Administered 2022-02-08: 80 mg

## 2022-02-08 MED ORDER — DEXAMETHASONE SODIUM PHOSPHATE 10 MG/ML IJ SOLN
INTRAMUSCULAR | Status: AC
  Filled 2022-02-08: qty 1

## 2022-02-08 MED ORDER — HYDROMORPHONE HCL 1 MG/ML IJ SOLN
0.2500 mg | INTRAMUSCULAR | Status: DC | PRN
  Administered 2022-02-08: 0.5 mg via INTRAVENOUS

## 2022-02-08 MED ORDER — CHLORHEXIDINE GLUCONATE 0.12 % MT SOLN
15.0000 mL | Freq: Once | OROMUCOSAL | Status: AC
  Administered 2022-02-08: 15 mL via OROMUCOSAL
  Filled 2022-02-08: qty 15

## 2022-02-08 MED ORDER — LIDOCAINE-EPINEPHRINE 1 %-1:100000 IJ SOLN
INTRAMUSCULAR | Status: DC | PRN
  Administered 2022-02-08: 3 mL

## 2022-02-08 MED ORDER — HYDROMORPHONE HCL 1 MG/ML IJ SOLN
INTRAMUSCULAR | Status: AC
  Filled 2022-02-08: qty 1

## 2022-02-08 MED ORDER — MIDAZOLAM HCL 2 MG/2ML IJ SOLN
INTRAMUSCULAR | Status: AC
  Filled 2022-02-08: qty 2

## 2022-02-08 MED ORDER — OXYCODONE HCL 5 MG PO TABS
5.0000 mg | ORAL_TABLET | Freq: Once | ORAL | Status: AC | PRN
  Administered 2022-02-08: 5 mg via ORAL

## 2022-02-08 MED ORDER — THROMBIN 5000 UNITS EX SOLR: OROMUCOSAL | Status: DC | PRN

## 2022-02-08 MED ORDER — PHENYLEPHRINE 80 MCG/ML (10ML) SYRINGE FOR IV PUSH (FOR BLOOD PRESSURE SUPPORT)
PREFILLED_SYRINGE | INTRAVENOUS | Status: AC
  Filled 2022-02-08: qty 20

## 2022-02-08 MED ORDER — PROMETHAZINE HCL 25 MG/ML IJ SOLN: 6.2500 mg | INTRAMUSCULAR | Status: DC | PRN

## 2022-02-08 MED ORDER — ROCURONIUM BROMIDE 10 MG/ML (PF) SYRINGE
PREFILLED_SYRINGE | INTRAVENOUS | Status: DC | PRN
  Administered 2022-02-08: 70 mg via INTRAVENOUS
  Administered 2022-02-08: 20 mg via INTRAVENOUS

## 2022-02-08 MED ORDER — FENTANYL CITRATE (PF) 250 MCG/5ML IJ SOLN
INTRAMUSCULAR | Status: AC
  Filled 2022-02-08: qty 5

## 2022-02-08 MED ORDER — HYDROMORPHONE HCL 1 MG/ML IJ SOLN
INTRAMUSCULAR | Status: DC | PRN
  Administered 2022-02-08: .5 mg via INTRAVENOUS

## 2022-02-08 MED ORDER — CEFAZOLIN IN SODIUM CHLORIDE 3-0.9 GM/100ML-% IV SOLN
3.0000 g | INTRAVENOUS | Status: AC
  Administered 2022-02-08: 3 g via INTRAVENOUS
  Filled 2022-02-08: qty 100

## 2022-02-08 MED ORDER — METHYLPREDNISOLONE ACETATE 80 MG/ML IJ SUSP
INTRAMUSCULAR | Status: AC
  Filled 2022-02-08: qty 1

## 2022-02-08 MED ORDER — OXYCODONE HCL 5 MG/5ML PO SOLN: 5.0000 mg | Freq: Once | ORAL | Status: AC | PRN

## 2022-02-08 MED ORDER — THROMBIN (RECOMBINANT) 5000 UNITS EX SOLR: CUTANEOUS | Status: DC | PRN

## 2022-02-08 MED ORDER — LACTATED RINGERS IV SOLN: INTRAVENOUS | Status: DC

## 2022-02-08 MED ORDER — EPHEDRINE 5 MG/ML INJ
INTRAVENOUS | Status: AC
  Filled 2022-02-08: qty 5

## 2022-02-08 MED ORDER — PROPOFOL 10 MG/ML IV BOLUS
INTRAVENOUS | Status: AC
  Filled 2022-02-08: qty 20

## 2022-02-08 MED ORDER — CYCLOBENZAPRINE HCL 10 MG PO TABS
10.0000 mg | ORAL_TABLET | Freq: Three times a day (TID) | ORAL | 0 refills | Status: DC | PRN
  Filled 2022-02-08: qty 40, 14d supply, fill #0

## 2022-02-08 MED ORDER — PHENYLEPHRINE 80 MCG/ML (10ML) SYRINGE FOR IV PUSH (FOR BLOOD PRESSURE SUPPORT)
PREFILLED_SYRINGE | INTRAVENOUS | Status: DC | PRN
  Administered 2022-02-08 (×3): 160 ug via INTRAVENOUS
  Administered 2022-02-08: 80 ug via INTRAVENOUS
  Administered 2022-02-08: 160 ug via INTRAVENOUS
  Administered 2022-02-08: 80 ug via INTRAVENOUS

## 2022-02-08 MED ORDER — PROPOFOL 10 MG/ML IV BOLUS
INTRAVENOUS | Status: DC | PRN
  Administered 2022-02-08: 50 mg via INTRAVENOUS
  Administered 2022-02-08 (×2): 200 mg via INTRAVENOUS

## 2022-02-08 MED ORDER — LIDOCAINE 2% (20 MG/ML) 5 ML SYRINGE
INTRAMUSCULAR | Status: DC | PRN
  Administered 2022-02-08: 40 mg via INTRAVENOUS

## 2022-02-08 MED ORDER — BUPIVACAINE HCL (PF) 0.5 % IJ SOLN
INTRAMUSCULAR | Status: AC
  Filled 2022-02-08: qty 30

## 2022-02-08 MED ORDER — ROCURONIUM BROMIDE 10 MG/ML (PF) SYRINGE
PREFILLED_SYRINGE | INTRAVENOUS | Status: AC
  Filled 2022-02-08: qty 10

## 2022-02-08 MED ORDER — MIDAZOLAM HCL 2 MG/2ML IJ SOLN
INTRAMUSCULAR | Status: DC | PRN
  Administered 2022-02-08 (×2): 2 mg via INTRAVENOUS

## 2022-02-08 MED ORDER — OXYCODONE-ACETAMINOPHEN 5-325 MG PO TABS
1.0000 | ORAL_TABLET | Freq: Four times a day (QID) | ORAL | 0 refills | Status: DC | PRN
  Filled 2022-02-08: qty 50, 7d supply, fill #0

## 2022-02-08 MED ORDER — ONDANSETRON HCL 4 MG/2ML IJ SOLN
INTRAMUSCULAR | Status: DC | PRN
  Administered 2022-02-08: 4 mg via INTRAVENOUS

## 2022-02-08 MED ORDER — HYDROMORPHONE HCL 1 MG/ML IJ SOLN
INTRAMUSCULAR | Status: AC
  Filled 2022-02-08: qty 0.5

## 2022-02-08 MED ORDER — SUCCINYLCHOLINE CHLORIDE 200 MG/10ML IV SOSY
PREFILLED_SYRINGE | INTRAVENOUS | Status: DC | PRN
  Administered 2022-02-08: 200 mg via INTRAVENOUS

## 2022-02-08 SURGICAL SUPPLY — 51 items
BAG COUNTER SPONGE SURGICOUNT (BAG) ×1 IMPLANT
BAND RUBBER #18 3X1/16 STRL (MISCELLANEOUS) ×2 IMPLANT
BENZOIN TINCTURE PRP APPL 2/3 (GAUZE/BANDAGES/DRESSINGS) ×1 IMPLANT
BLADE CLIPPER SURG (BLADE) IMPLANT
BUR CARBIDE MATCH 3.0 (BURR) IMPLANT
BUR PRECISION FLUTE 5.0 (BURR) IMPLANT
CANISTER SUCT 3000ML PPV (MISCELLANEOUS) ×1 IMPLANT
DERMABOND ADVANCED .7 DNX12 (GAUZE/BANDAGES/DRESSINGS) IMPLANT
DRAPE LAPAROTOMY 100X72X124 (DRAPES) ×1 IMPLANT
DRAPE MICROSCOPE SLANT 54X150 (MISCELLANEOUS) ×1 IMPLANT
DRAPE SURG 17X23 STRL (DRAPES) ×1 IMPLANT
DRSG MEPILEX BORDER 4X4 (GAUZE/BANDAGES/DRESSINGS) IMPLANT
DRSG OPSITE POSTOP 3X4 (GAUZE/BANDAGES/DRESSINGS) ×1 IMPLANT
DRSG OPSITE POSTOP 4X6 (GAUZE/BANDAGES/DRESSINGS) IMPLANT
DURAPREP 26ML APPLICATOR (WOUND CARE) ×1 IMPLANT
ELECT BLADE INSULATED 4IN (ELECTROSURGICAL) ×1
ELECT REM PT RETURN 9FT ADLT (ELECTROSURGICAL) ×1
ELECTRODE BLADE INSULATED 4IN (ELECTROSURGICAL) ×1 IMPLANT
ELECTRODE REM PT RTRN 9FT ADLT (ELECTROSURGICAL) ×1 IMPLANT
GAUZE 4X4 16PLY ~~LOC~~+RFID DBL (SPONGE) IMPLANT
GLOVE BIOGEL PI IND STRL 7.5 (GLOVE) ×2 IMPLANT
GLOVE ECLIPSE 7.5 STRL STRAW (GLOVE) ×1 IMPLANT
GLOVE EXAM NITRILE XL STR (GLOVE) IMPLANT
GLOVE SURG ENC MOIS LTX SZ8 (GLOVE) ×1 IMPLANT
GLOVE SURG UNDER POLY LF SZ8.5 (GLOVE) ×1 IMPLANT
GOWN STRL REUS W/ TWL LRG LVL3 (GOWN DISPOSABLE) ×2 IMPLANT
GOWN STRL REUS W/ TWL XL LVL3 (GOWN DISPOSABLE) ×1 IMPLANT
GOWN STRL REUS W/TWL 2XL LVL3 (GOWN DISPOSABLE) IMPLANT
GOWN STRL REUS W/TWL LRG LVL3 (GOWN DISPOSABLE) ×2
GOWN STRL REUS W/TWL XL LVL3 (GOWN DISPOSABLE) ×1
HEMOSTAT POWDER KIT SURGIFOAM (HEMOSTASIS) ×1 IMPLANT
KIT BASIN OR (CUSTOM PROCEDURE TRAY) ×1 IMPLANT
KIT TURNOVER KIT B (KITS) ×1 IMPLANT
NDL SPNL 18GX3.5 QUINCKE PK (NEEDLE) IMPLANT
NEEDLE HYPO 22GX1.5 SAFETY (NEEDLE) ×1 IMPLANT
NEEDLE SPNL 18GX3.5 QUINCKE PK (NEEDLE) IMPLANT
NS IRRIG 1000ML POUR BTL (IV SOLUTION) ×1 IMPLANT
PACK LAMINECTOMY NEURO (CUSTOM PROCEDURE TRAY) ×1 IMPLANT
PAD ARMBOARD 7.5X6 YLW CONV (MISCELLANEOUS) ×3 IMPLANT
SPIKE FLUID TRANSFER (MISCELLANEOUS) ×1 IMPLANT
SPONGE SURGIFOAM ABS GEL SZ50 (HEMOSTASIS) IMPLANT
SPONGE T-LAP 4X18 ~~LOC~~+RFID (SPONGE) IMPLANT
STRIP CLOSURE SKIN 1/2X4 (GAUZE/BANDAGES/DRESSINGS) ×1 IMPLANT
SUT MNCRL AB 4-0 PS2 18 (SUTURE) ×1 IMPLANT
SUT VIC AB 0 CT1 18XCR BRD8 (SUTURE) ×1 IMPLANT
SUT VIC AB 0 CT1 8-18 (SUTURE) ×1
SUT VIC AB 2-0 CP2 18 (SUTURE) ×1 IMPLANT
SYR 3ML LL SCALE MARK (SYRINGE) IMPLANT
TOWEL GREEN STERILE (TOWEL DISPOSABLE) ×1 IMPLANT
TOWEL GREEN STERILE FF (TOWEL DISPOSABLE) ×1 IMPLANT
WATER STERILE IRR 1000ML POUR (IV SOLUTION) ×1 IMPLANT

## 2022-02-08 NOTE — Anesthesia Postprocedure Evaluation (Signed)
Anesthesia Post Note  Patient: Mark Rich  Procedure(s) Performed: LUMBAR TWO-THREE POSTERIOR DECOMPRESSION WITH BILATERAL FORAMINOTOMY     Patient location during evaluation: PACU Anesthesia Type: General Level of consciousness: awake and alert, oriented and patient cooperative Pain management: pain level controlled Vital Signs Assessment: post-procedure vital signs reviewed and stable Respiratory status: spontaneous breathing, nonlabored ventilation, respiratory function stable and patient connected to nasal cannula oxygen Cardiovascular status: blood pressure returned to baseline and stable Postop Assessment: no apparent nausea or vomiting and adequate PO intake Anesthetic complications: no   No notable events documented.  Last Vitals:  Vitals:   02/08/22 1645 02/08/22 1700  BP:    Pulse: 67 66  Resp: 19 15  Temp:    SpO2: 99% 96%    Last Pain:  Vitals:   02/08/22 1635  TempSrc:   PainSc: 5                  Dariane Natzke,E. Karyn Brull

## 2022-02-08 NOTE — Anesthesia Procedure Notes (Signed)
Procedure Name: Intubation Date/Time: 02/08/2022 1:40 PM  Performed by: Barrington Ellison, CRNAPre-anesthesia Checklist: Patient identified, Emergency Drugs available, Suction available and Patient being monitored Patient Re-evaluated:Patient Re-evaluated prior to induction Oxygen Delivery Method: Circle System Utilized Preoxygenation: Pre-oxygenation with 100% oxygen Induction Type: IV induction Ventilation: Mask ventilation without difficulty Laryngoscope Size: Mac and 4 Grade View: Grade I Tube type: Oral Tube size: 7.5 mm Number of attempts: 1 Airway Equipment and Method: Stylet and Oral airway Placement Confirmation: ETT inserted through vocal cords under direct vision, positive ETCO2 and breath sounds checked- equal and bilateral Secured at: 23 cm Tube secured with: Tape Dental Injury: Teeth and Oropharynx as per pre-operative assessment
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
## 2022-02-08 NOTE — Discharge Instructions (Addendum)
Can remove transparent dressing and shower 48 hours after surgery Walk as much as possible No heavy lifting >10 lbs No excessive bending/twisting at the waist  

## 2022-02-08 NOTE — Anesthesia Preprocedure Evaluation (Addendum)
Anesthesia Evaluation  Patient identified by MRN, date of birth, ID band Patient awake    Reviewed: Allergy & Precautions, NPO status , Patient's Chart, lab work & pertinent test results  History of Anesthesia Complications Negative for: history of anesthetic complications  Airway Mallampati: II  TM Distance: >3 FB Neck ROM: Full    Dental  (+) Dental Advisory Given, Missing, Chipped   Pulmonary Current Smoker and Patient abstained from smoking.   breath sounds clear to auscultation       Cardiovascular hypertension, Pt. on medications (-) angina  Rhythm:Regular Rate:Normal     Neuro/Psych   Anxiety     Spinal stenosis    GI/Hepatic ,,,(+)     substance abuse  alcohol usePt reports emesis during colonoscopy in past   Endo/Other  diabetes (glu 148, no Ozempic in 2 weeks)    Renal/GU negative Renal ROS     Musculoskeletal   Abdominal  (+) + obese  Peds  Hematology negative hematology ROS (+)   Anesthesia Other Findings   Reproductive/Obstetrics                             Anesthesia Physical Anesthesia Plan  ASA: 3  Anesthesia Plan: General   Post-op Pain Management: Tylenol PO (pre-op)*   Induction: Intravenous and Rapid sequence  PONV Risk Score and Plan: 1 and Ondansetron and Dexamethasone  Airway Management Planned: Oral ETT  Additional Equipment: None  Intra-op Plan:   Post-operative Plan: Extubation in OR  Informed Consent: I have reviewed the patients History and Physical, chart, labs and discussed the procedure including the risks, benefits and alternatives for the proposed anesthesia with the patient or authorized representative who has indicated his/her understanding and acceptance.     Dental advisory given  Plan Discussed with: CRNA and Surgeon  Anesthesia Plan Comments:         Anesthesia Quick Evaluation

## 2022-02-08 NOTE — Progress Notes (Signed)
Wasted .20m/ .'5mg'$  of diluadid with Chip HKenton Kingfisher RN in stericycle at [place]11/29/23.

## 2022-02-08 NOTE — Transfer of Care (Signed)
Immediate Anesthesia Transfer of Care Note  Patient: Mark Rich  Procedure(s) Performed: LUMBAR TWO-THREE POSTERIOR DECOMPRESSION WITH BILATERAL FORAMINOTOMY  Patient Location: PACU  Anesthesia Type:General  Level of Consciousness: awake and oriented  Airway & Oxygen Therapy: Patient Spontanous Breathing  Post-op Assessment: Report given to RN  Post vital signs: Reviewed and stable  Last Vitals:  Vitals Value Taken Time  BP 175/131 02/08/22 1618  Temp    Pulse 76 02/08/22 1620  Resp 15 02/08/22 1620  SpO2 93 % 02/08/22 1620  Vitals shown include unvalidated device data.  Last Pain:  Vitals:   02/08/22 1049  TempSrc:   PainSc: 7       Patients Stated Pain Goal: 2 (39/53/20 2334)  Complications: No notable events documented.

## 2022-02-08 NOTE — Op Note (Signed)
PREOP DIAGNOSIS: Lumbar spinal stenosis, L2-3  POSTOP DIAGNOSIS: Lumbar spinal stenosis, L2-3  PROCEDURE: 1. Redo L2-3 laminectomy, bilateral medial facetectomies foraminotomy 2. Use of intraoperative microscope  SURGEON: Dr. Duffy Rhody, MD  ASSISTANT: None  ANESTHESIA: General Endotracheal  EBL: 75 ml  SPECIMENS: None  DRAINS: None  COMPLICATIONS: none  CONDITION: Stable to PCAU  HISTORY: Mark Rich is a 51 y.o. male who had a history of L2-S1 posterior decompression with Dr. Joya Salm complicated by durotomy years ago.  He did well after surgery but recently had recurrence of neurogenic claudication symptoms which were worsening.  Imaging showed residual/recurrent stenosis at L2-3 from bilateral facet hypertrophy as well as broad calcified disc bulge.  Nonsurgical treatments failed to improve his symptoms.  Treatment options were discussed and the patient elected to proceed with surgical decompression by redo laminectomy and foraminotomies at L2-3.  Informed consent was obtained.    PROCEDURE IN DETAIL: After informed consent was obtained and witnessed, the patient was brought to the operating room. After induction of general anesthesia, the patient was positioned on the operative table in the prone position with all pressure points meticulously padded. The skin of the low back was then prepped and draped in the usual sterile fashion.  Using x-ray, the correct levels were identified and marked out on the skin, and after timeout was conducted, the skin was infiltrated with local anesthetic. Skin incision was then made sharply and Bovie electrocautery was used to dissect the scar and incise the fascia.   Lamina at the L2 level was identified and dissection was carried out in the subperiosteal plane. The exposure was difficult due to the patient's body habitus (BMI 46).  Self-retaining retractor was then placed, and intraoperative x-ray was taken to confirm we were at the correct  levels.  Microscope was introduced into the field which was used for the remainder of the case.  Using a high-speed drill, most of the remaining lamina of L2 was removed and modest medial facetecomies were performed. The ligamentum flavum was then identified and removed and the lateral edge of the thecal sac was identified bilaterally. Using a combination of the high-speed drill and Rogers, good lateral decompression was completed.  Kerrison rongeurs were then used to complete foraminotomies at L2-3 bilaterally. The scar tissue as well as some of the overgrown joint more inferiorly was tightly adherent to the dura. As there appeared to be an almost certain likelihood of dural tear with further dissection of scar off the dura due to the tight adherence inferiorly, and there was no obvious remaining thecal sac compression, I felt that the decompression was sufficient.  Hemostasis was then secured using a combination of morcellized Gelfoam and thrombin and bipolar electrocautery. The wound was irrigated with copious amounts of antibiotic saline irrigation.  Exparel was injected into the paraspinous muscles and soft tissues.  Self-retaining retractor was then removed, and the wound is closed in layers using a combination of interrupted 0 Vicryl and 2-0 Vicryl stitches followed by 4-0 monocryl in subcuticular manner and mastisol and steri-strips.  A sterile dressing was placed.   At the end of the case all sponge, needle, and instrument counts were correct. The patient was then transferred to the stretcher and taken to the postanesthesia care unit in stable hemodynamic condition.

## 2022-02-09 ENCOUNTER — Other Ambulatory Visit (HOSPITAL_BASED_OUTPATIENT_CLINIC_OR_DEPARTMENT_OTHER): Payer: Self-pay

## 2022-02-09 ENCOUNTER — Encounter (HOSPITAL_COMMUNITY): Payer: Self-pay | Admitting: Neurosurgery

## 2022-02-09 MED FILL — Thrombin For Soln 5000 Unit: CUTANEOUS | Qty: 2 | Status: AC

## 2022-02-10 ENCOUNTER — Other Ambulatory Visit (HOSPITAL_BASED_OUTPATIENT_CLINIC_OR_DEPARTMENT_OTHER): Payer: Self-pay

## 2022-02-14 ENCOUNTER — Other Ambulatory Visit (HOSPITAL_BASED_OUTPATIENT_CLINIC_OR_DEPARTMENT_OTHER): Payer: Self-pay

## 2022-02-14 MED ORDER — OXYCODONE-ACETAMINOPHEN 10-325 MG PO TABS
1.0000 | ORAL_TABLET | Freq: Four times a day (QID) | ORAL | 0 refills | Status: DC | PRN
  Filled 2022-02-14: qty 40, 10d supply, fill #0

## 2022-02-17 ENCOUNTER — Telehealth: Payer: Self-pay | Admitting: Family Medicine

## 2022-02-17 NOTE — Telephone Encounter (Signed)
Pt called to get a follow up appt with Dr. Nani Ravens about his back. Noticed pt had been dismissed from practice and ask for help from [place] how to handle this matter. Unk Lightning took over call and went over that pt was dismissed on 11.14.23 due to NS to at least 3 appts. Pt stated that he had not received the letter and that he was adamant about seeing Dr. Nani Ravens. Unk Lightning stated that a message would be routed back to resend the letter to him for reference. She also informed him that he had to find a new PCP after 12.14.23 as pt would be out of the 30 days grace period for urgent visits. Appt was made for 12.11.23 to address these issues.

## 2022-02-17 NOTE — Telephone Encounter (Signed)
Send to provider.

## 2022-02-20 ENCOUNTER — Encounter: Payer: Self-pay | Admitting: Family Medicine

## 2022-02-20 ENCOUNTER — Ambulatory Visit (INDEPENDENT_AMBULATORY_CARE_PROVIDER_SITE_OTHER): Payer: Commercial Managed Care - HMO | Admitting: Family Medicine

## 2022-02-20 ENCOUNTER — Other Ambulatory Visit (HOSPITAL_BASED_OUTPATIENT_CLINIC_OR_DEPARTMENT_OTHER): Payer: Self-pay

## 2022-02-20 VITALS — BP 134/78 | HR 93 | Temp 98.5°F

## 2022-02-20 DIAGNOSIS — R29898 Other symptoms and signs involving the musculoskeletal system: Secondary | ICD-10-CM

## 2022-02-20 DIAGNOSIS — M545 Low back pain, unspecified: Secondary | ICD-10-CM

## 2022-02-20 DIAGNOSIS — Z23 Encounter for immunization: Secondary | ICD-10-CM
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
## 2022-02-20 NOTE — Patient Instructions (Signed)
Ice/cold pack over area for 10-15 min twice daily.  OK to take Tylenol 1000 mg (2 extra strength tabs) or 975 mg (3 regular strength tabs) every 6 hours as needed.  If you do not hear anything about your referral in the next week or so, call our office and ask for an update.  Let us know if you need anything.

## 2022-02-22 ENCOUNTER — Other Ambulatory Visit (HOSPITAL_BASED_OUTPATIENT_CLINIC_OR_DEPARTMENT_OTHER): Payer: Self-pay

## 2022-02-23 ENCOUNTER — Other Ambulatory Visit (HOSPITAL_BASED_OUTPATIENT_CLINIC_OR_DEPARTMENT_OTHER): Payer: Self-pay

## 2022-02-23 MED ORDER — CEPHALEXIN 500 MG PO CAPS
500.0000 mg | ORAL_CAPSULE | Freq: Three times a day (TID) | ORAL | 0 refills | Status: DC
  Filled 2022-02-23: qty 30, 10d supply, fill #0

## 2022-02-23 MED ORDER — OXYCODONE-ACETAMINOPHEN 10-325 MG PO TABS
1.0000 | ORAL_TABLET | Freq: Four times a day (QID) | ORAL | 0 refills | Status: DC
  Filled 2022-02-23: qty 40, 10d supply, fill #0

## 2022-02-24 ENCOUNTER — Other Ambulatory Visit (HOSPITAL_BASED_OUTPATIENT_CLINIC_OR_DEPARTMENT_OTHER): Payer: Self-pay

## 2022-02-24 ENCOUNTER — Encounter: Payer: Self-pay | Admitting: Family Medicine

## 2022-03-07 ENCOUNTER — Other Ambulatory Visit: Payer: Self-pay

## 2022-03-07 ENCOUNTER — Other Ambulatory Visit (HOSPITAL_BASED_OUTPATIENT_CLINIC_OR_DEPARTMENT_OTHER): Payer: Self-pay

## 2022-03-07 MED ORDER — OXYCODONE-ACETAMINOPHEN 10-325 MG PO TABS
1.0000 | ORAL_TABLET | Freq: Four times a day (QID) | ORAL | 0 refills | Status: DC | PRN
  Filled 2022-03-07: qty 40, 10d supply, fill #0

## 2022-03-15 ENCOUNTER — Other Ambulatory Visit (HOSPITAL_BASED_OUTPATIENT_CLINIC_OR_DEPARTMENT_OTHER): Payer: Self-pay

## 2022-03-15 ENCOUNTER — Telehealth: Payer: Self-pay | Admitting: Family Medicine

## 2022-03-15 NOTE — Telephone Encounter (Signed)
Pt called to get refills on medications. Noticed that pt was dismissed from practice. After reviewing chart, advised pt that he was dismissed as of 11.14.23 and was out of his 30-day period to address any concerns with Dr. Nani Ravens as of 12.14.23. Pt wanted to know the reasoning to his dismissal. Verified with Shirlean Mylar that it was due to his NS rate and advised him that due to the amount of NS's he had (6), he had been dismissed. Advised him about our policy normally stating that after the third NS, a pt is normally dismissed.  Pt stated he would like to talk to Shirlean Mylar to see about getting this reversed. Per Shirlean Mylar, message needs to be routed to [place] to address. Pt was also advised of this information back on 12.8.23 (see telephone note).

## 2022-03-20 ENCOUNTER — Other Ambulatory Visit (HOSPITAL_BASED_OUTPATIENT_CLINIC_OR_DEPARTMENT_OTHER): Payer: Self-pay

## 2022-03-20 MED ORDER — OXYCODONE-ACETAMINOPHEN 10-325 MG PO TABS
1.0000 | ORAL_TABLET | Freq: Four times a day (QID) | ORAL | 0 refills | Status: DC | PRN
  Filled 2022-03-20: qty 40, 10d supply, fill #0

## 2022-03-21 ENCOUNTER — Other Ambulatory Visit (HOSPITAL_BASED_OUTPATIENT_CLINIC_OR_DEPARTMENT_OTHER): Payer: Self-pay

## 2022-03-21 NOTE — Telephone Encounter (Signed)
PCP ok to reverse dismissal.

## 2022-03-22 ENCOUNTER — Ambulatory Visit: Payer: BLUE CROSS/BLUE SHIELD | Attending: Family Medicine | Admitting: Physical Therapy

## 2022-03-22 ENCOUNTER — Other Ambulatory Visit (HOSPITAL_BASED_OUTPATIENT_CLINIC_OR_DEPARTMENT_OTHER): Payer: Self-pay

## 2022-03-22 DIAGNOSIS — R262 Difficulty in walking, not elsewhere classified: Secondary | ICD-10-CM | POA: Diagnosis present

## 2022-03-22 DIAGNOSIS — R29898 Other symptoms and signs involving the musculoskeletal system: Secondary | ICD-10-CM | POA: Insufficient documentation

## 2022-03-22 DIAGNOSIS — G8929 Other chronic pain: Secondary | ICD-10-CM

## 2022-03-22 DIAGNOSIS — M6281 Muscle weakness (generalized): Secondary | ICD-10-CM | POA: Diagnosis present

## 2022-03-22 DIAGNOSIS — R252 Cramp and spasm: Secondary | ICD-10-CM

## 2022-03-22 DIAGNOSIS — M545 Low back pain, unspecified: Secondary | ICD-10-CM | POA: Diagnosis not present

## 2022-03-22 DIAGNOSIS — M5442 Lumbago with sciatica, left side: Secondary | ICD-10-CM | POA: Diagnosis present
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
## 2022-03-23 NOTE — Telephone Encounter (Signed)
Called the patient to let patient know PCP will see him. The patient would like to schedule with Dr. Nani Ravens next week. He will need to work out transportation but will plan on calling back and getting on his schedule on 03/29/22 (Wednesday).

## 2022-03-23 NOTE — Telephone Encounter (Signed)
Yes. If he no shows again in the near future, we will have to dc him though.

## 2022-03-27 ENCOUNTER — Other Ambulatory Visit (HOSPITAL_BASED_OUTPATIENT_CLINIC_OR_DEPARTMENT_OTHER): Payer: Self-pay

## 2022-03-27 MED ORDER — OXYCODONE-ACETAMINOPHEN 10-325 MG PO TABS
1.0000 | ORAL_TABLET | Freq: Four times a day (QID) | ORAL | 0 refills | Status: DC | PRN
  Filled 2022-03-27 – 2022-03-30 (×2): qty 40, 10d supply, fill #0

## 2022-03-28 ENCOUNTER — Encounter: Payer: Self-pay | Admitting: Physical Therapy

## 2022-03-28 ENCOUNTER — Ambulatory Visit: Payer: BLUE CROSS/BLUE SHIELD | Admitting: Physical Therapy

## 2022-03-28 DIAGNOSIS — R252 Cramp and spasm: Secondary | ICD-10-CM

## 2022-03-28 DIAGNOSIS — M5442 Lumbago with sciatica, left side: Secondary | ICD-10-CM | POA: Diagnosis not present

## 2022-03-28 DIAGNOSIS — R262 Difficulty in walking, not elsewhere classified: Secondary | ICD-10-CM

## 2022-03-28 DIAGNOSIS — G8929 Other chronic pain: Secondary | ICD-10-CM

## 2022-03-28 DIAGNOSIS — M6281 Muscle weakness (generalized): Secondary | ICD-10-CM
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
## 2022-03-29 ENCOUNTER — Other Ambulatory Visit (HOSPITAL_BASED_OUTPATIENT_CLINIC_OR_DEPARTMENT_OTHER): Payer: Self-pay

## 2022-03-30 ENCOUNTER — Other Ambulatory Visit (HOSPITAL_BASED_OUTPATIENT_CLINIC_OR_DEPARTMENT_OTHER): Payer: Self-pay

## 2022-04-03 ENCOUNTER — Ambulatory Visit: Payer: BLUE CROSS/BLUE SHIELD

## 2022-04-03 ENCOUNTER — Other Ambulatory Visit (HOSPITAL_BASED_OUTPATIENT_CLINIC_OR_DEPARTMENT_OTHER): Payer: Self-pay

## 2022-04-05 ENCOUNTER — Ambulatory Visit: Payer: BLUE CROSS/BLUE SHIELD | Admitting: Physical Therapy

## 2022-04-10 ENCOUNTER — Other Ambulatory Visit (HOSPITAL_BASED_OUTPATIENT_CLINIC_OR_DEPARTMENT_OTHER): Payer: Self-pay

## 2022-04-10 MED ORDER — OXYCODONE-ACETAMINOPHEN 10-325 MG PO TABS
1.0000 | ORAL_TABLET | Freq: Four times a day (QID) | ORAL | 0 refills | Status: DC | PRN
  Filled 2022-04-10: qty 40, 10d supply, fill #0

## 2022-04-11 ENCOUNTER — Other Ambulatory Visit (HOSPITAL_BASED_OUTPATIENT_CLINIC_OR_DEPARTMENT_OTHER): Payer: Self-pay

## 2022-04-12 ENCOUNTER — Ambulatory Visit: Payer: BLUE CROSS/BLUE SHIELD

## 2022-04-12 DIAGNOSIS — M5442 Lumbago with sciatica, left side: Secondary | ICD-10-CM | POA: Diagnosis not present

## 2022-04-12 DIAGNOSIS — R252 Cramp and spasm: Secondary | ICD-10-CM

## 2022-04-12 DIAGNOSIS — M6281 Muscle weakness (generalized): Secondary | ICD-10-CM

## 2022-04-12 DIAGNOSIS — G8929 Other chronic pain: Secondary | ICD-10-CM

## 2022-04-12 DIAGNOSIS — R262 Difficulty in walking, not elsewhere classified: Secondary | ICD-10-CM
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
## 2022-04-17 ENCOUNTER — Ambulatory Visit: Payer: BLUE CROSS/BLUE SHIELD

## 2022-04-19 ENCOUNTER — Other Ambulatory Visit (HOSPITAL_BASED_OUTPATIENT_CLINIC_OR_DEPARTMENT_OTHER): Payer: Self-pay

## 2022-04-19 MED ORDER — OXYCODONE-ACETAMINOPHEN 10-325 MG PO TABS
1.0000 | ORAL_TABLET | Freq: Four times a day (QID) | ORAL | 0 refills | Status: DC | PRN
  Filled 2022-04-19: qty 40, 10d supply, fill #0

## 2022-04-20 ENCOUNTER — Ambulatory Visit: Payer: BLUE CROSS/BLUE SHIELD | Attending: Family Medicine | Admitting: Physical Therapy

## 2022-04-25 ENCOUNTER — Other Ambulatory Visit (HOSPITAL_BASED_OUTPATIENT_CLINIC_OR_DEPARTMENT_OTHER): Payer: Self-pay

## 2022-04-25 MED ORDER — OXYCODONE-ACETAMINOPHEN 10-325 MG PO TABS
1.0000 | ORAL_TABLET | Freq: Four times a day (QID) | ORAL | 0 refills | Status: DC | PRN
  Filled 2022-04-25 – 2022-04-28 (×2): qty 40, 10d supply, fill #0

## 2022-04-28 ENCOUNTER — Other Ambulatory Visit (HOSPITAL_BASED_OUTPATIENT_CLINIC_OR_DEPARTMENT_OTHER): Payer: Self-pay

## 2022-04-28 ENCOUNTER — Telehealth: Payer: Self-pay

## 2022-04-28 ENCOUNTER — Encounter: Payer: Self-pay | Admitting: Family Medicine

## 2022-04-28 ENCOUNTER — Ambulatory Visit (INDEPENDENT_AMBULATORY_CARE_PROVIDER_SITE_OTHER): Payer: BLUE CROSS/BLUE SHIELD | Admitting: Family Medicine

## 2022-04-28 ENCOUNTER — Other Ambulatory Visit: Payer: Self-pay | Admitting: Family Medicine

## 2022-04-28 VITALS — BP 136/94 | HR 66 | Temp 97.8°F | Ht 74.0 in | Wt 312.0 lb

## 2022-04-28 DIAGNOSIS — G8929 Other chronic pain: Secondary | ICD-10-CM

## 2022-04-28 DIAGNOSIS — B353 Tinea pedis: Secondary | ICD-10-CM | POA: Diagnosis not present

## 2022-04-28 DIAGNOSIS — E1169 Type 2 diabetes mellitus with other specified complication: Secondary | ICD-10-CM | POA: Diagnosis not present

## 2022-04-28 DIAGNOSIS — E669 Obesity, unspecified: Secondary | ICD-10-CM | POA: Diagnosis not present

## 2022-04-28 DIAGNOSIS — M545 Low back pain, unspecified: Secondary | ICD-10-CM | POA: Diagnosis not present

## 2022-04-28 DIAGNOSIS — I10 Essential (primary) hypertension: Secondary | ICD-10-CM

## 2022-04-28 LAB — COMPREHENSIVE METABOLIC PANEL
ALT: 16 U/L (ref 0–53)
AST: 17 U/L (ref 0–37)
Albumin: 4 g/dL (ref 3.5–5.2)
Alkaline Phosphatase: 43 U/L (ref 39–117)
BUN: 7 mg/dL (ref 6–23)
CO2: 27 mEq/L (ref 19–32)
Calcium: 8.8 mg/dL (ref 8.4–10.5)
Chloride: 102 mEq/L (ref 96–112)
Creatinine, Ser: 0.61 mg/dL (ref 0.40–1.50)
GFR: 111.3 mL/min (ref >60.00)
Glucose, Bld: 97 mg/dL (ref 70–99)
Potassium: 3.9 mEq/L (ref 3.5–5.1)
Sodium: 138 mEq/L (ref 135–145)
Total Bilirubin: 0.6 mg/dL (ref 0.2–1.2)
Total Protein: 7.6 g/dL (ref 6.0–8.3)

## 2022-04-28 LAB — HEMOGLOBIN A1C: Hgb A1c MFr Bld: 5.8 % (ref 4.6–6.5)

## 2022-04-28 LAB — LIPID PANEL
Cholesterol: 188 mg/dL (ref 0–200)
HDL: 58.3 mg/dL (ref >39.00)
LDL Cholesterol: 109 mg/dL — ABNORMAL HIGH (ref 0–99)
NonHDL: 129.75
Total CHOL/HDL Ratio: 3
Triglycerides: 102 mg/dL (ref 0.0–149.0)
VLDL: 20.4 mg/dL (ref 0.0–40.0)

## 2022-04-28 MED ORDER — ACCU-CHEK SOFTCLIX LANCETS MISC
3 refills | Status: AC
  Filled 2022-04-28: qty 100, 90d supply, fill #0

## 2022-04-28 MED ORDER — TRULICITY 1.5 MG/0.5ML ~~LOC~~ SOAJ
1.5000 mg | SUBCUTANEOUS | 8 refills | Status: DC
  Filled 2022-04-28: qty 2, 28d supply, fill #0
  Filled 2022-06-28 – 2022-07-04 (×2): qty 2, 28d supply, fill #1

## 2022-04-28 MED ORDER — ACCU-CHEK SOFTCLIX LANCETS MISC
3 refills | Status: AC
  Filled 2022-04-28: qty 100, fill #0
  Filled 2023-03-22: qty 100, 90d supply, fill #0

## 2022-04-28 MED ORDER — PRAVASTATIN SODIUM 40 MG PO TABS
40.0000 mg | ORAL_TABLET | Freq: Every day | ORAL | 8 refills | Status: DC
  Filled 2022-04-28: qty 30, 30d supply, fill #0
  Filled 2022-05-24 – 2022-06-28 (×2): qty 30, 30d supply, fill #1

## 2022-04-28 MED ORDER — KETOCONAZOLE 2 % EX CREA
1.0000 | TOPICAL_CREAM | Freq: Every day | CUTANEOUS | 0 refills | Status: AC
  Filled 2022-04-28: qty 30, 30d supply, fill #0

## 2022-04-28 MED ORDER — LISINOPRIL-HYDROCHLOROTHIAZIDE 20-25 MG PO TABS
1.0000 | ORAL_TABLET | Freq: Every day | ORAL | 8 refills | Status: DC
  Filled 2022-04-28: qty 30, 30d supply, fill #0
  Filled 2022-05-24 – 2022-06-28 (×2): qty 30, 30d supply, fill #1
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
## 2022-04-28 NOTE — Patient Instructions (Signed)
Give [place] 2-3 business days to get the results of your labs back.   Keep the diet clean and stay active.  Aim to do some physical exertion for 150 minutes per week. This is typically divided into 5 days per week, 30 minutes per day. The activity should be enough to get your heart rate up. Anything is better than nothing if you have time constraints.  If you do not hear anything about your referral in the next 1-2 weeks, call our office and ask for an update.  Let us know if you need anything.

## 2022-04-28 NOTE — Telephone Encounter (Signed)
PA initiated via Covermymeds; KEY: [id number]. PA approved.   Effective from 04/28/2022 through 04/27/2023.

## 2022-04-28 NOTE — Addendum Note (Signed)
Addended by: Manuela Schwartz on: 04/28/2022 03:57 PM   Modules accepted: Orders

## 2022-04-29 LAB — MICROALBUMIN / CREATININE URINE RATIO
Creatinine, Urine: 27 mg/dL (ref 20–320)
Microalb Creat Ratio: 7 mcg/mg creat (ref <30)
Microalb, Ur: 0.2 mg/dL

## 2022-05-01 ENCOUNTER — Other Ambulatory Visit (HOSPITAL_BASED_OUTPATIENT_CLINIC_OR_DEPARTMENT_OTHER): Payer: Self-pay

## 2022-05-08 ENCOUNTER — Other Ambulatory Visit (HOSPITAL_BASED_OUTPATIENT_CLINIC_OR_DEPARTMENT_OTHER): Payer: Self-pay

## 2022-05-08 MED ORDER — OXYCODONE-ACETAMINOPHEN 10-325 MG PO TABS
1.0000 | ORAL_TABLET | Freq: Four times a day (QID) | ORAL | 0 refills | Status: DC | PRN
  Filled 2022-05-08: qty 80, 20d supply, fill #0

## 2022-05-15 ENCOUNTER — Ambulatory Visit: Payer: BLUE CROSS/BLUE SHIELD | Admitting: Physical Therapy

## 2022-05-18 ENCOUNTER — Ambulatory Visit: Payer: Self-pay | Attending: Family Medicine

## 2022-05-23 ENCOUNTER — Encounter: Payer: BLUE CROSS/BLUE SHIELD | Admitting: Physical Therapy

## 2022-05-24 ENCOUNTER — Other Ambulatory Visit (HOSPITAL_BASED_OUTPATIENT_CLINIC_OR_DEPARTMENT_OTHER): Payer: Self-pay

## 2022-05-29 ENCOUNTER — Encounter: Payer: BLUE CROSS/BLUE SHIELD | Admitting: Physical Therapy

## 2022-06-01 ENCOUNTER — Other Ambulatory Visit (HOSPITAL_BASED_OUTPATIENT_CLINIC_OR_DEPARTMENT_OTHER): Payer: Self-pay

## 2022-06-05 ENCOUNTER — Encounter: Payer: BLUE CROSS/BLUE SHIELD | Admitting: Physical Therapy

## 2022-06-12 ENCOUNTER — Encounter: Payer: BLUE CROSS/BLUE SHIELD | Admitting: Physical Therapy

## 2022-06-20 ENCOUNTER — Ambulatory Visit: Payer: Self-pay | Attending: Family Medicine | Admitting: Physical Therapy

## 2022-06-28 ENCOUNTER — Other Ambulatory Visit (HOSPITAL_BASED_OUTPATIENT_CLINIC_OR_DEPARTMENT_OTHER): Payer: Self-pay

## 2022-06-28 ENCOUNTER — Other Ambulatory Visit: Payer: Self-pay | Admitting: Family Medicine

## 2022-06-28 ENCOUNTER — Other Ambulatory Visit: Payer: Self-pay

## 2022-06-28 DIAGNOSIS — F418 Other specified anxiety disorders: Secondary | ICD-10-CM

## 2022-06-28 MED ORDER — NORTRIPTYLINE HCL 25 MG PO CAPS
25.0000 mg | ORAL_CAPSULE | Freq: Every day | ORAL | 2 refills | Status: DC
  Filled 2022-06-28: qty 30, 30d supply, fill #0

## 2022-07-03 ENCOUNTER — Ambulatory Visit: Payer: Self-pay | Admitting: Family Medicine

## 2022-07-04 ENCOUNTER — Ambulatory Visit (INDEPENDENT_AMBULATORY_CARE_PROVIDER_SITE_OTHER): Payer: Self-pay | Admitting: Family Medicine

## 2022-07-04 ENCOUNTER — Encounter (HOSPITAL_BASED_OUTPATIENT_CLINIC_OR_DEPARTMENT_OTHER): Payer: Self-pay

## 2022-07-04 ENCOUNTER — Encounter: Payer: Self-pay | Admitting: Family Medicine

## 2022-07-04 ENCOUNTER — Other Ambulatory Visit (HOSPITAL_BASED_OUTPATIENT_CLINIC_OR_DEPARTMENT_OTHER): Payer: Self-pay

## 2022-07-04 VITALS — BP 134/86 | HR 102 | Temp 98.3°F | Ht 75.0 in | Wt 343.0 lb

## 2022-07-04 DIAGNOSIS — E669 Obesity, unspecified: Secondary | ICD-10-CM

## 2022-07-04 DIAGNOSIS — E1169 Type 2 diabetes mellitus with other specified complication: Secondary | ICD-10-CM

## 2022-07-04 DIAGNOSIS — R109 Unspecified abdominal pain: Secondary | ICD-10-CM

## 2022-07-04 MED ORDER — METHYLPREDNISOLONE ACETATE 80 MG/ML IJ SUSP
80.0000 mg | Freq: Once | INTRAMUSCULAR | Status: AC
  Administered 2022-07-04: 80 mg via INTRAMUSCULAR

## 2022-07-04 MED ORDER — TRAMADOL HCL 50 MG PO TABS
100.0000 mg | ORAL_TABLET | Freq: Two times a day (BID) | ORAL | 0 refills | Status: DC | PRN
  Filled 2022-07-04: qty 30, 8d supply, fill #0

## 2022-07-04 MED ORDER — MELOXICAM 15 MG PO TABS
15.0000 mg | ORAL_TABLET | Freq: Every day | ORAL | 0 refills | Status: DC
  Filled 2022-07-04: qty 30, 30d supply, fill #0

## 2022-07-04 MED ORDER — SEMAGLUTIDE (1 MG/DOSE) 4 MG/3ML ~~LOC~~ SOPN
1.0000 mg | PEN_INJECTOR | SUBCUTANEOUS | 2 refills | Status: DC
  Filled 2022-07-04: qty 3, 28d supply, fill #0

## 2022-07-04 MED ORDER — OZEMPIC (0.25 OR 0.5 MG/DOSE) 2 MG/1.5ML ~~LOC~~ SOPN
PEN_INJECTOR | SUBCUTANEOUS | 1 refills | Status: DC
  Filled 2022-07-04: qty 1.5, 35d supply, fill #0

## 2022-07-04 NOTE — Patient Instructions (Signed)
Heat (pad or rice pillow in microwave) over affected area, 10-15 minutes twice daily.   Ice/cold pack over area for 10-15 min twice daily.  OK to take Tylenol 1000 mg (2 extra strength tabs) or 975 mg (3 regular strength tabs) every 6 hours as needed.  Let us know if you need anything.  Do your home exercises/stretches from PT.
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
## 2022-07-04 NOTE — Addendum Note (Signed)
Addended by: Scharlene Gloss B on: 07/04/2022 04:42 PM   Modules accepted: Orders

## 2022-07-11 ENCOUNTER — Other Ambulatory Visit (HOSPITAL_BASED_OUTPATIENT_CLINIC_OR_DEPARTMENT_OTHER): Payer: Self-pay

## 2022-08-08 ENCOUNTER — Other Ambulatory Visit: Payer: Self-pay | Admitting: Family Medicine

## 2022-08-08 ENCOUNTER — Telehealth: Payer: Self-pay

## 2022-08-08 ENCOUNTER — Encounter: Payer: Self-pay | Admitting: Family Medicine

## 2022-08-08 ENCOUNTER — Ambulatory Visit (INDEPENDENT_AMBULATORY_CARE_PROVIDER_SITE_OTHER): Payer: BLUE CROSS/BLUE SHIELD | Admitting: Family Medicine

## 2022-08-08 ENCOUNTER — Other Ambulatory Visit (HOSPITAL_BASED_OUTPATIENT_CLINIC_OR_DEPARTMENT_OTHER): Payer: Self-pay

## 2022-08-08 VITALS — BP 128/86 | HR 74 | Temp 98.0°F | Ht 74.5 in | Wt 333.0 lb

## 2022-08-08 DIAGNOSIS — Z7985 Long-term (current) use of injectable non-insulin antidiabetic drugs: Secondary | ICD-10-CM | POA: Diagnosis not present

## 2022-08-08 DIAGNOSIS — G8929 Other chronic pain: Secondary | ICD-10-CM

## 2022-08-08 DIAGNOSIS — Z6841 Body Mass Index (BMI) 40.0 and over, adult: Secondary | ICD-10-CM

## 2022-08-08 DIAGNOSIS — M545 Low back pain, unspecified: Secondary | ICD-10-CM | POA: Diagnosis not present

## 2022-08-08 DIAGNOSIS — E1169 Type 2 diabetes mellitus with other specified complication: Secondary | ICD-10-CM | POA: Diagnosis not present

## 2022-08-08 DIAGNOSIS — I10 Essential (primary) hypertension: Secondary | ICD-10-CM

## 2022-08-08 DIAGNOSIS — F418 Other specified anxiety disorders: Secondary | ICD-10-CM

## 2022-08-08 DIAGNOSIS — E669 Obesity, unspecified: Secondary | ICD-10-CM

## 2022-08-08 MED ORDER — PRAVASTATIN SODIUM 40 MG PO TABS
40.0000 mg | ORAL_TABLET | Freq: Every day | ORAL | 8 refills | Status: DC
Start: 2022-08-08 — End: 2022-09-15
  Filled 2022-08-08: qty 30, 30d supply, fill #0

## 2022-08-08 MED ORDER — TIRZEPATIDE 5 MG/0.5ML ~~LOC~~ SOPN
5.0000 mg | PEN_INJECTOR | SUBCUTANEOUS | 0 refills | Status: AC
Start: 2022-08-08 — End: 2022-09-07
  Filled 2022-08-08: qty 2, 28d supply, fill #0

## 2022-08-08 MED ORDER — TIRZEPATIDE 7.5 MG/0.5ML ~~LOC~~ SOAJ
7.5000 mg | SUBCUTANEOUS | 0 refills | Status: DC
Start: 2022-09-05 — End: 2022-11-28
  Filled 2022-08-08 – 2022-11-01 (×2): qty 2, 28d supply, fill #0

## 2022-08-08 MED ORDER — TRAMADOL HCL 50 MG PO TABS
100.00 mg | ORAL_TABLET | Freq: Two times a day (BID) | ORAL | 0 refills | Status: DC | PRN
Start: 2022-08-08 — End: 2022-09-15
  Filled 2022-08-08: qty 30, 7d supply, fill #0

## 2022-08-08 MED ORDER — TIRZEPATIDE 10 MG/0.5ML ~~LOC~~ SOAJ
10.0000 mg | SUBCUTANEOUS | 0 refills | Status: DC
Start: 2022-10-03 — End: 2022-11-28
  Filled 2022-08-08 – 2022-11-28 (×2): qty 2, 28d supply, fill #0

## 2022-08-08 MED ORDER — LISINOPRIL-HYDROCHLOROTHIAZIDE 20-25 MG PO TABS
1.0000 | ORAL_TABLET | Freq: Every day | ORAL | 8 refills | Status: DC
Start: 2022-08-08 — End: 2022-09-15
  Filled 2022-08-08: qty 30, 30d supply, fill #0

## 2022-08-08 MED ORDER — NORTRIPTYLINE HCL 25 MG PO CAPS
25.0000 mg | ORAL_CAPSULE | Freq: Every day | ORAL | 8 refills | Status: DC
Start: 2022-08-08 — End: 2022-09-15
  Filled 2022-08-08: qty 30, 30d supply, fill #0

## 2022-08-08 NOTE — Patient Instructions (Signed)
Keep the diet clean and stay active.  If you do not hear anything about your referral in the next 1-2 weeks, call our office and ask for an update.  Let us know if you need anything.  
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
## 2022-08-08 NOTE — Telephone Encounter (Signed)
PA initiated via Covermymeds; KEY: [id number]. Awaiting determination.

## 2022-08-09 ENCOUNTER — Other Ambulatory Visit (HOSPITAL_BASED_OUTPATIENT_CLINIC_OR_DEPARTMENT_OTHER): Payer: Self-pay

## 2022-08-10 ENCOUNTER — Other Ambulatory Visit (HOSPITAL_BASED_OUTPATIENT_CLINIC_OR_DEPARTMENT_OTHER): Payer: Self-pay

## 2022-08-10 NOTE — Telephone Encounter (Signed)
PA approved.

## 2022-08-24 ENCOUNTER — Telehealth: Payer: Self-pay | Admitting: Family Medicine

## 2022-08-24 NOTE — Telephone Encounter (Signed)
Mark Rich ([facility]) called stating that pt had brought a [employer] Quick Draw Back Brace to his appt with them to get a replacement. However they are unable to submit that order and it would have to be done by PCP.

## 2022-08-29 ENCOUNTER — Encounter: Payer: Self-pay | Admitting: Family Medicine

## 2022-08-29 ENCOUNTER — Telehealth: Payer: Self-pay | Admitting: Family Medicine

## 2022-08-29 NOTE — Telephone Encounter (Signed)
Letter completed Patient informed and will pickup at the front office
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
## 2022-09-15 ENCOUNTER — Other Ambulatory Visit: Payer: Self-pay

## 2022-09-15 ENCOUNTER — Other Ambulatory Visit: Payer: Self-pay | Admitting: Family Medicine

## 2022-09-15 ENCOUNTER — Other Ambulatory Visit (HOSPITAL_BASED_OUTPATIENT_CLINIC_OR_DEPARTMENT_OTHER): Payer: Self-pay

## 2022-09-15 DIAGNOSIS — F418 Other specified anxiety disorders: Secondary | ICD-10-CM

## 2022-09-15 DIAGNOSIS — G8929 Other chronic pain: Secondary | ICD-10-CM

## 2022-09-15 DIAGNOSIS — I10 Essential (primary) hypertension: Secondary | ICD-10-CM

## 2022-09-15 DIAGNOSIS — E669 Obesity, unspecified: Secondary | ICD-10-CM

## 2022-09-15 MED ORDER — LISINOPRIL-HYDROCHLOROTHIAZIDE 20-25 MG PO TABS
1.0000 | ORAL_TABLET | Freq: Every day | ORAL | 8 refills | Status: DC
Start: 2022-09-15 — End: 2023-06-13
  Filled 2022-09-15: qty 30, 30d supply, fill #0
  Filled 2022-11-01: qty 30, 30d supply, fill #1
  Filled 2022-11-28: qty 30, 30d supply, fill #2
  Filled 2022-12-25: qty 30, 30d supply, fill #3
  Filled 2023-01-24: qty 30, 30d supply, fill #4
  Filled 2023-03-01: qty 30, 30d supply, fill #5
  Filled 2023-03-22: qty 30, 30d supply, fill #6
  Filled 2023-04-17: qty 30, 30d supply, fill #7
  Filled 2023-05-14: qty 30, 30d supply, fill #8

## 2022-09-15 MED ORDER — TRAMADOL HCL 50 MG PO TABS
100.0000 mg | ORAL_TABLET | Freq: Two times a day (BID) | ORAL | 2 refills | Status: AC | PRN
Start: 2022-09-15 — End: ?
  Filled 2022-09-15: qty 30, 8d supply, fill #0
  Filled 2022-11-01: qty 30, 8d supply, fill #1
  Filled 2022-11-29: qty 30, 8d supply, fill #2

## 2022-09-15 MED ORDER — PRAVASTATIN SODIUM 40 MG PO TABS
40.0000 mg | ORAL_TABLET | Freq: Every day | ORAL | 8 refills | Status: DC
Start: 2022-09-15 — End: 2023-08-03
  Filled 2022-09-15: qty 30, 30d supply, fill #0
  Filled 2022-11-01: qty 30, 30d supply, fill #1
  Filled 2022-11-28: qty 30, 30d supply, fill #2
  Filled 2022-12-25: qty 30, 30d supply, fill #3
  Filled 2023-01-24: qty 30, 30d supply, fill #4
  Filled 2023-03-01: qty 30, 30d supply, fill #5
  Filled 2023-03-22: qty 30, 30d supply, fill #6
  Filled 2023-05-07 – 2023-06-13 (×3): qty 30, 30d supply, fill #7
  Filled 2023-07-06: qty 30, 30d supply, fill #8

## 2022-09-15 MED ORDER — NORTRIPTYLINE HCL 25 MG PO CAPS
25.0000 mg | ORAL_CAPSULE | Freq: Every day | ORAL | 8 refills | Status: DC
Start: 2022-09-15 — End: 2023-04-03
  Filled 2022-09-15: qty 30, 30d supply, fill #0
  Filled 2022-11-01: qty 30, 30d supply, fill #1
  Filled 2022-11-28: qty 30, 30d supply, fill #2
  Filled 2022-12-25: qty 30, 30d supply, fill #3
  Filled 2023-01-24: qty 30, 30d supply, fill #4
  Filled 2023-03-01: qty 30, 30d supply, fill #5

## 2022-09-15 NOTE — Telephone Encounter (Signed)
Last OV--08/08/22 Last RF--#30 no refills on 08/08/22. Mounjaro refills should be at pharmacy

## 2022-09-15 NOTE — Telephone Encounter (Signed)
Medication: mounjaro  pravastatin (PRAVACHOL) 40 MG tablet  lisinopril-hydrochlorothiazide (ZESTORETIC) 20-25 MG tablet  traMADol (ULTRAM) 50 MG tablet  nortriptyline (PAMELOR) 25 MG capsule  Has the patient contacted their pharmacy? No.   Preferred Pharmacy:   [facility] [place] [facility] [address] [place] [address] Phone: [phone]  Fax: [phone]

## 2022-09-26 ENCOUNTER — Other Ambulatory Visit: Payer: Self-pay

## 2022-09-29 ENCOUNTER — Other Ambulatory Visit: Payer: BLUE CROSS/BLUE SHIELD

## 2022-10-20 ENCOUNTER — Other Ambulatory Visit: Payer: Self-pay | Admitting: Family Medicine

## 2022-10-20 ENCOUNTER — Telehealth: Payer: Self-pay | Admitting: Family Medicine

## 2022-10-20 DIAGNOSIS — G8929 Other chronic pain: Secondary | ICD-10-CM

## 2022-10-20 NOTE — Telephone Encounter (Signed)
Pt states his PT told him he would like pcp to order 8 more weeks of PT.

## 2022-10-20 NOTE — Telephone Encounter (Signed)
Pt called to check on status of referral. After reviewing chart noted that referral was routed to the wrong practice and needed to be routed to the following:  [facility]  [address] [place], [place], [place] [address] Hours:  Open ? Closes 6?PM Phone: [phone]  Pt is attempting to get an appt for next Monday (8.12.24) if possible and needs reroute when possible.

## 2022-10-20 NOTE — Telephone Encounter (Signed)
done

## 2022-10-23 NOTE — Telephone Encounter (Signed)
Pt called stating that the referral needed to be faxed directly to the office he is planning to go to at:  [facility] [address], [place], [place] [address] P: [phone] F: [phone]

## 2022-11-01 ENCOUNTER — Telehealth: Payer: Self-pay | Admitting: Family Medicine

## 2022-11-01 ENCOUNTER — Other Ambulatory Visit (HOSPITAL_BASED_OUTPATIENT_CLINIC_OR_DEPARTMENT_OTHER): Payer: Self-pay

## 2022-11-01 NOTE — Telephone Encounter (Signed)
Completed and called the patient can pickup at the front desk.

## 2022-11-01 NOTE — Telephone Encounter (Signed)
Pt dropped off handicap placard form to be completed by PCP. Pt asks to be called when ready to pick up form.

## 2022-11-17 ENCOUNTER — Other Ambulatory Visit: Payer: Self-pay

## 2022-11-23 ENCOUNTER — Telehealth: Payer: Self-pay | Admitting: Family Medicine

## 2022-11-23 NOTE — Telephone Encounter (Signed)
Domingo Mend from [place] [employer] called to speak with CMA to find out if patient is still unable to work. The last notification is 10/2021. Pt said he had visit in February. Please call her back at [phone].

## 2022-11-24 NOTE — Telephone Encounter (Signed)
Called DSS and informed of PCP response to their request.

## 2022-11-24 NOTE — Telephone Encounter (Signed)
Certainly unable to do any physical labor. Ty.

## 2022-11-28 ENCOUNTER — Encounter: Payer: Self-pay | Admitting: Family Medicine

## 2022-11-28 ENCOUNTER — Other Ambulatory Visit (HOSPITAL_BASED_OUTPATIENT_CLINIC_OR_DEPARTMENT_OTHER): Payer: Self-pay

## 2022-11-28 ENCOUNTER — Other Ambulatory Visit: Payer: Self-pay

## 2022-11-28 ENCOUNTER — Other Ambulatory Visit: Payer: Self-pay | Admitting: Family Medicine

## 2022-11-28 ENCOUNTER — Ambulatory Visit (INDEPENDENT_AMBULATORY_CARE_PROVIDER_SITE_OTHER): Payer: BLUE CROSS/BLUE SHIELD | Admitting: Family Medicine

## 2022-11-28 VITALS — BP 120/84 | HR 111 | Temp 98.9°F | Ht 75.0 in | Wt 326.0 lb

## 2022-11-28 DIAGNOSIS — E1169 Type 2 diabetes mellitus with other specified complication: Secondary | ICD-10-CM

## 2022-11-28 DIAGNOSIS — I10 Essential (primary) hypertension: Secondary | ICD-10-CM

## 2022-11-28 DIAGNOSIS — Z125 Encounter for screening for malignant neoplasm of prostate: Secondary | ICD-10-CM | POA: Diagnosis not present

## 2022-11-28 DIAGNOSIS — N529 Male erectile dysfunction, unspecified: Secondary | ICD-10-CM | POA: Diagnosis not present

## 2022-11-28 DIAGNOSIS — M79674 Pain in right toe(s): Secondary | ICD-10-CM

## 2022-11-28 DIAGNOSIS — Z7409 Other reduced mobility: Secondary | ICD-10-CM | POA: Insufficient documentation

## 2022-11-28 DIAGNOSIS — E7800 Pure hypercholesterolemia, unspecified: Secondary | ICD-10-CM | POA: Diagnosis not present

## 2022-11-28 DIAGNOSIS — E669 Obesity, unspecified: Secondary | ICD-10-CM | POA: Diagnosis not present

## 2022-11-28 LAB — LIPID PANEL
Cholesterol: 123 mg/dL (ref 0–200)
HDL: 31.3 mg/dL — ABNORMAL LOW (ref >39.00)
LDL Cholesterol: 71 mg/dL (ref 0–99)
NonHDL: 91.85
Total CHOL/HDL Ratio: 4
Triglycerides: 103 mg/dL (ref 0.0–149.0)
VLDL: 20.6 mg/dL (ref 0.0–40.0)

## 2022-11-28 LAB — COMPREHENSIVE METABOLIC PANEL
ALT: 9 U/L (ref 0–53)
AST: 12 U/L (ref 0–37)
Albumin: 3.8 g/dL (ref 3.5–5.2)
Alkaline Phosphatase: 38 U/L — ABNORMAL LOW (ref 39–117)
BUN: 15 mg/dL (ref 6–23)
CO2: 25 meq/L (ref 19–32)
Calcium: 9.7 mg/dL (ref 8.4–10.5)
Chloride: 100 meq/L (ref 96–112)
Creatinine, Ser: 0.93 mg/dL (ref 0.40–1.50)
GFR: 94.76 mL/min (ref >60.00)
Glucose, Bld: 106 mg/dL — ABNORMAL HIGH (ref 70–99)
Potassium: 4 meq/L (ref 3.5–5.1)
Sodium: 133 meq/L — ABNORMAL LOW (ref 135–145)
Total Bilirubin: 0.4 mg/dL (ref 0.2–1.2)
Total Protein: 7.5 g/dL (ref 6.0–8.3)

## 2022-11-28 LAB — PSA: PSA: 0.21 ng/mL (ref 0.10–4.00)

## 2022-11-28 LAB — HEMOGLOBIN A1C: Hgb A1c MFr Bld: 6 % (ref 4.6–6.5)

## 2022-11-28 MED ORDER — SILDENAFIL CITRATE 100 MG PO TABS
50.0000 mg | ORAL_TABLET | Freq: Every day | ORAL | 0 refills | Status: DC | PRN
Start: 2022-11-28 — End: 2023-04-03
  Filled 2022-11-28: qty 10, 10d supply, fill #0

## 2022-11-28 MED ORDER — TIRZEPATIDE 12.5 MG/0.5ML ~~LOC~~ SOPN
12.5000 mg | PEN_INJECTOR | SUBCUTANEOUS | 0 refills | Status: AC
Start: 2022-11-28 — End: 2022-12-26
  Filled 2022-11-28: qty 2, 28d supply, fill #0

## 2022-11-28 MED ORDER — TIRZEPATIDE 15 MG/0.5ML ~~LOC~~ SOAJ
15.0000 mg | SUBCUTANEOUS | 2 refills | Status: DC
Start: 2022-11-28 — End: 2023-11-26
  Filled 2022-11-28: qty 6, 84d supply, fill #0
  Filled 2022-12-26: qty 2, 28d supply, fill #0
  Filled 2023-01-24: qty 2, 28d supply, fill #1
  Filled 2023-02-12 – 2023-02-14 (×2): qty 2, 28d supply, fill #2
  Filled 2023-03-15: qty 2, 28d supply, fill #4
  Filled 2023-03-15: qty 2, 28d supply, fill #3
  Filled 2023-04-30: qty 2, 28d supply, fill #4
  Filled 2023-06-13: qty 2, 28d supply, fill #5
  Filled 2023-07-06: qty 2, 28d supply, fill #6
  Filled 2023-08-20: qty 2, 28d supply, fill #7
  Filled 2023-10-29: qty 2, 28d supply, fill #8

## 2022-11-28 NOTE — Patient Instructions (Addendum)
Take Metamucil or Benefiber daily.  Give [place] 2-3 business days to get the results of your labs back.   Keep the diet clean and stay active.  Strong work with your weight loss.   Use GoodRx for your sildenafil.   If you do not hear anything about your referral in the next 1-2 weeks, call our office and ask for an update.  Let us know if you need anything.
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
## 2022-11-29 ENCOUNTER — Other Ambulatory Visit (HOSPITAL_BASED_OUTPATIENT_CLINIC_OR_DEPARTMENT_OTHER): Payer: Self-pay

## 2022-12-12 ENCOUNTER — Other Ambulatory Visit: Payer: Self-pay | Admitting: Family Medicine

## 2022-12-12 ENCOUNTER — Other Ambulatory Visit (HOSPITAL_BASED_OUTPATIENT_CLINIC_OR_DEPARTMENT_OTHER): Payer: Self-pay

## 2022-12-12 DIAGNOSIS — G8929 Other chronic pain: Secondary | ICD-10-CM

## 2022-12-12 MED ORDER — TRAMADOL HCL 50 MG PO TABS
100.0000 mg | ORAL_TABLET | Freq: Two times a day (BID) | ORAL | 2 refills | Status: DC | PRN
  Filled 2022-12-12: qty 30, 8d supply, fill #0
  Filled 2022-12-25: qty 30, 8d supply, fill #1
  Filled 2023-01-04: qty 30, 8d supply, fill #2

## 2022-12-12 NOTE — Telephone Encounter (Signed)
Last OV--11/28/22 Last RF--#30 with 2 refills on 09/15/22

## 2022-12-25 ENCOUNTER — Other Ambulatory Visit (HOSPITAL_BASED_OUTPATIENT_CLINIC_OR_DEPARTMENT_OTHER): Payer: Self-pay

## 2022-12-26 ENCOUNTER — Other Ambulatory Visit (HOSPITAL_BASED_OUTPATIENT_CLINIC_OR_DEPARTMENT_OTHER): Payer: Self-pay

## 2023-01-04 ENCOUNTER — Other Ambulatory Visit (HOSPITAL_BASED_OUTPATIENT_CLINIC_OR_DEPARTMENT_OTHER): Payer: Self-pay

## 2023-01-12 ENCOUNTER — Other Ambulatory Visit: Payer: Self-pay | Admitting: Family Medicine

## 2023-01-12 ENCOUNTER — Other Ambulatory Visit (HOSPITAL_BASED_OUTPATIENT_CLINIC_OR_DEPARTMENT_OTHER): Payer: Self-pay

## 2023-01-12 DIAGNOSIS — M545 Low back pain, unspecified: Secondary | ICD-10-CM

## 2023-01-12 MED ORDER — TRAMADOL HCL 50 MG PO TABS
100.0000 mg | ORAL_TABLET | Freq: Two times a day (BID) | ORAL | 2 refills | Status: DC | PRN
Start: 2023-01-12 — End: 2023-02-23
  Filled 2023-01-12: qty 30, 8d supply, fill #0
  Filled 2023-01-24: qty 30, 8d supply, fill #1
  Filled 2023-02-09: qty 30, 8d supply, fill #2

## 2023-01-12 NOTE — Telephone Encounter (Signed)
Requesting: tramadol 50mg   Contract: None UDS: None Last Visit: 11/28/22 Next Visit: None Last Refill: 12/12/22 #30 and 0RF   Please Advise

## 2023-01-24 ENCOUNTER — Other Ambulatory Visit: Payer: Self-pay | Admitting: Family Medicine

## 2023-01-24 ENCOUNTER — Other Ambulatory Visit (HOSPITAL_BASED_OUTPATIENT_CLINIC_OR_DEPARTMENT_OTHER): Payer: Self-pay

## 2023-01-24 MED ORDER — CYCLOBENZAPRINE HCL 10 MG PO TABS
10.0000 mg | ORAL_TABLET | Freq: Three times a day (TID) | ORAL | 0 refills | Status: DC | PRN
  Filled 2023-01-24: qty 40, 14d supply, fill #0

## 2023-01-29 ENCOUNTER — Other Ambulatory Visit (HOSPITAL_BASED_OUTPATIENT_CLINIC_OR_DEPARTMENT_OTHER): Payer: Self-pay

## 2023-01-29 MED ORDER — OXYCODONE-ACETAMINOPHEN 5-325 MG PO TABS
1.0000 | ORAL_TABLET | Freq: Four times a day (QID) | ORAL | 0 refills | Status: DC | PRN
  Filled 2023-01-29: qty 50, 13d supply, fill #0

## 2023-02-09 ENCOUNTER — Other Ambulatory Visit (HOSPITAL_BASED_OUTPATIENT_CLINIC_OR_DEPARTMENT_OTHER): Payer: Self-pay

## 2023-02-12 ENCOUNTER — Other Ambulatory Visit (HOSPITAL_BASED_OUTPATIENT_CLINIC_OR_DEPARTMENT_OTHER): Payer: Self-pay

## 2023-02-13 ENCOUNTER — Other Ambulatory Visit (HOSPITAL_BASED_OUTPATIENT_CLINIC_OR_DEPARTMENT_OTHER): Payer: Self-pay

## 2023-02-14 ENCOUNTER — Other Ambulatory Visit (HOSPITAL_BASED_OUTPATIENT_CLINIC_OR_DEPARTMENT_OTHER): Payer: Self-pay

## 2023-02-23 ENCOUNTER — Other Ambulatory Visit (HOSPITAL_BASED_OUTPATIENT_CLINIC_OR_DEPARTMENT_OTHER): Payer: Self-pay

## 2023-02-23 ENCOUNTER — Other Ambulatory Visit: Payer: Self-pay | Admitting: Family Medicine

## 2023-02-23 DIAGNOSIS — G8929 Other chronic pain: Secondary | ICD-10-CM

## 2023-02-26 ENCOUNTER — Other Ambulatory Visit (HOSPITAL_BASED_OUTPATIENT_CLINIC_OR_DEPARTMENT_OTHER): Payer: Self-pay

## 2023-02-26 ENCOUNTER — Other Ambulatory Visit: Payer: Self-pay

## 2023-02-26 MED ORDER — TRAMADOL HCL 50 MG PO TABS
100.0000 mg | ORAL_TABLET | Freq: Two times a day (BID) | ORAL | 2 refills | Status: DC | PRN
  Filled 2023-02-26: qty 30, 8d supply, fill #0
  Filled 2023-03-15: qty 30, 7d supply, fill #1
  Filled 2023-03-22: qty 30, 7d supply, fill #2

## 2023-02-27 ENCOUNTER — Other Ambulatory Visit (HOSPITAL_BASED_OUTPATIENT_CLINIC_OR_DEPARTMENT_OTHER): Payer: Self-pay

## 2023-03-01 ENCOUNTER — Other Ambulatory Visit: Payer: Self-pay

## 2023-03-01 ENCOUNTER — Other Ambulatory Visit (HOSPITAL_BASED_OUTPATIENT_CLINIC_OR_DEPARTMENT_OTHER): Payer: Self-pay

## 2023-03-02 ENCOUNTER — Other Ambulatory Visit (HOSPITAL_BASED_OUTPATIENT_CLINIC_OR_DEPARTMENT_OTHER): Payer: Self-pay

## 2023-03-02 MED ORDER — TRAMADOL HCL 50 MG PO TABS
50.0000 mg | ORAL_TABLET | Freq: Four times a day (QID) | ORAL | 0 refills | Status: DC | PRN
  Filled 2023-03-02 – 2023-03-05 (×3): qty 20, 5d supply, fill #0

## 2023-03-05 ENCOUNTER — Other Ambulatory Visit (HOSPITAL_BASED_OUTPATIENT_CLINIC_OR_DEPARTMENT_OTHER): Payer: Self-pay

## 2023-03-12 ENCOUNTER — Other Ambulatory Visit (HOSPITAL_BASED_OUTPATIENT_CLINIC_OR_DEPARTMENT_OTHER): Payer: Self-pay

## 2023-03-15 ENCOUNTER — Other Ambulatory Visit (HOSPITAL_BASED_OUTPATIENT_CLINIC_OR_DEPARTMENT_OTHER): Payer: Self-pay

## 2023-03-15 ENCOUNTER — Telehealth: Payer: Self-pay

## 2023-03-15 NOTE — Telephone Encounter (Signed)
 PA initiated via Covermymeds; KEY: [id number].  Prior Authorization Not Required

## 2023-03-22 ENCOUNTER — Other Ambulatory Visit (HOSPITAL_BASED_OUTPATIENT_CLINIC_OR_DEPARTMENT_OTHER): Payer: Self-pay

## 2023-03-27 ENCOUNTER — Other Ambulatory Visit: Payer: Self-pay | Admitting: Family Medicine

## 2023-03-27 ENCOUNTER — Other Ambulatory Visit (HOSPITAL_BASED_OUTPATIENT_CLINIC_OR_DEPARTMENT_OTHER): Payer: Self-pay

## 2023-03-27 ENCOUNTER — Telehealth: Payer: Self-pay | Admitting: Family Medicine

## 2023-03-27 DIAGNOSIS — M545 Low back pain, unspecified: Secondary | ICD-10-CM

## 2023-03-27 MED ORDER — TRAMADOL HCL 50 MG PO TABS
100.0000 mg | ORAL_TABLET | Freq: Two times a day (BID) | ORAL | 2 refills | Status: DC | PRN
  Filled 2023-03-27: qty 30, 7d supply, fill #0

## 2023-03-27 NOTE — Telephone Encounter (Signed)
 Paperwork will be placed in providers bin to review.

## 2023-03-27 NOTE — Telephone Encounter (Signed)
 Pt dropped off paperwork to be signed by pcp. Pt asks to pick u on his appt Friday at 10:45.

## 2023-03-27 NOTE — Telephone Encounter (Signed)
 Requesting: tramadol 50mg  Contract: None UDS: None Last Visit: 11/28/22 Next Visit: None Last Refill: 02/26/23 #30 and 0RF   Please Advise

## 2023-03-28 ENCOUNTER — Telehealth: Payer: Self-pay

## 2023-03-28 NOTE — Telephone Encounter (Signed)
 Called pt was advised form was ready for Parking placard.Pt said he will get it at visit on Friday.

## 2023-03-30 ENCOUNTER — Ambulatory Visit: Payer: BLUE CROSS/BLUE SHIELD | Admitting: Family Medicine

## 2023-04-03 ENCOUNTER — Ambulatory Visit (INDEPENDENT_AMBULATORY_CARE_PROVIDER_SITE_OTHER): Payer: No Typology Code available for payment source | Admitting: Family Medicine

## 2023-04-03 ENCOUNTER — Encounter: Payer: Self-pay | Admitting: Family Medicine

## 2023-04-03 ENCOUNTER — Other Ambulatory Visit (HOSPITAL_BASED_OUTPATIENT_CLINIC_OR_DEPARTMENT_OTHER): Payer: Self-pay

## 2023-04-03 VITALS — BP 124/84 | HR 90 | Temp 98.0°F | Resp 16 | Ht 75.0 in | Wt 296.0 lb

## 2023-04-03 DIAGNOSIS — I10 Essential (primary) hypertension: Secondary | ICD-10-CM

## 2023-04-03 DIAGNOSIS — M545 Low back pain, unspecified: Secondary | ICD-10-CM | POA: Diagnosis not present

## 2023-04-03 DIAGNOSIS — N529 Male erectile dysfunction, unspecified: Secondary | ICD-10-CM | POA: Diagnosis not present

## 2023-04-03 DIAGNOSIS — E1169 Type 2 diabetes mellitus with other specified complication: Secondary | ICD-10-CM

## 2023-04-03 DIAGNOSIS — Z7985 Long-term (current) use of injectable non-insulin antidiabetic drugs: Secondary | ICD-10-CM

## 2023-04-03 DIAGNOSIS — G8929 Other chronic pain: Secondary | ICD-10-CM

## 2023-04-03 DIAGNOSIS — E669 Obesity, unspecified: Secondary | ICD-10-CM | POA: Diagnosis not present

## 2023-04-03 LAB — COMPREHENSIVE METABOLIC PANEL
ALT: 13 U/L (ref 0–53)
AST: 20 U/L (ref 0–37)
Albumin: 3.9 g/dL (ref 3.5–5.2)
Alkaline Phosphatase: 38 U/L — ABNORMAL LOW (ref 39–117)
BUN: 7 mg/dL (ref 6–23)
CO2: 25 meq/L (ref 19–32)
Calcium: 8.6 mg/dL (ref 8.4–10.5)
Chloride: 106 meq/L (ref 96–112)
Creatinine, Ser: 0.64 mg/dL (ref 0.40–1.50)
GFR: 108.98 mL/min (ref >60.00)
Glucose, Bld: 118 mg/dL — ABNORMAL HIGH (ref 70–99)
Potassium: 3.7 meq/L (ref 3.5–5.1)
Sodium: 141 meq/L (ref 135–145)
Total Bilirubin: 0.4 mg/dL (ref 0.2–1.2)
Total Protein: 7 g/dL (ref 6.0–8.3)

## 2023-04-03 LAB — HEMOGLOBIN A1C: Hgb A1c MFr Bld: 5.5 % (ref 4.6–6.5)

## 2023-04-03 LAB — LIPID PANEL
Cholesterol: 186 mg/dL (ref 0–200)
HDL: 44.1 mg/dL (ref >39.00)
LDL Cholesterol: 99 mg/dL (ref 0–99)
NonHDL: 141.98
Total CHOL/HDL Ratio: 4
Triglycerides: 213 mg/dL — ABNORMAL HIGH (ref 0.0–149.0)
VLDL: 42.6 mg/dL — ABNORMAL HIGH (ref 0.0–40.0)

## 2023-04-03 MED ORDER — SILDENAFIL CITRATE 100 MG PO TABS: 50.0000 mg | ORAL_TABLET | Freq: Every day | ORAL | 2 refills | Status: DC | PRN

## 2023-04-03 MED ORDER — TRAMADOL HCL 50 MG PO TABS
100.0000 mg | ORAL_TABLET | Freq: Two times a day (BID) | ORAL | 5 refills | Status: DC | PRN
  Filled 2023-04-03: qty 30, 7d supply, fill #0
  Filled 2023-04-10: qty 30, 7d supply, fill #1
  Filled 2023-04-17: qty 30, 7d supply, fill #2
  Filled 2023-04-24: qty 30, 7d supply, fill #3
  Filled 2023-04-30: qty 30, 7d supply, fill #4
  Filled 2023-05-07: qty 30, 7d supply, fill #5
  Filled 2023-05-14: qty 120, 30d supply, fill #6
  Filled 2023-06-13: qty 120, 30d supply, fill #7
  Filled 2023-07-11: qty 120, 30d supply, fill #8
  Filled 2023-08-03: qty 120, 30d supply, fill #9

## 2023-04-03 MED ORDER — DOXEPIN HCL 10 MG/ML PO CONC
6.0000 mg | Freq: Every day | ORAL | 12 refills | Status: AC
  Filled 2023-04-03: qty 120, 200d supply, fill #0
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
## 2023-04-03 NOTE — Patient Instructions (Signed)
Give [place] 2-3 business days to get the results of your labs back.   Keep the diet clean and stay active.  Ask the pharmacy to run the sildenafil/Viagra through GoodRx.   Let us know if you need anything.

## 2023-04-04 ENCOUNTER — Other Ambulatory Visit: Payer: Self-pay

## 2023-04-10 ENCOUNTER — Other Ambulatory Visit (HOSPITAL_BASED_OUTPATIENT_CLINIC_OR_DEPARTMENT_OTHER): Payer: Self-pay

## 2023-04-17 ENCOUNTER — Other Ambulatory Visit (HOSPITAL_BASED_OUTPATIENT_CLINIC_OR_DEPARTMENT_OTHER): Payer: Self-pay

## 2023-04-24 ENCOUNTER — Other Ambulatory Visit (HOSPITAL_BASED_OUTPATIENT_CLINIC_OR_DEPARTMENT_OTHER): Payer: Self-pay

## 2023-04-30 ENCOUNTER — Other Ambulatory Visit (HOSPITAL_BASED_OUTPATIENT_CLINIC_OR_DEPARTMENT_OTHER): Payer: Self-pay

## 2023-05-07 ENCOUNTER — Other Ambulatory Visit (HOSPITAL_BASED_OUTPATIENT_CLINIC_OR_DEPARTMENT_OTHER): Payer: Self-pay

## 2023-05-14 ENCOUNTER — Other Ambulatory Visit (HOSPITAL_BASED_OUTPATIENT_CLINIC_OR_DEPARTMENT_OTHER): Payer: Self-pay

## 2023-05-25 ENCOUNTER — Other Ambulatory Visit (HOSPITAL_BASED_OUTPATIENT_CLINIC_OR_DEPARTMENT_OTHER): Payer: Self-pay

## 2023-06-04 ENCOUNTER — Other Ambulatory Visit (HOSPITAL_BASED_OUTPATIENT_CLINIC_OR_DEPARTMENT_OTHER): Payer: Self-pay

## 2023-06-05 ENCOUNTER — Telehealth: Payer: Self-pay | Admitting: Emergency Medicine

## 2023-06-05 ENCOUNTER — Other Ambulatory Visit (HOSPITAL_BASED_OUTPATIENT_CLINIC_OR_DEPARTMENT_OTHER): Payer: Self-pay

## 2023-06-05 NOTE — Telephone Encounter (Signed)
 This is probably better coming from his specialist who deals with his back. I am not a good judge of this.

## 2023-06-05 NOTE — Telephone Encounter (Signed)
 Copied from CRM [id number]. Topic: General - Other >> Jun 05, 2023  1:46 PM Truddie Crumble wrote: Reason for CRM: patient called stating he is still not able to work and the doctor was suppose to give him another work note. Patient states he wants to come and pick it up today to give to the insurance people and social services.

## 2023-06-05 NOTE — Telephone Encounter (Signed)
 Called patient and no answer and could not leave a message due to vm full

## 2023-06-06 NOTE — Telephone Encounter (Signed)
Patient was advised and verbalized understanding. 

## 2023-06-06 NOTE — Telephone Encounter (Signed)
 Patient returned call and was advised. He stated that he just need the same note from 06/2022 and when he was in the office at his last visit in 03/2023 Dr. Carmelia Roller was going to rewrite the letter for him. Patient wants to know if he could still have that letter.

## 2023-06-06 NOTE — Telephone Encounter (Signed)
 I do not do this anymore unfortunately.

## 2023-06-11 ENCOUNTER — Ambulatory Visit (INDEPENDENT_AMBULATORY_CARE_PROVIDER_SITE_OTHER): Admitting: Family Medicine

## 2023-06-11 ENCOUNTER — Encounter: Payer: Self-pay | Admitting: Family Medicine

## 2023-06-11 VITALS — BP 146/98 | HR 88 | Ht 75.0 in | Wt 298.0 lb

## 2023-06-11 DIAGNOSIS — E669 Obesity, unspecified: Secondary | ICD-10-CM | POA: Diagnosis not present

## 2023-06-11 DIAGNOSIS — E1169 Type 2 diabetes mellitus with other specified complication: Secondary | ICD-10-CM

## 2023-06-11 DIAGNOSIS — Z7985 Long-term (current) use of injectable non-insulin antidiabetic drugs: Secondary | ICD-10-CM | POA: Diagnosis not present

## 2023-06-11 DIAGNOSIS — Z7409 Other reduced mobility: Secondary | ICD-10-CM | POA: Diagnosis not present
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
## 2023-06-11 NOTE — Patient Instructions (Addendum)
If you do not hear anything about your referrals in the next 1-2 weeks, call our office and ask for an update.  Let us know if you need anything.  

## 2023-06-12 ENCOUNTER — Other Ambulatory Visit (HOSPITAL_BASED_OUTPATIENT_CLINIC_OR_DEPARTMENT_OTHER): Payer: Self-pay

## 2023-06-13 ENCOUNTER — Other Ambulatory Visit: Payer: Self-pay | Admitting: Family Medicine

## 2023-06-13 ENCOUNTER — Other Ambulatory Visit: Payer: Self-pay

## 2023-06-13 ENCOUNTER — Other Ambulatory Visit (HOSPITAL_BASED_OUTPATIENT_CLINIC_OR_DEPARTMENT_OTHER): Payer: Self-pay

## 2023-06-13 DIAGNOSIS — I10 Essential (primary) hypertension: Secondary | ICD-10-CM

## 2023-06-13 MED ORDER — LISINOPRIL-HYDROCHLOROTHIAZIDE 20-25 MG PO TABS
1.0000 | ORAL_TABLET | Freq: Every day | ORAL | 1 refills | Status: DC
  Filled 2023-06-13: qty 90, 90d supply, fill #0
  Filled 2023-08-03 – 2023-09-19 (×3): qty 90, 90d supply, fill #1

## 2023-07-05 ENCOUNTER — Ambulatory Visit

## 2023-07-06 ENCOUNTER — Other Ambulatory Visit: Payer: Self-pay | Admitting: Family Medicine

## 2023-07-06 ENCOUNTER — Other Ambulatory Visit (HOSPITAL_BASED_OUTPATIENT_CLINIC_OR_DEPARTMENT_OTHER): Payer: Self-pay

## 2023-07-06 DIAGNOSIS — N529 Male erectile dysfunction, unspecified: Secondary | ICD-10-CM

## 2023-07-06 MED ORDER — SILDENAFIL CITRATE 100 MG PO TABS
50.0000 mg | ORAL_TABLET | Freq: Every day | ORAL | 2 refills | Status: AC | PRN
  Filled 2023-07-06: qty 30, 30d supply, fill #0
  Filled 2023-09-19 – 2023-11-01 (×2): qty 30, 30d supply, fill #1
  Filled 2024-04-16: qty 30, 30d supply, fill #2

## 2023-07-09 ENCOUNTER — Other Ambulatory Visit (HOSPITAL_BASED_OUTPATIENT_CLINIC_OR_DEPARTMENT_OTHER): Payer: Self-pay

## 2023-07-11 ENCOUNTER — Other Ambulatory Visit (HOSPITAL_BASED_OUTPATIENT_CLINIC_OR_DEPARTMENT_OTHER): Payer: Self-pay

## 2023-07-11 ENCOUNTER — Other Ambulatory Visit: Payer: Self-pay

## 2023-07-17 ENCOUNTER — Ambulatory Visit

## 2023-07-27 ENCOUNTER — Telehealth: Payer: Self-pay

## 2023-07-27 ENCOUNTER — Other Ambulatory Visit (HOSPITAL_COMMUNITY): Payer: Self-pay

## 2023-07-27 NOTE — Telephone Encounter (Signed)
 Pharmacy Patient Advocate Encounter   Received notification from Patient Pharmacy that prior authorization for Mounjaro  15 is required/requested.   Insurance verification completed.   The patient is insured through [facility] .   Per test claim: The current 28 day co-pay is, $15.00.  No PA needed at this time. This test claim was processed through [facility]- copay amounts may vary at other pharmacies due to pharmacy/plan contracts, or as the patient moves through the different stages of their insurance plan.

## 2023-08-03 ENCOUNTER — Other Ambulatory Visit: Payer: Self-pay

## 2023-08-03 ENCOUNTER — Other Ambulatory Visit: Payer: Self-pay | Admitting: Family Medicine

## 2023-08-03 ENCOUNTER — Other Ambulatory Visit (HOSPITAL_BASED_OUTPATIENT_CLINIC_OR_DEPARTMENT_OTHER): Payer: Self-pay

## 2023-08-03 DIAGNOSIS — M545 Low back pain, unspecified: Secondary | ICD-10-CM

## 2023-08-03 DIAGNOSIS — E1169 Type 2 diabetes mellitus with other specified complication: Secondary | ICD-10-CM

## 2023-08-03 MED ORDER — TRAMADOL HCL 50 MG PO TABS
100.0000 mg | ORAL_TABLET | Freq: Two times a day (BID) | ORAL | 5 refills | Status: DC | PRN
  Filled 2023-08-03 (×2): qty 120, 30d supply, fill #0
  Filled 2023-08-31: qty 120, 30d supply, fill #1
  Filled 2023-09-28: qty 120, 30d supply, fill #2
  Filled 2023-10-29: qty 120, 30d supply, fill #3
  Filled 2023-11-26 (×2): qty 120, 30d supply, fill #4
  Filled 2023-12-18: qty 120, 30d supply, fill #5

## 2023-08-03 MED ORDER — PRAVASTATIN SODIUM 40 MG PO TABS
40.0000 mg | ORAL_TABLET | Freq: Every day | ORAL | 1 refills | Status: DC
  Filled 2023-08-03: qty 90, 90d supply, fill #0
  Filled 2023-08-20: qty 90, 90d supply, fill #1

## 2023-08-03 NOTE — Telephone Encounter (Signed)
 Requesting: tramadol  50mg   Contract: None UDS: None Last Visit: 04/03/23 Next Visit: 10/12/23 Last Refill: 04/03/23 #120 and 1RF   Please Advise

## 2023-08-20 ENCOUNTER — Other Ambulatory Visit (HOSPITAL_BASED_OUTPATIENT_CLINIC_OR_DEPARTMENT_OTHER): Payer: Self-pay

## 2023-08-21 ENCOUNTER — Other Ambulatory Visit: Payer: Self-pay

## 2023-08-21 ENCOUNTER — Other Ambulatory Visit (HOSPITAL_COMMUNITY): Payer: Self-pay

## 2023-08-31 ENCOUNTER — Other Ambulatory Visit: Payer: Self-pay

## 2023-08-31 ENCOUNTER — Other Ambulatory Visit (HOSPITAL_BASED_OUTPATIENT_CLINIC_OR_DEPARTMENT_OTHER): Payer: Self-pay

## 2023-09-19 ENCOUNTER — Other Ambulatory Visit (HOSPITAL_BASED_OUTPATIENT_CLINIC_OR_DEPARTMENT_OTHER): Payer: Self-pay

## 2023-09-21 ENCOUNTER — Other Ambulatory Visit (HOSPITAL_BASED_OUTPATIENT_CLINIC_OR_DEPARTMENT_OTHER): Payer: Self-pay

## 2023-09-28 ENCOUNTER — Other Ambulatory Visit (HOSPITAL_BASED_OUTPATIENT_CLINIC_OR_DEPARTMENT_OTHER): Payer: Self-pay

## 2023-10-12 ENCOUNTER — Other Ambulatory Visit: Payer: Self-pay

## 2023-10-12 ENCOUNTER — Ambulatory Visit (INDEPENDENT_AMBULATORY_CARE_PROVIDER_SITE_OTHER): Admitting: Family Medicine

## 2023-10-12 ENCOUNTER — Encounter: Payer: Self-pay | Admitting: Family Medicine

## 2023-10-12 ENCOUNTER — Ambulatory Visit: Payer: Self-pay | Admitting: Family Medicine

## 2023-10-12 ENCOUNTER — Other Ambulatory Visit (HOSPITAL_BASED_OUTPATIENT_CLINIC_OR_DEPARTMENT_OTHER): Payer: Self-pay

## 2023-10-12 VITALS — BP 134/82 | HR 99 | Temp 98.0°F | Resp 16 | Ht 75.0 in | Wt 288.0 lb

## 2023-10-12 DIAGNOSIS — E1169 Type 2 diabetes mellitus with other specified complication: Secondary | ICD-10-CM

## 2023-10-12 DIAGNOSIS — G5602 Carpal tunnel syndrome, left upper limb: Secondary | ICD-10-CM | POA: Diagnosis not present

## 2023-10-12 DIAGNOSIS — I10 Essential (primary) hypertension: Secondary | ICD-10-CM | POA: Diagnosis not present

## 2023-10-12 DIAGNOSIS — E782 Mixed hyperlipidemia: Secondary | ICD-10-CM

## 2023-10-12 DIAGNOSIS — Z79899 Other long term (current) drug therapy: Secondary | ICD-10-CM

## 2023-10-12 DIAGNOSIS — Z7985 Long-term (current) use of injectable non-insulin antidiabetic drugs: Secondary | ICD-10-CM

## 2023-10-12 DIAGNOSIS — E669 Obesity, unspecified: Secondary | ICD-10-CM | POA: Diagnosis not present

## 2023-10-12 DIAGNOSIS — Z23 Encounter for immunization: Secondary | ICD-10-CM

## 2023-10-12 DIAGNOSIS — Z6836 Body mass index (BMI) 36.0-36.9, adult: Secondary | ICD-10-CM

## 2023-10-12 LAB — LIPID PANEL
Cholesterol: 154 mg/dL (ref 0–200)
HDL: 41.9 mg/dL (ref >39.00)
LDL Cholesterol: 67 mg/dL (ref 0–99)
NonHDL: 111.61
Total CHOL/HDL Ratio: 4
Triglycerides: 223 mg/dL — ABNORMAL HIGH (ref 0.0–149.0)
VLDL: 44.6 mg/dL — ABNORMAL HIGH (ref 0.0–40.0)

## 2023-10-12 LAB — COMPREHENSIVE METABOLIC PANEL WITH GFR
ALT: 13 U/L (ref 0–53)
AST: 15 U/L (ref 0–37)
Albumin: 3.8 g/dL (ref 3.5–5.2)
Alkaline Phosphatase: 45 U/L (ref 39–117)
BUN: 5 mg/dL — ABNORMAL LOW (ref 6–23)
CO2: 25 meq/L (ref 19–32)
Calcium: 8.7 mg/dL (ref 8.4–10.5)
Chloride: 107 meq/L (ref 96–112)
Creatinine, Ser: 0.64 mg/dL (ref 0.40–1.50)
GFR: 108.58 mL/min (ref >60.00)
Glucose, Bld: 117 mg/dL — ABNORMAL HIGH (ref 70–99)
Potassium: 3.5 meq/L (ref 3.5–5.1)
Sodium: 142 meq/L (ref 135–145)
Total Bilirubin: 0.2 mg/dL (ref 0.2–1.2)
Total Protein: 7.1 g/dL (ref 6.0–8.3)

## 2023-10-12 LAB — HEMOGLOBIN A1C: Hgb A1c MFr Bld: 6.1 % (ref 4.6–6.5)

## 2023-10-12 MED ORDER — PRAVASTATIN SODIUM 40 MG PO TABS
40.0000 mg | ORAL_TABLET | Freq: Every day | ORAL | 1 refills | Status: DC
  Filled 2023-10-12 – 2023-10-29 (×2): qty 90, 90d supply, fill #0

## 2023-10-12 MED ORDER — LISINOPRIL-HYDROCHLOROTHIAZIDE 20-25 MG PO TABS
1.0000 | ORAL_TABLET | Freq: Every day | ORAL | 1 refills | Status: AC
  Filled 2023-10-12 – 2023-12-18 (×3): qty 90, 90d supply, fill #0
  Filled 2024-01-24: qty 90, 90d supply, fill #1

## 2023-10-12 NOTE — Patient Instructions (Signed)
Give us 2-3 business days to get the results of your labs back.  ° °
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
## 2023-10-13 LAB — HEPATITIS B SURFACE ANTIBODY, QUANTITATIVE: Hep B S AB Quant (Post): <5 m[IU]/mL — ABNORMAL LOW (ref >=10)

## 2023-10-29 ENCOUNTER — Other Ambulatory Visit (HOSPITAL_BASED_OUTPATIENT_CLINIC_OR_DEPARTMENT_OTHER): Payer: Self-pay

## 2023-11-01 ENCOUNTER — Other Ambulatory Visit (HOSPITAL_BASED_OUTPATIENT_CLINIC_OR_DEPARTMENT_OTHER): Payer: Self-pay

## 2023-11-19 ENCOUNTER — Other Ambulatory Visit: Payer: Self-pay

## 2023-11-19 ENCOUNTER — Other Ambulatory Visit

## 2023-11-21 ENCOUNTER — Other Ambulatory Visit

## 2023-11-21 ENCOUNTER — Ambulatory Visit: Payer: Self-pay | Admitting: Family Medicine

## 2023-11-21 ENCOUNTER — Other Ambulatory Visit (INDEPENDENT_AMBULATORY_CARE_PROVIDER_SITE_OTHER)

## 2023-11-21 DIAGNOSIS — E782 Mixed hyperlipidemia: Secondary | ICD-10-CM | POA: Diagnosis not present

## 2023-11-21 LAB — LIPID PANEL
Cholesterol: 145 mg/dL (ref 0–200)
HDL: 47.1 mg/dL (ref >39.00)
LDL Cholesterol: 80 mg/dL (ref 0–99)
NonHDL: 97.92
Total CHOL/HDL Ratio: 3
Triglycerides: 91 mg/dL (ref 0.0–149.0)
VLDL: 18.2 mg/dL (ref 0.0–40.0)

## 2023-11-26 ENCOUNTER — Other Ambulatory Visit (HOSPITAL_BASED_OUTPATIENT_CLINIC_OR_DEPARTMENT_OTHER): Payer: Self-pay

## 2023-11-26 ENCOUNTER — Other Ambulatory Visit: Payer: Self-pay | Admitting: Family Medicine

## 2023-11-26 DIAGNOSIS — E669 Obesity, unspecified: Secondary | ICD-10-CM

## 2023-11-26 MED ORDER — MOUNJARO 15 MG/0.5ML ~~LOC~~ SOAJ
15.0000 mg | SUBCUTANEOUS | 1 refills | Status: AC
  Filled 2023-11-26: qty 2, 28d supply, fill #0

## 2023-12-12 ENCOUNTER — Other Ambulatory Visit (HOSPITAL_BASED_OUTPATIENT_CLINIC_OR_DEPARTMENT_OTHER): Payer: Self-pay

## 2023-12-18 ENCOUNTER — Other Ambulatory Visit: Payer: Self-pay

## 2023-12-18 ENCOUNTER — Other Ambulatory Visit (HOSPITAL_BASED_OUTPATIENT_CLINIC_OR_DEPARTMENT_OTHER): Payer: Self-pay

## 2023-12-25 ENCOUNTER — Other Ambulatory Visit: Payer: Self-pay | Admitting: Family Medicine

## 2023-12-25 ENCOUNTER — Other Ambulatory Visit (HOSPITAL_BASED_OUTPATIENT_CLINIC_OR_DEPARTMENT_OTHER): Payer: Self-pay

## 2023-12-25 DIAGNOSIS — G8929 Other chronic pain: Secondary | ICD-10-CM

## 2023-12-25 DIAGNOSIS — E669 Obesity, unspecified: Secondary | ICD-10-CM

## 2023-12-25 MED ORDER — TRAMADOL HCL 50 MG PO TABS
100.0000 mg | ORAL_TABLET | Freq: Two times a day (BID) | ORAL | 5 refills | Status: DC | PRN
  Filled 2023-12-25: qty 120, 30d supply, fill #0
  Filled 2024-01-24 (×4): qty 120, 30d supply, fill #1

## 2023-12-25 MED ORDER — PRAVASTATIN SODIUM 40 MG PO TABS
40.0000 mg | ORAL_TABLET | Freq: Every day | ORAL | 0 refills | Status: DC
  Filled 2023-12-25 – 2024-01-24 (×3): qty 90, 90d supply, fill #0

## 2023-12-25 NOTE — Telephone Encounter (Signed)
 Requesting: tramadol  50mg   Contract: 10/12/23 UDS: Ordered on 10/12/23, was not completed by Pt  Last Visit: 10/12/23 Next Visit: None Last Refill: 08/03/23 #120 and 1RF   Please Advise

## 2023-12-25 NOTE — Telephone Encounter (Signed)
 Copied from CRM [id number]. Topic: Clinical - Medication Refill >> Dec 25, 2023 11:11 AM Shereese L wrote: Medication: pravastatin  (PRAVACHOL ) 40 MG tablet traMADol  (ULTRAM ) 50 MG tablet    Has the patient contacted their pharmacy? Yes (Agent: If no, request that the patient contact the pharmacy for the refill. If patient does not wish to contact the pharmacy document the reason why and proceed with request.) (Agent: If yes, when and what did the pharmacy advise?)  This is the patient's preferred pharmacy:  [facility] [place] [facility] [address], [address] [place] [address] Phone: [phone] Fax: [phone]  Is this the correct pharmacy for this prescription? Yes If no, delete pharmacy and type the correct one.   Has the prescription been filled recently? Yes  Is the patient out of the medication? Yes  Has the patient been seen for an appointment in the last year OR does the patient have an upcoming appointment? Yes  Can we respond through MyChart? Yes  Agent: Please be advised that Rx refills may take up to 3 business days. We ask that you follow-up with your pharmacy.

## 2023-12-31 ENCOUNTER — Other Ambulatory Visit (HOSPITAL_BASED_OUTPATIENT_CLINIC_OR_DEPARTMENT_OTHER): Payer: Self-pay

## 2023-12-31 ENCOUNTER — Other Ambulatory Visit: Payer: Self-pay | Admitting: Family Medicine

## 2024-01-24 ENCOUNTER — Other Ambulatory Visit (HOSPITAL_BASED_OUTPATIENT_CLINIC_OR_DEPARTMENT_OTHER): Payer: Self-pay

## 2024-01-24 ENCOUNTER — Telehealth: Payer: Self-pay

## 2024-01-24 ENCOUNTER — Telehealth: Payer: Self-pay | Admitting: Family Medicine

## 2024-01-24 ENCOUNTER — Other Ambulatory Visit: Payer: Self-pay

## 2024-01-24 ENCOUNTER — Encounter (HOSPITAL_BASED_OUTPATIENT_CLINIC_OR_DEPARTMENT_OTHER): Payer: Self-pay

## 2024-01-24 ENCOUNTER — Other Ambulatory Visit (HOSPITAL_COMMUNITY): Payer: Self-pay

## 2024-01-24 ENCOUNTER — Other Ambulatory Visit: Payer: Self-pay | Admitting: Family Medicine

## 2024-01-24 DIAGNOSIS — G8929 Other chronic pain: Secondary | ICD-10-CM

## 2024-01-24 MED ORDER — TRAMADOL HCL 50 MG PO TABS
100.0000 mg | ORAL_TABLET | Freq: Two times a day (BID) | ORAL | 5 refills | Status: DC | PRN
  Filled 2024-01-24: qty 120, 30d supply, fill #0
  Filled 2024-02-21: qty 120, 30d supply, fill #1
  Filled ????-??-??: fill #2

## 2024-01-24 NOTE — Telephone Encounter (Signed)
 Pt leaving today for traveling, he does have refill on fills downstairs but they are needing the okay to refill today instead of 01/28/24.

## 2024-01-24 NOTE — Telephone Encounter (Signed)
 Copied from CRM #[id number]. Topic: Clinical - Prescription Issue >> Jan 24, 2024 12:09 PM [name] ORN wrote: Reason for CRM: pt in need of his Tramadol  rx refill in the next hour due to transportation restraints. And is in need of rx prior to earliest fill date 11/17 due to upcoming travel. Please advise

## 2024-01-24 NOTE — Telephone Encounter (Signed)
 Clinical questions answered and PA submitted.

## 2024-01-24 NOTE — Telephone Encounter (Signed)
 Pharmacy Patient Advocate Encounter   Received notification from CoverMyMeds that prior authorization for Tramadol  50mg  tabs is required/requested.   Insurance verification completed.   The patient is insured through [employer].   Per test claim: PA required; PA started via CoverMyMeds. KEY [id number] . Please see clinical question(s) below that I am not finding the answer to in their chart and advise.     For the PA, the insurance is requesting a copy of a pain agreement. I could not find one in the patient's chart. Please advise.

## 2024-01-24 NOTE — Telephone Encounter (Signed)
 Scanned into media on 11/02/23

## 2024-01-25 ENCOUNTER — Other Ambulatory Visit (HOSPITAL_COMMUNITY): Payer: Self-pay

## 2024-01-25 NOTE — Telephone Encounter (Signed)
 Pharmacy Patient Advocate Encounter  Received notification from [employer] that Prior Authorization for Tramadol  50mg  tabs has been DENIED.  See denial reason below. No denial letter attached in CMM. Will attach denial letter to Media tab once received.   PA #/Case ID/Reference #: [id number]    The insurance will pay for a 7 day supply and per the test claim the copay is $0.

## 2024-01-25 NOTE — Telephone Encounter (Signed)
 We have one from 11/02/23.

## 2024-01-28 ENCOUNTER — Other Ambulatory Visit: Payer: Self-pay

## 2024-01-28 DIAGNOSIS — G8929 Other chronic pain: Secondary | ICD-10-CM

## 2024-01-28 DIAGNOSIS — M545 Low back pain, unspecified: Secondary | ICD-10-CM

## 2024-01-28 NOTE — Telephone Encounter (Signed)
 I uploaded the pain contract with documentation for the PA for Tramadol  and it was denied.   Per the denial letter:

## 2024-01-28 NOTE — Telephone Encounter (Signed)
 Oh, so he needs to see a specialist. OK to refer to pain management if he is OK with it.

## 2024-02-04 ENCOUNTER — Ambulatory Visit: Admitting: Family Medicine

## 2024-02-08 ENCOUNTER — Other Ambulatory Visit (HOSPITAL_BASED_OUTPATIENT_CLINIC_OR_DEPARTMENT_OTHER): Payer: Self-pay

## 2024-02-11 ENCOUNTER — Ambulatory Visit: Admitting: Family Medicine

## 2024-02-12 ENCOUNTER — Other Ambulatory Visit (HOSPITAL_COMMUNITY): Payer: Self-pay

## 2024-02-12 ENCOUNTER — Other Ambulatory Visit (HOSPITAL_BASED_OUTPATIENT_CLINIC_OR_DEPARTMENT_OTHER): Payer: Self-pay

## 2024-02-12 MED ORDER — MULTI-VITAMIN PO TABS
1.0000 | ORAL_TABLET | Freq: Every day | ORAL | 0 refills | Status: AC
  Filled 2024-02-12: qty 130, 130d supply, fill #0

## 2024-02-12 MED ORDER — VITAMIN B-1 100 MG PO TABS
100.0000 mg | ORAL_TABLET | Freq: Every day | ORAL | 0 refills | Status: AC
  Filled 2024-02-12: qty 100, 100d supply, fill #0

## 2024-02-12 MED ORDER — NICOTINE 21 MG/24HR TD PT24
MEDICATED_PATCH | TRANSDERMAL | 0 refills | Status: AC
  Filled 2024-02-12: qty 28, 28d supply, fill #0

## 2024-02-12 MED ORDER — LISINOPRIL-HYDROCHLOROTHIAZIDE 20-25 MG PO TABS
1.0000 | ORAL_TABLET | Freq: Every day | ORAL | 0 refills | Status: AC
  Filled 2024-04-16: qty 90, 90d supply, fill #0

## 2024-02-12 MED ORDER — MAGNESIUM OXIDE 400 MG PO TABS
ORAL_TABLET | ORAL | 0 refills | Status: AC
  Filled 2024-02-12: qty 120, 60d supply, fill #0

## 2024-02-12 MED ORDER — ACETAMINOPHEN 325 MG PO TABS
650.0000 mg | ORAL_TABLET | Freq: Four times a day (QID) | ORAL | 0 refills | Status: AC | PRN
  Filled 2024-02-12: qty 100, 13d supply, fill #0

## 2024-02-12 MED ORDER — AMLODIPINE BESYLATE 5 MG PO TABS
5.0000 mg | ORAL_TABLET | Freq: Every day | ORAL | 0 refills | Status: AC
  Filled 2024-02-12: qty 90, 90d supply, fill #0

## 2024-02-12 MED ORDER — FOLIC ACID 1 MG PO TABS
1.0000 mg | ORAL_TABLET | Freq: Every day | ORAL | 0 refills | Status: AC
  Filled 2024-02-12: qty 90, 90d supply, fill #0

## 2024-02-12 MED ORDER — ONDANSETRON 4 MG PO TBDP
ORAL_TABLET | ORAL | 0 refills | Status: AC
  Filled 2024-02-12: qty 20, 5d supply, fill #0

## 2024-02-12 MED ORDER — VITAMIN B-12 1000 MCG PO TABS
1000.0000 ug | ORAL_TABLET | Freq: Every day | ORAL | 0 refills | Status: AC
  Filled 2024-02-12: qty 100, 100d supply, fill #0

## 2024-02-13 ENCOUNTER — Other Ambulatory Visit (HOSPITAL_BASED_OUTPATIENT_CLINIC_OR_DEPARTMENT_OTHER): Payer: Self-pay

## 2024-02-13 ENCOUNTER — Telehealth: Payer: Self-pay

## 2024-02-13 NOTE — Transitions of Care (Post Inpatient/ED Visit) (Unsigned)
   02/13/2024  Name: Mark Rich MRN: [id number] DOB: October 27, 1970  Today's TOC FU Call Status: Today's TOC FU Call Status:: Unsuccessful Call (1st Attempt) Unsuccessful Call (1st Attempt) Date: 02/13/24  Attempted to reach the patient regarding the most recent Inpatient/ED visit.  Follow Up Plan: Additional outreach attempts will be made to reach the patient to complete the Transitions of Care (Post Inpatient/ED visit) call.   Signature Julian Lemmings, LPN [facility] Nurse Health Advisor Direct Dial [phone]

## 2024-02-14 ENCOUNTER — Other Ambulatory Visit (HOSPITAL_BASED_OUTPATIENT_CLINIC_OR_DEPARTMENT_OTHER): Payer: Self-pay

## 2024-02-14 NOTE — Transitions of Care (Post Inpatient/ED Visit) (Signed)
   02/14/2024  Name: Mark Rich MRN: [id number] DOB: 21-Dec-1970  Today's TOC FU Call Status: Today's TOC FU Call Status:: Unsuccessful Call (3rd Attempt) Unsuccessful Call (1st Attempt) Date: 02/13/24 Unsuccessful Call (3rd Attempt) Date: 02/14/24  Attempted to reach the patient regarding the most recent Inpatient/ED visit.  Follow Up Plan: No further outreach attempts will be made at this time. We have been unable to contact the patient.  Signature Julian Lemmings, LPN [facility] Nurse Health Advisor Direct Dial [phone]

## 2024-02-14 NOTE — Telephone Encounter (Signed)
 Copied from CRM #[id number]. Topic: General - Other >> Feb 14, 2024 10:37 AM Franky GRADE wrote: Reason for CRM: Patient is returning a call he received from [facility]. Tried to reach out but no response. Patient states he will be on an important phone call at 45 but will be available anytime afterwards.

## 2024-02-19 ENCOUNTER — Other Ambulatory Visit (HOSPITAL_BASED_OUTPATIENT_CLINIC_OR_DEPARTMENT_OTHER): Payer: Self-pay

## 2024-02-21 ENCOUNTER — Other Ambulatory Visit (HOSPITAL_COMMUNITY): Payer: Self-pay

## 2024-02-21 ENCOUNTER — Other Ambulatory Visit: Payer: Self-pay

## 2024-02-21 ENCOUNTER — Other Ambulatory Visit (HOSPITAL_BASED_OUTPATIENT_CLINIC_OR_DEPARTMENT_OTHER): Payer: Self-pay

## 2024-03-18 ENCOUNTER — Other Ambulatory Visit (HOSPITAL_BASED_OUTPATIENT_CLINIC_OR_DEPARTMENT_OTHER): Payer: Self-pay

## 2024-03-20 ENCOUNTER — Other Ambulatory Visit (HOSPITAL_BASED_OUTPATIENT_CLINIC_OR_DEPARTMENT_OTHER): Payer: Self-pay

## 2024-03-20 ENCOUNTER — Other Ambulatory Visit: Payer: Self-pay | Admitting: Family Medicine

## 2024-03-20 ENCOUNTER — Ambulatory Visit: Payer: Self-pay

## 2024-03-20 DIAGNOSIS — G8929 Other chronic pain: Secondary | ICD-10-CM

## 2024-03-20 MED ORDER — TRAMADOL HCL 50 MG PO TABS
100.0000 mg | ORAL_TABLET | Freq: Two times a day (BID) | ORAL | 5 refills | Status: AC | PRN
  Filled 2024-03-20: qty 120, 30d supply, fill #0
  Filled 2024-04-17: qty 120, 30d supply, fill #1

## 2024-03-20 NOTE — Telephone Encounter (Signed)
 Spoke w/ [facility] pharmacy- informed that okay to release tramadol  early per Dr. Frann. Spoke w/ Pt- informed that med should be ready at pharmacy shortly.

## 2024-03-20 NOTE — Telephone Encounter (Signed)
 FYI Only or Action Required?: FYI only for provider: ED advised.  Patient was last seen in primary care on 10/12/2023 by Frann Mabel Mt, DO.  Called Nurse Triage reporting Foot Swelling and Back Pain.  Symptoms began several weeks ago.  Interventions attempted: Prescription medications: Tramadol .  Symptoms are: gradually worsening.  Triage Disposition: Go to ED Now (Notify PCP)  Patient/caregiver understands and will follow disposition?: No, refuses disposition  Reason for Disposition  [1] Loss of bladder or bowel control (urine or bowel incontinence; wetting self, leaking stool) AND [2] new-onset  Answer Assessment - Initial Assessment Questions Patient states that he has chronic lower back pain, but has recently started to experience urinary incontinence and not having regular bowel movements. ED advised based on these symptoms, but patient states that he was told by another physician to make an appt with his PCP. He is calling today to make an appt and would like to have his Tramadol  prescription changed to today for transportation reasons.   1. ONSET: When did the pain begin? (e.g., minutes, hours, days)     Chronic back pain  2. LOCATION: Where does it hurt? (upper, mid or lower back)     Lower back  3. SEVERITY: How bad is the pain?  (e.g., Scale 1-10; mild, moderate, or severe)     Moderate  4. PATTERN: Is the pain constant? (e.g., yes, no; constant, intermittent)      Yes  5. RADIATION: Does the pain shoot into your legs or somewhere else?     Unknown  6. CAUSE:  What do you think is causing the back pain?      Patient has chronic back pain  7. BACK OVERUSE:  Any recent lifting of heavy objects, strenuous work or exercise?     Unknown  8. MEDICINES: What have you taken so far for the pain? (e.g., nothing, acetaminophen , NSAIDS)     Recently received pain injection  9. NEUROLOGIC SYMPTOMS: Do you have any weakness, numbness, or problems  with bowel/bladder control?     Loss of bladder control and irregular bowel movements-new symptoms  10. OTHER SYMPTOMS: Do you have any other symptoms? (e.g., fever, abdomen pain, burning with urination, blood in urine)       Denies any other symptoms  11. PREGNANCY: Is there any chance you are pregnant? When was your last menstrual period?       NA  Protocols used: Back Pain-A-AH  Copied from CRM [id number]. Topic: Clinical - Red Word Triage >> Mar 20, 2024 11:44 AM Antwanette L wrote: Red Word that prompted transfer to Nurse Triage: The patient was informed by the pharmacy that traMADol  (Ultram ) 50 mg tablets would be ready for pickup on 1/11. The patient wants to pick up the medication today, but the pharmacy advised him to contact the provider's office. The patient reports back pain and swelling in both feet.

## 2024-03-21 ENCOUNTER — Other Ambulatory Visit (HOSPITAL_BASED_OUTPATIENT_CLINIC_OR_DEPARTMENT_OTHER): Payer: Self-pay

## 2024-04-16 ENCOUNTER — Other Ambulatory Visit: Payer: Self-pay

## 2024-04-16 ENCOUNTER — Other Ambulatory Visit: Payer: Self-pay | Admitting: Family Medicine

## 2024-04-16 ENCOUNTER — Other Ambulatory Visit (HOSPITAL_BASED_OUTPATIENT_CLINIC_OR_DEPARTMENT_OTHER): Payer: Self-pay

## 2024-04-16 DIAGNOSIS — E669 Obesity, unspecified: Secondary | ICD-10-CM

## 2024-04-16 MED ORDER — PRAVASTATIN SODIUM 40 MG PO TABS
40.0000 mg | ORAL_TABLET | Freq: Every day | ORAL | 0 refills | Status: AC
  Filled 2024-04-16: qty 90, 90d supply, fill #0

## 2024-04-17 ENCOUNTER — Other Ambulatory Visit (HOSPITAL_BASED_OUTPATIENT_CLINIC_OR_DEPARTMENT_OTHER): Payer: Self-pay
